# Patient Record
Sex: Female | Born: 1975 | Race: White | Hispanic: No | Marital: Married | Smoking: Never smoker
Health system: Southern US, Community
[De-identification: ages and names within clinical notes are randomized; demographics above are authoritative.]

## PROBLEM LIST (undated history)

## (undated) DIAGNOSIS — E669 Obesity, unspecified: Secondary | ICD-10-CM

## (undated) DIAGNOSIS — F419 Anxiety disorder, unspecified: Secondary | ICD-10-CM

## (undated) DIAGNOSIS — E282 Polycystic ovarian syndrome: Secondary | ICD-10-CM

## (undated) DIAGNOSIS — J45909 Unspecified asthma, uncomplicated: Secondary | ICD-10-CM

## (undated) DIAGNOSIS — I10 Essential (primary) hypertension: Secondary | ICD-10-CM

## (undated) DIAGNOSIS — E119 Type 2 diabetes mellitus without complications: Secondary | ICD-10-CM

## (undated) DIAGNOSIS — T7840XA Allergy, unspecified, initial encounter: Secondary | ICD-10-CM

## (undated) DIAGNOSIS — G4700 Insomnia, unspecified: Secondary | ICD-10-CM

## (undated) DIAGNOSIS — J329 Chronic sinusitis, unspecified: Secondary | ICD-10-CM

## (undated) HISTORY — DX: Morbid (severe) obesity due to excess calories: E66.01

## (undated) HISTORY — DX: Obesity, unspecified: E66.9

## (undated) HISTORY — DX: Polycystic ovarian syndrome: E28.2

## (undated) HISTORY — DX: Insomnia, unspecified: G47.00

## (undated) HISTORY — DX: Essential (primary) hypertension: I10

## (undated) HISTORY — DX: Chronic sinusitis, unspecified: J32.9

## (undated) HISTORY — PX: CHOLECYSTECTOMY: SHX55

## (undated) HISTORY — DX: Unspecified asthma, uncomplicated: J45.909

## (undated) HISTORY — DX: Type 2 diabetes mellitus without complications: E11.9

## (undated) HISTORY — DX: Anxiety disorder, unspecified: F41.9

## (undated) HISTORY — DX: Allergy, unspecified, initial encounter: T78.40XA

---

## 1997-10-25 ENCOUNTER — Other Ambulatory Visit: Admission: RE | Admit: 1997-10-25 | Discharge: 1997-10-25 | Payer: Self-pay | Admitting: Family Medicine

## 1997-10-29 ENCOUNTER — Other Ambulatory Visit: Admission: RE | Admit: 1997-10-29 | Discharge: 1997-10-29 | Payer: Self-pay | Admitting: Family Medicine

## 2000-11-14 ENCOUNTER — Encounter: Admission: RE | Admit: 2000-11-14 | Discharge: 2000-11-14 | Payer: Self-pay | Admitting: Family Medicine

## 2000-11-14 ENCOUNTER — Encounter: Payer: Self-pay | Admitting: Family Medicine

## 2001-10-09 ENCOUNTER — Other Ambulatory Visit: Admission: RE | Admit: 2001-10-09 | Discharge: 2001-10-09 | Payer: Self-pay | Admitting: Obstetrics and Gynecology

## 2002-11-03 ENCOUNTER — Other Ambulatory Visit: Admission: RE | Admit: 2002-11-03 | Discharge: 2002-11-03 | Payer: Self-pay | Admitting: Obstetrics and Gynecology

## 2005-10-31 ENCOUNTER — Other Ambulatory Visit: Admission: RE | Admit: 2005-10-31 | Discharge: 2005-10-31 | Payer: Self-pay | Admitting: Family Medicine

## 2007-12-25 ENCOUNTER — Other Ambulatory Visit: Admission: RE | Admit: 2007-12-25 | Discharge: 2007-12-25 | Payer: Self-pay | Admitting: Family Medicine

## 2011-03-06 ENCOUNTER — Other Ambulatory Visit: Payer: Self-pay | Admitting: Family Medicine

## 2011-03-06 DIAGNOSIS — Z1231 Encounter for screening mammogram for malignant neoplasm of breast: Secondary | ICD-10-CM

## 2011-04-02 ENCOUNTER — Ambulatory Visit: Payer: Self-pay

## 2013-02-18 ENCOUNTER — Other Ambulatory Visit: Payer: Self-pay | Admitting: Family Medicine

## 2013-02-18 ENCOUNTER — Other Ambulatory Visit (HOSPITAL_COMMUNITY)
Admission: RE | Admit: 2013-02-18 | Discharge: 2013-02-18 | Disposition: A | Discharge status: Home / Self Care | Payer: BC Managed Care – PPO | Source: Ambulatory Visit | Attending: Family Medicine | Admitting: Family Medicine

## 2013-02-18 DIAGNOSIS — Z0000 Encounter for general adult medical examination without abnormal findings: Secondary | ICD-10-CM | POA: Insufficient documentation

## 2014-06-08 ENCOUNTER — Ambulatory Visit
Admission: RE | Admit: 2014-06-08 | Discharge: 2014-06-08 | Disposition: A | Discharge status: Home / Self Care | Payer: BC Managed Care – PPO | Source: Ambulatory Visit | Attending: Family Medicine | Admitting: Family Medicine

## 2014-06-08 ENCOUNTER — Other Ambulatory Visit: Payer: Self-pay | Admitting: Family Medicine

## 2014-06-08 DIAGNOSIS — J189 Pneumonia, unspecified organism: Secondary | ICD-10-CM

## 2014-10-22 LAB — HM DIABETES EYE EXAM

## 2015-05-04 ENCOUNTER — Encounter: Payer: Self-pay | Admitting: Internal Medicine

## 2015-06-06 ENCOUNTER — Encounter: Payer: Self-pay | Admitting: Internal Medicine

## 2015-06-07 ENCOUNTER — Other Ambulatory Visit (INDEPENDENT_AMBULATORY_CARE_PROVIDER_SITE_OTHER): Payer: BC Managed Care – PPO | Admitting: *Deleted

## 2015-06-07 ENCOUNTER — Other Ambulatory Visit: Payer: Self-pay | Admitting: *Deleted

## 2015-06-07 ENCOUNTER — Ambulatory Visit (INDEPENDENT_AMBULATORY_CARE_PROVIDER_SITE_OTHER): Payer: BC Managed Care – PPO | Admitting: Internal Medicine

## 2015-06-07 ENCOUNTER — Encounter: Payer: Self-pay | Admitting: Internal Medicine

## 2015-06-07 VITALS — BP 122/88 | HR 99 | Temp 98.3°F | Resp 12 | Ht 65.5 in | Wt 343.0 lb

## 2015-06-07 DIAGNOSIS — E1165 Type 2 diabetes mellitus with hyperglycemia: Secondary | ICD-10-CM

## 2015-06-07 DIAGNOSIS — Z794 Long term (current) use of insulin: Secondary | ICD-10-CM | POA: Insufficient documentation

## 2015-06-07 LAB — POCT GLYCOSYLATED HEMOGLOBIN (HGB A1C): HEMOGLOBIN A1C: 9.3

## 2015-06-07 MED ORDER — GLUCOSE BLOOD VI STRP: ORAL_STRIP | Status: DC

## 2015-06-07 MED ORDER — BASAGLAR KWIKPEN 100 UNIT/ML ~~LOC~~ SOPN: 18.0000 [IU] | PEN_INJECTOR | Freq: Every day | SUBCUTANEOUS | Status: DC

## 2015-06-07 MED ORDER — BAYER MICROLET LANCETS MISC
Status: DC
Start: 2015-06-07 — End: 2015-06-07

## 2015-06-07 NOTE — Progress Notes (Signed)
Patient ID: Vicki Rivera, female   DOB: Aug 08, 1975, 40 y.o.   MRN: [id number]  HPI: Vicki Rivera is a 40 y.o.-year-old female, referred by her PCP, Dr.Smith, for management of DM2, dx in 2015, non-insulin-dependent, uncontrolled, without complications.  Last hemoglobin A1c was: 12/21/2014: 7.1%  Pt is on a regimen of: - Trulicity 0.75 mcg every other week  She tried Victoza >> N/V/AP and pancreatitis reportedly She tried Trulicity 0.75 mg weekly >> N/V She tried Invokana >> AP and severe nausea. She tried Metformin >> severe N/AP/D  Pt checks her sugars 1x a day and they are: - am: 180-220 - 2h after b'fast: n/c - before lunch: n/c - 2h after lunch: n/c - before dinner: n/c - 2h after dinner: n/c - bedtime: n/c - nighttime: n/c No lows. Lowest sugar was 130; she has hypoglycemia awareness at 130. Highest sugar was 230.  Glucometer: Freestyle  Pt's meals are - low dairy, no meat, low caffeine (green tea), no sodas - plant based: - Breakfast: whole oats + sugar + fruit - Lunch: rice + beans or potatoes + cheese, greens - Dinner: rice + beans or potatoes + cheese, greens or tofu + veggies - Snacks: apple sauce, fruit, potato chips in peanut oil  - no CKD, last BUN/creatinine:  No results found for: BUN, CREATININE  On Lisinopril. - last set of lipids: No results found for: CHOL, HDL, LDLCALC, LDLDIRECT, TRIG, CHOLHDL  On Crestor.  - last eye exam was in 10/22/2014. No DR.  - no numbness and tingling in her feet.  Pt has FH of DM in mother, sister, MGM.  She also has HTN, PCOS.  ROS: Constitutional: + weight gain, + fatigue, + subjective hyperthermia, + excessive urination Eyes: + blurry vision, no xerophthalmia ENT: no sore throat, no nodules palpated in throat, + occasional dysphagia/no odynophagia, no hoarseness Cardiovascular: no CP/+ SOB/no palpitations/+ leg swelling Respiratory: + all: cough/SOB/wheezing Gastrointestinal: + N/+ V/no D/C Musculoskeletal: + both:  muscle/joint aches Skin: + rash, + easy bruising, + itching, + excessive hair growth Neurological: no tremors/numbness/tingling/dizziness Psychiatric: + both: depression/anxiety + low libido  Past Medical History  Diagnosis Date  . Allergy   . Asthma   . PCOS (polycystic ovarian syndrome)   . Obesity   . Hypertension   . Anxiety   . Insomnia   . Diabetes mellitus without complication (HCC)     DM Type II   . Sinusitis    Past Surgical History  Procedure Laterality Date  . Cholecystectomy     Social History   Social History  . Marital Status: Single    Spouse Name: N/A  . Number of Children: 0   Social History Main Topics  . Smoking status: Never Smoker   . Smokeless tobacco: Not on file  . Alcohol Use: 0.0 oz/week    0 Standard drinks or equivalent per week  . Drug Use: Not on file   Social History Narrative   Single (female partner-8 yrs)   0 children   Exercise- 2 times weekly   Caffeine use - 3-4 servings daily   College Graduate - [address] [employer] ([employer])    Occup - [employer]   Current Outpatient Prescriptions on File Prior to Visit  Medication Sig Dispense Refill  . albuterol (VENTOLIN HFA) 108 (90 Base) MCG/ACT inhaler Inhale 2 puffs into the lungs every 6 (six) hours as needed.     . desvenlafaxine (PRISTIQ) 100 MG 24 hr tablet  Take 100 mg by mouth daily.     . Dulaglutide (TRULICITY) 0.75 MG/0.5ML SOPN Inject 0.5 mg into the skin once a week.     . fluticasone (FLONASE) 50 MCG/ACT nasal spray Place 2 sprays into both nostrils daily.     . Fluticasone-Salmeterol (ADVAIR DISKUS) 100-50 MCG/DOSE AEPB Inhale 1 puff into the lungs.     Marland Kitchen glucose blood (FREESTYLE INSULINX TEST) test strip 1 each by Other route 2 (two) times daily.     . Insulin Pen Needle (BD PEN NEEDLE NANO U/F) 32G X 4 MM MISC Use to inject insulin.    . Lancets (FREESTYLE) lancets 1 each by Other route 2 (two) times daily.     Marland Kitchen  levocetirizine (XYZAL) 5 MG tablet Take 5 mg by mouth every evening.     Marland Kitchen lisinopril-hydrochlorothiazide (PRINZIDE,ZESTORETIC) 10-12.5 MG tablet Take 1 tablet by mouth daily.     . montelukast (SINGULAIR) 5 MG chewable tablet Chew 10 mg by mouth at bedtime.     . rosuvastatin (CRESTOR) 20 MG tablet Take 20 mg by mouth daily.      No current facility-administered medications on file prior to visit.   Allergies  Allergen Reactions  . Amoxicillin-Pot Clavulanate Hives  . Benzonatate Other (See Comments)  . Canagliflozin Nausea Only  . Ciprofloxacin Hives  . Clarithromycin Other (See Comments)  . Ceftin  [Cefuroxime] Hives  . Emetrol Nausea Only  . Metformin Nausea Only  . Sulfa Antibiotics Hives   Family History  Problem Relation Age of Onset  . Hypertension Mother   . Diabetes Mother   . Hyperlipidemia Mother   . Parkinson's disease Mother   . Hyperlipidemia Father   . Diabetes Sister     TYPE I   . Diabetes Maternal Grandmother   . Dementia Maternal Grandfather   . Heart disease Paternal Grandfather     CAD; MI    PE: BP 122/88 mmHg  Pulse 99  Temp(Src) 98.3 F (36.8 C) (Oral)  Resp 12  Ht 5' 5.5" (1.664 m)  Wt 343 lb (155.584 kg)  BMI 56.19 kg/m2  SpO2 98% Wt Readings from Last 3 Encounters:  06/07/15 343 lb (155.584 kg)   Constitutional: Obesity class 3, in NAD Eyes: PERRLA, EOMI, no exophthalmos ENT: moist mucous membranes, no thyromegaly, no cervical lymphadenopathy Cardiovascular: RRR, No MRG Respiratory: CTA B Gastrointestinal: abdomen soft, NT, ND, BS+ Musculoskeletal: no deformities, strength intact in all 4 Skin: moist, warm, no rashes Neurological: no tremor with outstretched hands, DTR normal in all 4  ASSESSMENT: 1. DM2, insulin-dependent, uncontrolled, without complications  PLAN:  1. Patient with long-standing, uncontrolled diabetes, on a low dose GLP1 R agonist every 2 weeks. She has had a lot of intolerances to DM meds over 6 mo and her DM  control has deteriorated in thie period- we checked HbA1c >> 9.3% (higher). Her diet has improved in last year (antiinflammatory diet - LEAP), but sugars in am (she only checks then) are 180 (with Trulicity)-220 (without Trulicity). Due to her many medication intolerances and the fact that we need to avoid any further insult of her GI tract, I suggested to start basal insulin, without mealtime coverage for now, since I suspect that her sugars stay high throughout the rest of the day, but without a staircase effect. - I suggested to:  Patient Instructions  Please stop Trulicity.  Start Basaglar 18 units at bedtime.  If sugars in 5 days not lower than 150, you can increase the dose  to 22 units. If sugars in 5 days not lower than 150, you can increase the dose to 26 units. If sugars in 5 days not lower than 150, you can increase the dose to 30 units.  Check sugars 2x a day, rotating check times.  Please return in 1.5 months with your sugar log.   - Strongly advised her to start checking sugars at different times of the day - check 2 times a day, rotating checks - we gave her a Bayer Contour meter - given sugar log and advised how to fill it and to bring it at next appt  - given foot care handout and explained the principles  - given instructions for hypoglycemia management "15-15 rule"  - advised for yearly eye exams  - Return to clinic in 1.5 mo with sugar log

## 2015-06-07 NOTE — Patient Instructions (Signed)
Please stop Trulicity.  Start Basaglar 18 units at bedtime.  If sugars in 5 days not lower than 150, you can increase the dose to 22 units. If sugars in 5 days not lower than 150, you can increase the dose to 26 units. If sugars in 5 days not lower than 150, you can increase the dose to 30 units.  Check sugars 2x a day, rotating check times.  Please return in 1.5 months with your sugar log.   PATIENT INSTRUCTIONS FOR TYPE 2 DIABETES:  **Please join MyChart!** - see attached instructions about how to join if you have not done so already.  DIET AND EXERCISE Diet and exercise is an important part of diabetic treatment.  We recommended aerobic exercise in the form of brisk walking (working between 40-60% of maximal aerobic capacity, similar to brisk walking) for 150 minutes per week (such as 30 minutes five days per week) along with 3 times per week performing 'resistance' training (using various gauge rubber tubes with handles) 5-10 exercises involving the major muscle groups (upper body, lower body and core) performing 10-15 repetitions (or near fatigue) each exercise. Start at half the above goal but build slowly to reach the above goals. If limited by weight, joint pain, or disability, we recommend daily walking in a swimming pool with water up to waist to reduce pressure from joints while allow for adequate exercise.    BLOOD GLUCOSES Monitoring your blood glucoses is important for continued management of your diabetes. Please check your blood glucoses 2-4 times a day: fasting, before meals and at bedtime (you can rotate these measurements - e.g. one day check before the 3 meals, the next day check before 2 of the meals and before bedtime, etc.).   HYPOGLYCEMIA (low blood sugar) Hypoglycemia is usually a reaction to not eating, exercising, or taking too much insulin/ other diabetes drugs.  Symptoms include tremors, sweating, hunger, confusion, headache, etc. Treat IMMEDIATELY with 15 grams  of Carbs: . 4 glucose tablets .  cup regular juice/soda . 2 tablespoons raisins . 4 teaspoons sugar . 1 tablespoon honey Recheck blood glucose in 15 mins and repeat above if still symptomatic/blood glucose <100.  RECOMMENDATIONS TO REDUCE YOUR RISK OF DIABETIC COMPLICATIONS: * Take your prescribed MEDICATION(S) * Follow a DIABETIC diet: Complex carbs, fiber rich foods, (monounsaturated and polyunsaturated) fats * AVOID saturated/trans fats, high fat foods, >2,300 mg salt per day. * EXERCISE at least 5 times a week for 30 minutes or preferably daily.  * DO NOT SMOKE OR DRINK more than 1 drink a day. * Check your FEET every day. Do not wear tightfitting shoes. Contact us if you develop an ulcer * See your EYE doctor once a year or more if needed * Get a FLU shot once a year * Get a PNEUMONIA vaccine once before and once after age 32 years  GOALS:  * Your Hemoglobin A1c of <7%  * fasting sugars need to be <130 * after meals sugars need to be <180 (2h after you start eating) * Your Systolic BP should be 140 or lower  * Your Diastolic BP should be 80 or lower  * Your HDL (Good Cholesterol) should be 40 or higher  * Your LDL (Bad Cholesterol) should be 100 or lower. * Your Triglycerides should be 150 or lower  * Your Urine microalbumin (kidney function) should be <30 * Your Body Mass Index should be 25 or lower    Please consider the following ways to cut  down carbs and fat and increase fiber and micronutrients in your diet: - substitute whole grain for white bread or pasta - substitute brown rice for white rice - substitute 90-calorie flat bread pieces for slices of bread when possible - substitute sweet potatoes or yams for white potatoes - substitute humus for margarine - substitute tofu for cheese when possible - substitute almond or rice milk for regular milk (would not drink soy milk daily due to concern for soy estrogen influence on breast cancer risk) - substitute dark  chocolate for other sweets when possible - substitute water - can add lemon or orange slices for taste - for diet sodas (artificial sweeteners will trick your body that you can eat sweets without getting calories and will lead you to overeating and weight gain in the long run) - do not skip breakfast or other meals (this will slow down the metabolism and will result in more weight gain over time)  - can try smoothies made from fruit and almond/rice milk in am instead of regular breakfast - can also try old-fashioned (not instant) oatmeal made with almond/rice milk in am - order the dressing on the side when eating salad at a restaurant (pour less than half of the dressing on the salad) - eat as little meat as possible - can try juicing, but should not forget that juicing will get rid of the fiber, so would alternate with eating raw veg./fruits or drinking smoothies - use as little oil as possible, even when using olive oil - can dress a salad with a mix of balsamic vinegar and lemon juice, for e.g. - use agave nectar, stevia sugar, or regular sugar rather than artificial sweateners - steam or broil/roast veggies  - snack on veggies/fruit/nuts (unsalted, preferably) when possible, rather than processed foods - reduce or eliminate aspartame in diet (it is in diet sodas, chewing gum, etc) Read the labels!  Try to read Dr. Katherina RightNeal Barnard's book: "Program for Reversing Diabetes" for other ideas for healthy eating.

## 2015-06-21 ENCOUNTER — Other Ambulatory Visit: Payer: Self-pay | Admitting: *Deleted

## 2015-06-21 MED ORDER — ONETOUCH ULTRA SYSTEM W/DEVICE KIT: PACK | Status: DC

## 2015-06-21 MED ORDER — ONETOUCH DELICA LANCETS 33G MISC: Status: DC

## 2015-06-21 MED ORDER — GLUCOSE BLOOD VI STRP: ORAL_STRIP | Status: DC

## 2015-06-21 NOTE — Telephone Encounter (Signed)
Ins does not cover Bayer products any longer. They cover OneTouch Verio and Ultra products. Sent OneTouch Ultra device, strips and lancets to pt's pharmacy.

## 2015-07-06 ENCOUNTER — Telehealth: Payer: Self-pay | Admitting: Internal Medicine

## 2015-07-06 MED ORDER — INSULIN DEGLUDEC 100 UNIT/ML ~~LOC~~ SOPN: 18.0000 [IU] | PEN_INJECTOR | Freq: Every day | SUBCUTANEOUS | Status: DC

## 2015-07-06 NOTE — Telephone Encounter (Signed)
Called pt and lvm (at her home # - unable to reach her via work #). Advised her that we are sending Vicki Rivera, same dose to her pharmacy. Advised her that we are not sure about the heelo app. Advised if she had any further questions to please call our office.

## 2015-07-06 NOTE — Telephone Encounter (Signed)
We can try to send [place]iba same dose. I am not sure about the heelo app.Marland Kitchen..Marland Kitchen

## 2015-07-06 NOTE — Telephone Encounter (Signed)
Pt thinks she is having a reaction to the insulin she is using, she is having facial flushing and a possible rash forming. Psychologist, sport and exercise(Basaglar)  Pt also says she is trying to add you to her heelo app? She said they are requiring a log in and password from us [facility] # [phone]

## 2015-07-06 NOTE — Telephone Encounter (Signed)
Please read message below and advise.  

## 2015-07-22 ENCOUNTER — Ambulatory Visit (INDEPENDENT_AMBULATORY_CARE_PROVIDER_SITE_OTHER): Payer: BC Managed Care – PPO | Admitting: Internal Medicine

## 2015-07-22 ENCOUNTER — Encounter: Payer: Self-pay | Admitting: Internal Medicine

## 2015-07-22 VITALS — BP 114/70 | HR 98 | Temp 98.1°F | Resp 12 | Wt 340.0 lb

## 2015-07-22 DIAGNOSIS — Z794 Long term (current) use of insulin: Secondary | ICD-10-CM | POA: Diagnosis not present

## 2015-07-22 DIAGNOSIS — E1165 Type 2 diabetes mellitus with hyperglycemia: Secondary | ICD-10-CM

## 2015-07-22 MED ORDER — INSULIN DEGLUDEC 100 UNIT/ML ~~LOC~~ SOPN: 35.0000 [IU] | PEN_INJECTOR | Freq: Every day | SUBCUTANEOUS | Status: DC

## 2015-07-22 MED ORDER — GLIPIZIDE 5 MG PO TABS: 5.0000 mg | ORAL_TABLET | Freq: Two times a day (BID) | ORAL | Status: DC

## 2015-07-22 NOTE — Patient Instructions (Signed)
Please increase Tresiba to 35 units at bedtime. Start Glipizide 5 mg 2x a day, 15 min before b'fast and dinner. If sugars at bedtime are still high, please increase Glipizide with dinner to 10 mg.  Please return in 1.5 months with your sugar log.

## 2015-07-22 NOTE — Progress Notes (Signed)
Patient ID: Vicki Rivera, female   DOB: 03-31-1976, 40 y.o.   MRN: [id number]  HPI: Vicki Rivera is a 40 y.o.-year-old female, initially referred by her PCP, Dr.Smith, for management of DM2, dx in 2015, non-insulin-dependent, uncontrolled, without complications. Last appt 1.5 mo ago.  Last hemoglobin A1c was: Lab Results  Component Value Date   HGBA1C 9.3 06/07/2015   12/21/2014: 7.1%  Pt was on a regimen of: - Trulicity 0.10 mcg every other week  We switched to: - Tresiba 18 >> 30 units daily at night (developed a facial rash with Basaglar, now improving)  She tried Victoza >> N/V/AP and pancreatitis reportedly She tried Trulicity 9.32 mg weekly >> N/V She tried Invokana >> AP and severe nausea. She tried Metformin >> severe N/AP/D  Pt checks her sugars 1x a day and they are: - am: 180-220 >> 168-298, 338 - 2h after b'fast: n/c - before lunch: n/c >> 167, 198 - 2h after lunch: n/c >> 185, 291 - before dinner: n/c >> 176, 184-288 - 2h after dinner: n/c >> 309, 367 - bedtime: n/c >> 210, 338 - nighttime: n/c No lows. Lowest sugar was 130; she has hypoglycemia awareness at 130. Highest sugar was 230.  Glucometer: Freestyle >> [employer]  Pt's meals are - low dairy, no meat, low caffeine (green tea), no sodas - plant based - now only cheese is animal - Breakfast: whole oats + sugar + fruit - Lunch: rice + beans or potatoes + cheese, greens - Dinner: rice + beans or potatoes + cheese, greens or tofu + veggies - Snacks: apple sauce, fruit, potato chips in peanut oil Her diet has improved in last year (antiinflammatory diet - LEAP)  - no CKD, last BUN/creatinine:  01/18/2014: BUN/Cr 9/0.66, eGFR 99 No results found for: BUN, CREATININE  On Lisinopril. - last set of lipids: 128/181/36/56 No results found for: CHOL, HDL, LDLCALC, LDLDIRECT, TRIG, CHOLHDL  On Crestor.  - last eye exam was in 10/22/2014. No DR.  - no numbness and tingling in her feet.  She also has HTN,  PCOS.  ROS: Constitutional: no weight gain/loss, no fatigue, no subjective hyperthermia/hypothermia Eyes: no blurry vision, no xerophthalmia ENT: no sore throat, no nodules palpated in throat, no dysphagia/odynophagia, no hoarseness Cardiovascular: no CP/SOB/palpitations/leg swelling Respiratory: no cough/SOB Gastrointestinal: no N/V/D/C Musculoskeletal: no muscle/joint aches Skin: + rash on face Neurological: no tremors/numbness/tingling/dizziness  I reviewed pt's medications, allergies, PMH, social hx, family hx, and changes were documented in the history of present illness. Otherwise, unchanged from my initial visit note.  Past Medical History  Diagnosis Date  . Allergy   . Asthma   . PCOS (polycystic ovarian syndrome)   . Obesity   . Hypertension   . Anxiety   . Insomnia   . Diabetes mellitus without complication ([place])     DM Type II   . Sinusitis    Past Surgical History  Procedure Laterality Date  . Cholecystectomy     Social History   Social History  . Marital Status: Single    Spouse Name: N/A  . Number of Children: 0   Social History Main Topics  . Smoking status: Never Smoker   . Smokeless tobacco: Not on file  . Alcohol Use: 0.0 oz/week    0 Standard drinks or equivalent per week  . Drug Use: Not on file   Social History Narrative   Single (female partner-8 yrs)   0 children   Exercise- 2 times weekly  Caffeine use - 3-4 servings daily   [employer] ([employer])    [employer]   Current Outpatient Prescriptions on File Prior to Visit  Medication Sig Dispense Refill  . albuterol (VENTOLIN HFA) 108 (90 Base) MCG/ACT inhaler Inhale 2 puffs into the lungs every 6 (six) hours as needed.     . Blood Glucose Monitoring Suppl (ONE TOUCH ULTRA SYSTEM KIT) w/Device KIT Use to test blood sugar daily. 1 each 0  . desvenlafaxine (PRISTIQ) 100 MG 24 hr tablet Take 100 mg by  mouth daily.     . fluticasone (FLONASE) 50 MCG/ACT nasal spray Place 2 sprays into both nostrils daily.     . Fluticasone-Salmeterol (ADVAIR DISKUS) 100-50 MCG/DOSE AEPB Inhale 1 puff into the lungs.     Marland Kitchen glucose blood (ONE TOUCH ULTRA TEST) test strip Use to test 2 times daily as instructed. 100 each 5  . Insulin Degludec (TRESIBA FLEXTOUCH) 100 UNIT/ML SOPN Inject 18 Units into the skin at bedtime. 15 mL 1  . Insulin Pen Needle (BD PEN NEEDLE NANO U/F) 32G X 4 MM MISC Use to inject insulin.    . Lancets (FREESTYLE) lancets 1 each by Other route 2 (two) times daily.     Marland Kitchen levocetirizine (XYZAL) 5 MG tablet Take 5 mg by mouth every evening.     Marland Kitchen lisinopril-hydrochlorothiazide (PRINZIDE,ZESTORETIC) 10-12.5 MG tablet Take 1 tablet by mouth daily.     . montelukast (SINGULAIR) 5 MG chewable tablet Chew 10 mg by mouth at bedtime.     Glory Rosebush DELICA LANCETS 49S MISC Use to test 2 times daily as instructed. 100 each 5  . rosuvastatin (CRESTOR) 20 MG tablet Take 20 mg by mouth daily.      No current facility-administered medications on file prior to visit.   Allergies  Allergen Reactions  . Amoxicillin-Pot Clavulanate Hives  . Benzonatate Other (See Comments)  . Canagliflozin Nausea Only  . Ciprofloxacin Hives  . Clarithromycin Other (See Comments)  . Ceftin  [Cefuroxime] Hives  . Emetrol Nausea Only  . Metformin Nausea Only  . Sulfa Antibiotics Hives   Family History  Problem Relation Age of Onset  . Hypertension Mother   . Diabetes Mother   . Hyperlipidemia Mother   . Parkinson's disease Mother   . Hyperlipidemia Father   . Diabetes Sister     TYPE I   . Diabetes Maternal Grandmother   . Dementia Maternal Grandfather   . Heart disease Paternal Grandfather     CAD; MI    PE: BP 114/70 mmHg  Pulse 98  Temp(Src) 98.1 F (36.7 C) (Oral)  Resp 12  Wt 340 lb (154.223 kg)  SpO2 97% Wt Readings from Last 3 Encounters:  07/22/15 340 lb (154.223 kg)  06/07/15 343 lb  (155.584 kg)   Constitutional: Obesity class 3, in NAD, + facial rash Eyes: PERRLA, EOMI, no exophthalmos ENT: moist mucous membranes, no thyromegaly, no cervical lymphadenopathy Cardiovascular: RRR, No MRG Respiratory: CTA B Gastrointestinal: abdomen soft, NT, ND, BS+ Musculoskeletal: no deformities, strength intact in all 4 Skin: moist, warm, no rashes Neurological: no tremor with outstretched hands, DTR normal in all 4  ASSESSMENT: 1. DM2, insulin-dependent, uncontrolled, without complications  PLAN:  1. Patient with uncontrolled diabetes, with a lot of intolerances to DM meds - her DM control has deteriorated in thie period (last HbA1c >> 9.3%). We started long acting insulin at last visit, but we also  had to stop the GLP1 R agonist 2/2 N/V. Sugars are still high...  Will try Glipizide >> if not helping >> will need mealtime insulin - will also increase Tresiba  - I suggested to:  Patient Instructions  Please increase Tresiba to 35 units at bedtime. Start Glipizide 5 mg 2x a day, 15 min before b'fast and dinner. If sugars at bedtime are still high, please increase Glipizide with dinner to 10 mg.  Please return in 1.5 months with your sugar log.   - Strongly advised her to start checking sugars at different times of the day - check 2 times a day, rotating checks - we gave her a Bayer Contour meter at last visit - advised for yearly eye exams  - at next visit will check Lipids and CMP if not checked by PCP - pt plans to schedule f/u appt with Dr Tamala Julian - Return to clinic in 1.5 mo with sugar log

## 2015-07-29 ENCOUNTER — Telehealth: Payer: Self-pay | Admitting: Internal Medicine

## 2015-07-29 MED ORDER — INSULIN ASPART 100 UNIT/ML FLEXPEN: PEN_INJECTOR | SUBCUTANEOUS | Status: DC

## 2015-07-29 MED ORDER — INSULIN PEN NEEDLE 32G X 4 MM MISC: Status: DC

## 2015-07-29 NOTE — Telephone Encounter (Signed)
Called pt and advised her per Dr Charlean SanfilippoGherghe's note. Pt voiced understanding. Sent Novolog along with pen needles to pt's pharmacy.

## 2015-07-29 NOTE — Telephone Encounter (Signed)
Please read message below and advise.  

## 2015-07-29 NOTE — Addendum Note (Signed)
Addended by: Adline MangoALLICUTT, Reg Bircher B on: 07/29/2015 02:19 PM   Modules accepted: Orders, Medications

## 2015-07-29 NOTE — Telephone Encounter (Signed)
Unfortunately, she is intolerant to other oral medicines that may help with sugars after meals, so we will need to start mealtime insulin.  We can try to send NovoLog - take it 15 minutes before her 3 main meals: - 5 units before a smaller meal - 7 units before a larger meal We can send one box of pens with 2 refills. She may also need more pen needles. Please let me know about the sugars in few days.

## 2015-07-29 NOTE — Telephone Encounter (Signed)
Pt started the glipizide and it makes her very sick please advise on what to do

## 2015-08-30 ENCOUNTER — Telehealth: Payer: Self-pay | Admitting: Internal Medicine

## 2015-08-30 MED ORDER — INSULIN DEGLUDEC 100 UNIT/ML ~~LOC~~ SOPN: 35.0000 [IU] | PEN_INJECTOR | Freq: Every day | SUBCUTANEOUS | Status: DC

## 2015-08-30 NOTE — Telephone Encounter (Signed)
Pt calling to get rx for the tresiba with the increase dosing call into [facility] in [place] please

## 2015-08-30 NOTE — Telephone Encounter (Signed)
Refill sent to pt's pharmacy. 

## 2015-09-08 ENCOUNTER — Ambulatory Visit: Payer: BC Managed Care – PPO | Admitting: Internal Medicine

## 2015-09-21 ENCOUNTER — Encounter: Payer: Self-pay | Admitting: Internal Medicine

## 2015-09-21 NOTE — Progress Notes (Unsigned)
Received labs from 09/12/2015, from PCP: - CMP normal, except glucose 205. BUN/creatinine 6/0.64, EGFR 102. - HbA1c 10.6% - ACR less than 30

## 2015-10-28 LAB — HM DIABETES EYE EXAM

## 2015-11-01 ENCOUNTER — Ambulatory Visit (INDEPENDENT_AMBULATORY_CARE_PROVIDER_SITE_OTHER): Payer: BC Managed Care – PPO | Admitting: Internal Medicine

## 2015-11-01 ENCOUNTER — Encounter: Payer: Self-pay | Admitting: Internal Medicine

## 2015-11-01 VITALS — BP 132/88 | HR 95 | Ht 65.5 in | Wt 341.0 lb

## 2015-11-01 DIAGNOSIS — E1165 Type 2 diabetes mellitus with hyperglycemia: Secondary | ICD-10-CM

## 2015-11-01 DIAGNOSIS — Z794 Long term (current) use of insulin: Secondary | ICD-10-CM

## 2015-11-01 MED ORDER — INSULIN DEGLUDEC 100 UNIT/ML ~~LOC~~ SOPN: 38.0000 [IU] | PEN_INJECTOR | Freq: Every day | SUBCUTANEOUS | 2 refills | Status: DC

## 2015-11-01 MED ORDER — INSULIN ASPART 100 UNIT/ML FLEXPEN: PEN_INJECTOR | SUBCUTANEOUS | 2 refills | Status: DC

## 2015-11-01 NOTE — Progress Notes (Signed)
Patient ID: Clema Skousen, female   DOB: 1975-08-11, 40 y.o.   MRN: [id number]  HPI: Tanisa Lagace is a 40 y.o.-year-old female, initially referred by her PCP, Dr.Smith, for management of DM2, dx in 2015, insulin-dependent, uncontrolled, without complications. Last appt 3 mo ago.  She moved and changed job >> increased stress >> sugars higher lately, but slightly better after starting mealtime insulin.  Last hemoglobin A1c was: Received labs from 09/12/2015, from PCP: - HbA1c 10.6% Lab Results  Component Value Date   HGBA1C 9.3 06/07/2015  12/21/2014: 7.1%  Pt was on a regimen of: - Trulicity 4.09 mcg every other week  We switched to: - Tresiba 18 >> 30 >> 32 units daily at night - NovoLog 8 units with meals - started 07/29/2015  She tried Victoza >> N/V/AP and pancreatitis reportedly She tried Trulicity 8.11 mg weekly >> N/V She tried Invokana >> AP and severe nausea. 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 Lowest sugar was 130 >> 147; she has hypoglycemia awareness at 130. Highest sugar was 230 >> 270.  Glucometer: Freestyle >> [employer]  Pt's meals are - low dairy, no meat, low caffeine (green tea), no sodas - plant based - now only cheese is animal - Breakfast: whole oats + sugar + fruit - Lunch: rice + beans or potatoes + cheese, greens - Dinner: rice + beans or potatoes + cheese, greens or tofu + veggies - Snacks: apple sauce, fruit, potato chips in peanut oil Her diet has improved in last year (antiinflammatory  diet - LEAP)  Walking more lately.  - no CKD, last BUN/creatinine:  Received labs from 09/12/2015, from PCP: - CMP normal, except glucose 205. BUN/creatinine 6/0.64, EGFR 102. - ACR less than 30 01/18/2014: BUN/Cr 9/0.66, eGFR 99 No results found for: BUN, CREATININE  On Lisinopril. - last set of lipids: 09/12/2015: 129/160/43/54 128/181/36/56 No results found for: CHOL, HDL, LDLCALC, LDLDIRECT, TRIG, CHOLHDL  On Crestor.  - last eye exam was in 10/2015 (Dr. Valetta Close) No DR.  - no numbness and tingling in her feet.  She also has HTN, PCOS.  ROS: Constitutional: no weight gain/loss, no fatigue, no subjective hyperthermia/hypothermia Eyes: no blurry vision, no xerophthalmia ENT: no sore throat, no nodules palpated in throat, no dysphagia/odynophagia, no hoarseness Cardiovascular: no CP/SOB/palpitations/leg swelling Respiratory: no cough/SOB Gastrointestinal: no N/V/D/C Musculoskeletal: no muscle/joint aches Skin: no rash Neurological: no tremors/numbness/tingling/dizziness  I reviewed pt's medications, allergies, PMH, social hx, family hx, and changes were documented in the history of present illness. Otherwise, unchanged from my initial visit note.  Past Medical History:  Diagnosis Date  . Allergy   . Anxiety   . Asthma   . Diabetes mellitus without complication ([place])    DM Type II   . Hypertension   . Insomnia   . Obesity   . PCOS (polycystic ovarian syndrome)   . Sinusitis    Past Surgical History:  Procedure Laterality  Date  . CHOLECYSTECTOMY     Social History   Social History  . Marital Status: Single    Spouse Name: N/A  . Number of Children: 0   Social History Main Topics  . Smoking status: Never Smoker   . Smokeless tobacco: Not on file  . Alcohol Use: 0.0 oz/week    0 Standard drinks or equivalent per week  . Drug Use: Not on file   Social History Narrative   Single (female partner-8 yrs)   0 children   Exercise- 2 times weekly   Caffeine use  - 3-4 servings daily   [employer]    Occup - [employer], [employer]   Current Outpatient Prescriptions on File Prior to Visit  Medication Sig Dispense Refill  . albuterol (VENTOLIN HFA) 108 (90 Base) MCG/ACT inhaler Inhale 2 puffs into the lungs every 6 (six) hours as needed.     . Blood Glucose Monitoring Suppl (ONE TOUCH ULTRA SYSTEM KIT) w/Device KIT Use to test blood sugar daily. 1 each 0  . desvenlafaxine (PRISTIQ) 100 MG 24 hr tablet Take 100 mg by mouth daily.     . fluticasone (FLONASE) 50 MCG/ACT nasal spray Place 2 sprays into both nostrils daily.     . Fluticasone-Salmeterol (ADVAIR DISKUS) 100-50 MCG/DOSE AEPB Inhale 1 puff into the lungs.     Marland Kitchen glucose blood (ONE TOUCH ULTRA TEST) test strip Use to test 2 times daily as instructed. 100 each 5  . insulin aspart (NOVOLOG FLEXPEN) 100 UNIT/ML FlexPen Inject 5 units with a smaller meal and 7 units with a larger meal. 15 mL 2  . Insulin Degludec (TRESIBA FLEXTOUCH) 100 UNIT/ML SOPN Inject 35 Units into the skin at bedtime. 15 mL 1  . Insulin Pen Needle (BD PEN NEEDLE NANO U/F) 32G X 4 MM MISC Use to inject insulin 4 times daily. 130 each 5  . Lancets (FREESTYLE) lancets 1 each by Other route 2 (two) times daily.     Marland Kitchen levocetirizine (XYZAL) 5 MG tablet Take 5 mg by mouth every evening.     Marland Kitchen lisinopril-hydrochlorothiazide (PRINZIDE,ZESTORETIC) 10-12.5 MG tablet Take 1 tablet by mouth daily.     . montelukast (SINGULAIR) 5 MG chewable tablet Chew 10 mg by mouth at bedtime.     Glory Rosebush DELICA LANCETS 40X MISC Use to test 2 times daily as instructed. 100 each 5  . rosuvastatin (CRESTOR) 20 MG tablet Take 20 mg by mouth daily.      No current facility-administered medications on file prior to visit.    Allergies  Allergen Reactions  . Amoxicillin-Pot Clavulanate Hives  . Benzonatate Other (See Comments)  . Canagliflozin Nausea Only  . Ceftin   [Cefuroxime] Hives  . Ciprofloxacin Hives  . Clarithromycin Other (See Comments)  . Emetrol Nausea Only  . Metformin Nausea Only  . Sulfa Antibiotics Hives   Family History  Problem Relation Age of Onset  . Hypertension Mother   . Diabetes Mother   . Hyperlipidemia Mother   . Parkinson's disease Mother   . Hyperlipidemia Father   . Diabetes Sister     TYPE I   . Diabetes Maternal Grandmother   . Dementia Maternal Grandfather   . Heart disease Paternal Grandfather     CAD; MI    PE: BP 132/88 (BP Location: Left Arm, Patient Position: Sitting)   Pulse 95   Ht 5' 5.5" (1.664 m)   Wt (!) 341  lb (154.7 kg)   LMP 11/01/2015   SpO2 97%   BMI 55.88 kg/m  Wt Readings from Last 3 Encounters:  11/01/15 (!) 341 lb (154.7 kg)  07/22/15 (!) 340 lb (154.2 kg)  06/07/15 (!) 343 lb (155.6 kg)   Constitutional: Obesity class 3, in NAD, + facial rash Eyes: PERRLA, EOMI, no exophthalmos ENT: moist mucous membranes, no thyromegaly, no cervical lymphadenopathy Cardiovascular: RRR, No MRG Respiratory: CTA B Gastrointestinal: abdomen soft, NT, ND, BS+ Musculoskeletal: no deformities, strength intact in all 4 Skin: moist, warm, no rashes Neurological: no tremor with outstretched hands, DTR normal in all 4  ASSESSMENT: 1. DM2, insulin-dependent, uncontrolled, without long term complications, but with hyperglycemia  PLAN:  1. Patient with uncontrolled diabetes, with a lot of intolerances to DM meds - her DM control has deteriorated in thie period (last HbA1c >> 9.3%). We started long acting insulin before last visit, and added rapid-acting insulin after last visit (07/2015) due to uncontrolled sugars - last HbA1c was 10.6% 1.5 mo ago. Sugars are still high >> will increase [place] and Novolog and give her a sliding scale - I suggested to:  Patient Instructions  Please increase: - Tresiba to 38 units - NovoLog: - 8 units before a small meal - 12 units before a regular meal - 16 units  for a large meal - if you go out or have dessert - Add the following sliding scale: 150-175: + 2 units 176-200: + 4 units 201-225: + 5 units >225: + 6 units  Please read: "The Cheese Trap" by Dr. Alyssa Grove  Please return in 1.5 months with your sugar log.   - continue checking sugars at different times of the day - check 2 times a day, rotating checks - advised for yearly eye exams >> she is UTD - Return to clinic in 1.5 mo with sugar log   Philemon Kingdom, MD PhD [facility]

## 2015-11-01 NOTE — Patient Instructions (Addendum)
Please increase: - Tresiba to 38 units - NovoLog: - 8 units before a small meal - 12 units before a regular meal - 16 units for a large meal - if you go out or have dessert - Add the following sliding scale: 150-175: + 2 units 176-200: + 4 units 201-225: + 5 units >225: + 6 units  Please read: "The Cheese Trap" by Dr. Lequita Asal  Please return in 1.5 months with your sugar log.

## 2015-11-04 ENCOUNTER — Other Ambulatory Visit: Payer: Self-pay | Admitting: Internal Medicine

## 2015-11-05 ENCOUNTER — Other Ambulatory Visit: Payer: Self-pay | Admitting: Internal Medicine

## 2015-11-14 ENCOUNTER — Other Ambulatory Visit: Payer: Self-pay

## 2015-11-14 ENCOUNTER — Telehealth: Payer: Self-pay | Admitting: Internal Medicine

## 2015-11-14 NOTE — Telephone Encounter (Signed)
Called and spoke with patient about Evaristo Buryresiba, patient has the rx. I called pharmacy and asked what happened. It was denied the first time, and went through the second time. Patient was calling to make sure it was on her chart, which indeed it was. I advised patient to call us back if she had any other issues.

## 2015-11-14 NOTE — Telephone Encounter (Signed)
Pt calling about her Vicki Rivera, she stated that she went to refill and the pharmacy said it was denied, she wants to know what is going on.

## 2015-11-20 ENCOUNTER — Other Ambulatory Visit: Payer: Self-pay | Admitting: Internal Medicine

## 2016-02-01 ENCOUNTER — Other Ambulatory Visit: Payer: Self-pay | Admitting: Internal Medicine

## 2016-02-07 ENCOUNTER — Encounter: Payer: Self-pay | Admitting: Internal Medicine

## 2016-02-07 ENCOUNTER — Ambulatory Visit (INDEPENDENT_AMBULATORY_CARE_PROVIDER_SITE_OTHER): Payer: BC Managed Care – PPO | Admitting: Internal Medicine

## 2016-02-07 VITALS — BP 124/88 | HR 97 | Ht 65.0 in | Wt 342.0 lb

## 2016-02-07 DIAGNOSIS — Z794 Long term (current) use of insulin: Secondary | ICD-10-CM | POA: Diagnosis not present

## 2016-02-07 DIAGNOSIS — E1165 Type 2 diabetes mellitus with hyperglycemia: Secondary | ICD-10-CM

## 2016-02-07 DIAGNOSIS — Z23 Encounter for immunization: Secondary | ICD-10-CM | POA: Diagnosis not present

## 2016-02-07 LAB — POCT GLYCOSYLATED HEMOGLOBIN (HGB A1C): Hemoglobin A1C: 7.6

## 2016-02-07 MED ORDER — ONETOUCH DELICA LANCETS 33G MISC: 3 refills | Status: AC

## 2016-02-07 MED ORDER — GLUCOSE BLOOD VI STRP: ORAL_STRIP | 3 refills | Status: DC

## 2016-02-07 MED ORDER — INSULIN PEN NEEDLE 32G X 4 MM MISC: 3 refills | Status: DC

## 2016-02-07 MED ORDER — INSULIN DEGLUDEC 100 UNIT/ML ~~LOC~~ SOPN: 38.0000 [IU] | PEN_INJECTOR | Freq: Every day | SUBCUTANEOUS | 5 refills | Status: DC

## 2016-02-07 MED ORDER — INSULIN ASPART 100 UNIT/ML FLEXPEN: PEN_INJECTOR | SUBCUTANEOUS | 3 refills | Status: DC

## 2016-02-07 NOTE — Progress Notes (Signed)
Patient ID: Vicki Rivera, female   DOB: 09-04-1975, 40 y.o.   MRN: [id number]  HPI: Vicki Rivera is a 40 y.o.-year-old female, initially referred by her PCP, Dr.Smith, for management of DM2, dx in 2015, insulin-dependent, uncontrolled, without complications. Last appt 3 mo ago.  Last hemoglobin A1c was: Lab Results  Component Value Date   HGBA1C 7.6 02/07/2016   HGBA1C 9.3 06/07/2015  Received labs from 09/12/2015, from PCP: HbA1c 10.6% 12/21/2014: 7.1%  Pt was on a regimen of: - Trulicity 9.45 mcg every other week  We switched to: - Tresiba 38 units daily - NovoLog: - 8 units before a small meal - 12-14 units before a regular meal - 16 units for a large meal - if you go out or have dessert - Add the following sliding scale: 150-175: + 2 units 176-200: + 4 units 201-225: + 5 units >225: + 6 units  She tried Victoza >> N/V/AP and pancreatitis reportedly She tried Trulicity 0.38 mg weekly >> N/V She tried Invokana >> AP and severe nausea. 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 Lowest sugar was 130 >> 147; she has hypoglycemia awareness at 130. Highest sugar was 230 >> 270.  Glucometer: Freestyle >> [employer]  Pt's meals are - low dairy, no meat, low caffeine (green tea), no sodas - plant based - now only cheese is animal - Breakfast: whole oats + sugar + fruit - Lunch: rice + beans or potatoes + cheese, greens - Dinner: rice + beans or  potatoes + cheese, greens or tofu + veggies - Snacks: apple sauce, fruit, potato chips in peanut oil Her diet has improved in last year (antiinflammatory diet - LEAP)  - no CKD, last BUN/creatinine:  Received labs from 09/12/2015, from PCP: - CMP normal, except glucose 205. BUN/creatinine 6/0.64, EGFR 102. - ACR less than 30 01/18/2014: BUN/Cr 9/0.66, eGFR 99 No results found for: BUN, CREATININE  On Lisinopril. - last set of lipids: 09/12/2015: 129/160/43/54 128/181/36/56 No results found for: CHOL, HDL, LDLCALC, LDLDIRECT, TRIG, CHOLHDL  On Crestor.  - last eye exam was in 10/2015 (Dr. Valetta Close) No DR.  - no numbness and tingling in her feet.  She also has HTN, PCOS.  ROS: Constitutional: no weight gain/loss, + fatigue, no subjective hyperthermia/hypothermia Eyes: no blurry vision, no xerophthalmia ENT: no sore throat, no nodules palpated in throat, no dysphagia/odynophagia, no hoarseness Cardiovascular: no CP/SOB/palpitations/leg swelling Respiratory: no cough/SOB Gastrointestinal: no N/V/D/C Musculoskeletal: + muscle/no joint aches Skin: no rash Neurological: no tremors/numbness/tingling/+ dizziness, + headaches  I reviewed pt's medications, allergies, PMH, social hx, family hx, and changes were documented in the history of present illness. Otherwise, unchanged from my initial visit note.  Past Medical History:  Diagnosis Date  . Allergy   . Anxiety   . Asthma   . Diabetes mellitus without complication ([place])  DM Type II   . Hypertension   . Insomnia   . Obesity   . PCOS (polycystic ovarian syndrome)   . Sinusitis    Past Surgical History:  Procedure Laterality Date  . CHOLECYSTECTOMY     Social History   Social History  . Marital Status: Single    Spouse Name: N/A  . Number of Children: 0   Social History Main Topics  . Smoking status: Never Smoker   . Smokeless tobacco: Not on file  . Alcohol Use: 0.0 oz/week    0 Standard drinks or equivalent per  week  . Drug Use: Not on file   Social History Narrative   Single (female partner-8 yrs)   0 children   Exercise- 2 times weekly   Caffeine use - 3-4 servings daily   [employer]    Occup - [employer], [employer]   Current Outpatient Prescriptions on File Prior to Visit  Medication Sig Dispense Refill  . albuterol (VENTOLIN HFA) 108 (90 Base) MCG/ACT inhaler Inhale 2 puffs into the lungs every 6 (six) hours as needed.     . BD PEN NEEDLE NANO U/F 32G X 4 MM MISC USE TO INJECT INSULIN 4 TIMES DAILY. 100 each 2  . Blood Glucose Monitoring Suppl (ONE TOUCH ULTRA SYSTEM KIT) w/Device KIT Use to test blood sugar daily. 1 each 0  . desvenlafaxine (PRISTIQ) 100 MG 24 hr tablet Take 100 mg by mouth daily.     . fluticasone (FLONASE) 50 MCG/ACT nasal spray Place 2 sprays into both nostrils daily.     . Fluticasone-Salmeterol (ADVAIR DISKUS) 100-50 MCG/DOSE AEPB Inhale 1 puff into the lungs.     Marland Kitchen glucose blood (ONE TOUCH ULTRA TEST) test strip Use to test 2 times daily as instructed. 100 each 5  . Insulin Degludec (TRESIBA FLEXTOUCH) 100 UNIT/ML SOPN Inject 38 Units into the skin at bedtime. 15 mL 2  . Lancets (FREESTYLE) lancets 1 each by Other route 2 (two) times daily.     Marland Kitchen levocetirizine (XYZAL) 5 MG tablet Take 5 mg by mouth every evening.     Marland Kitchen lisinopril-hydrochlorothiazide (PRINZIDE,ZESTORETIC) 10-12.5 MG tablet Take 1 tablet by mouth daily.     . montelukast (SINGULAIR) 5 MG chewable tablet Chew 10 mg by mouth at bedtime.     Marland Kitchen NOVOLOG FLEXPEN 100 UNIT/ML FlexPen INJECT 5 UNITS WITH A SMALLER MEAL AND 7 UNITS WITH A LARGER MEAL. 15 mL 2  . ONETOUCH DELICA LANCETS 02R MISC Use to test 2 times daily as instructed. 100 each 5  . rosuvastatin (CRESTOR) 20 MG tablet Take 20 mg by mouth daily.     . TRESIBA FLEXTOUCH 100 UNIT/ML SOPN INJECT 35 UNITS INTO THE SKIN AT BEDTIME. 15 pen 1   No current  facility-administered medications on file prior to visit.    Allergies  Allergen Reactions  . Amoxicillin-Pot Clavulanate Hives  . Benzonatate Other (See Comments)  . Canagliflozin Nausea Only  . Ceftin  [Cefuroxime] Hives  . Ciprofloxacin Hives  . Clarithromycin Other (See Comments)  . Emetrol Nausea Only  . Metformin Nausea Only  . Sulfa Antibiotics Hives   Family History  Problem Relation Age of Onset  . Hypertension Mother   . Diabetes Mother   . Hyperlipidemia Mother   . Parkinson's disease Mother   . Hyperlipidemia Father   . Diabetes Sister     TYPE I   . Diabetes Maternal Grandmother   .  Dementia Maternal Grandfather   . Heart disease Paternal Grandfather     CAD; MI    PE: BP 124/88 (BP Location: Left Arm, Patient Position: Sitting)   Pulse 97   Ht _0  (1.651 m)   Wt (!) 342 lb (155.1 kg)   LMP 01/16/2016   SpO2 98%   BMI 56.91 kg/m  Wt Readings from Last 3 Encounters:  02/07/16 (!) 342 lb (155.1 kg)  11/01/15 (!) 341 lb (154.7 kg)  07/22/15 (!) 340 lb (154.2 kg)   Constitutional: Obesity class 3, in NAD, + facial rash Eyes: PERRLA, EOMI, no exophthalmos ENT: moist mucous membranes, no thyromegaly, no cervical lymphadenopathy Cardiovascular: RRR, No MRG Respiratory: CTA B Gastrointestinal: abdomen soft, NT, ND, BS+ Musculoskeletal: no deformities, strength intact in all 4 Skin: moist, warm, no rashes Neurological: no tremor with outstretched hands, DTR normal in all 4  ASSESSMENT: 1. DM2, insulin-dependent, uncontrolled, without long term complications, but with hyperglycemia  PLAN:  1. Patient with uncontrolled diabetes, with a lot of intolerances to DM meds - her DM control has deteriorated before last visit - last HbA1c was 10.6% in 09/2015, However, now much improved after increasing [place] back and NovoLog at last visit  And giving her a sliding scale. She still has high sugars in a.m., and is difficult to tell whether these are caused by not  insulin with dinner or not enough long-acting insulin. Therefore, I advised her to start checking her sugars at bedtime or 2 hours after dinner and, if high, increase the insulin with dinner, and is not high, increase her basal insulin. - I suggested to:  Patient Instructions  Please continue: - Tresiba 38 units daily - NovoLog: - 8 units before a small meal - 12 units before a regular meal - 16 units for a large meal - if you go out or have dessert - Add the following sliding scale: 150-175: + 2 units 176-200: + 4 units 201-225: + 5 units >225: + 6 units  Please check sugars after dinner and increase the insulin before dinner if they are consistently > ~170.  If the sugars after dinner are the same or lower than in am >> increase Tresiba by ~ 4 units.  Please return in 3 months with your sugar log.   - continue checking sugars at different times of the day - check 2-3 times a day, rotating checks - advised for yearly eye exams >> she is UTD - Refilled her prescriptions - HbA1c checked today is much improved: 7.6% - Return to clinic in 3 mo with sugar log   Philemon Kingdom, MD PhD [facility]

## 2016-02-07 NOTE — Patient Instructions (Addendum)
Please continue: - Tresiba 38 units daily - NovoLog: - 8 units before a small meal - 12 units before a regular meal - 16 units for a large meal - if you go out or have dessert - Add the following sliding scale: 150-175: + 2 units 176-200: + 4 units 201-225: + 5 units >225: + 6 units  Please check sugars after dinner and increase the insulin before dinner if they are consistently > ~170.  If the sugars after dinner are the same or lower than in am >> increase Tresiba by ~ 4 units.  Please return in 3 months with your sugar log.

## 2016-02-13 ENCOUNTER — Other Ambulatory Visit: Payer: Self-pay | Admitting: Internal Medicine

## 2016-03-05 ENCOUNTER — Telehealth: Payer: Self-pay | Admitting: Internal Medicine

## 2016-03-05 NOTE — Telephone Encounter (Signed)
Pt needs us to redo the rx for the pen needles for 4 needles daily [facility] [address] [facility] [address] [facility]

## 2016-03-06 ENCOUNTER — Other Ambulatory Visit: Payer: Self-pay

## 2016-03-06 MED ORDER — INSULIN PEN NEEDLE 32G X 4 MM MISC: 3 refills | Status: DC

## 2016-03-06 NOTE — Telephone Encounter (Signed)
Please address asap

## 2016-03-06 NOTE — Telephone Encounter (Signed)
Refill submitted. 

## 2016-03-06 NOTE — Telephone Encounter (Signed)
Refill submitted for pen needles for 4 times per day.

## 2016-03-28 ENCOUNTER — Other Ambulatory Visit: Payer: Self-pay | Admitting: Internal Medicine

## 2016-05-24 ENCOUNTER — Other Ambulatory Visit: Payer: Self-pay | Admitting: Internal Medicine

## 2016-06-06 ENCOUNTER — Other Ambulatory Visit: Payer: Self-pay | Admitting: Internal Medicine

## 2016-11-14 ENCOUNTER — Other Ambulatory Visit: Payer: Self-pay | Admitting: Internal Medicine

## 2016-11-23 ENCOUNTER — Other Ambulatory Visit: Payer: Self-pay | Admitting: Internal Medicine

## 2016-11-29 ENCOUNTER — Other Ambulatory Visit: Payer: Self-pay

## 2016-11-29 MED ORDER — BASAGLAR KWIKPEN 100 UNIT/ML ~~LOC~~ SOPN: PEN_INJECTOR | SUBCUTANEOUS | 2 refills | Status: DC

## 2016-11-29 NOTE — Telephone Encounter (Signed)
I contacted the patient and left a voicemail advising the Vicki Rivera is not covered by the insurance and Dr. Elvera LennoxGherghe has agreed to change to basaglar 35 units a bed time. [facility]lar [facility] [place], patient advised to call back if she has any further questions.

## 2016-12-04 ENCOUNTER — Other Ambulatory Visit (HOSPITAL_COMMUNITY)
Admission: RE | Admit: 2016-12-04 | Discharge: 2016-12-04 | Disposition: A | Discharge status: Home / Self Care | Payer: 59 | Source: Ambulatory Visit | Attending: Family Medicine | Admitting: Family Medicine

## 2016-12-04 ENCOUNTER — Other Ambulatory Visit: Payer: Self-pay | Admitting: Family Medicine

## 2016-12-04 DIAGNOSIS — Z01419 Encounter for gynecological examination (general) (routine) without abnormal findings: Secondary | ICD-10-CM | POA: Diagnosis not present

## 2016-12-05 ENCOUNTER — Other Ambulatory Visit: Payer: Self-pay

## 2016-12-05 MED ORDER — INSULIN LISPRO 100 UNIT/ML (KWIKPEN): PEN_INJECTOR | SUBCUTANEOUS | 4 refills | Status: DC

## 2016-12-06 ENCOUNTER — Telehealth: Payer: Self-pay | Admitting: Pediatrics

## 2016-12-06 LAB — CYTOLOGY - PAP: Diagnosis: NEGATIVE

## 2016-12-06 NOTE — Telephone Encounter (Signed)
[facility]'s Tresiba. I called [employer] to initiate PA for patient. I was informed patient's coverage ended 09/29/2016.  I called patient to see what type insurance she currently has and get the information. LMTCB on mobile, Home# is disconnected.

## 2016-12-06 NOTE — Telephone Encounter (Signed)
Returning missed call. In work meeting starting 1pm.  Ty,  -LL

## 2016-12-14 ENCOUNTER — Other Ambulatory Visit: Payer: Self-pay

## 2016-12-14 ENCOUNTER — Ambulatory Visit: Payer: BC Managed Care – PPO | Admitting: Internal Medicine

## 2016-12-20 ENCOUNTER — Other Ambulatory Visit: Payer: Self-pay

## 2016-12-20 MED ORDER — INSULIN GLARGINE 100 UNIT/ML SOLOSTAR PEN: PEN_INJECTOR | SUBCUTANEOUS | 1 refills | Status: DC

## 2017-01-11 ENCOUNTER — Encounter: Payer: Self-pay | Admitting: Internal Medicine

## 2017-01-11 ENCOUNTER — Ambulatory Visit (INDEPENDENT_AMBULATORY_CARE_PROVIDER_SITE_OTHER): Payer: 59 | Admitting: Internal Medicine

## 2017-01-11 VITALS — BP 108/70 | HR 89 | Temp 98.4°F | Wt 356.5 lb

## 2017-01-11 DIAGNOSIS — E1165 Type 2 diabetes mellitus with hyperglycemia: Secondary | ICD-10-CM | POA: Diagnosis not present

## 2017-01-11 DIAGNOSIS — Z794 Long term (current) use of insulin: Secondary | ICD-10-CM

## 2017-01-11 DIAGNOSIS — Z6841 Body Mass Index (BMI) 40.0 and over, adult: Secondary | ICD-10-CM | POA: Diagnosis not present

## 2017-01-11 MED ORDER — TOUJEO MAX SOLOSTAR 300 UNIT/ML ~~LOC~~ SOPN: 30.0000 [IU] | PEN_INJECTOR | Freq: Two times a day (BID) | SUBCUTANEOUS | 5 refills | Status: DC

## 2017-01-11 NOTE — Progress Notes (Signed)
Patient ID: Vicki Rivera, female   DOB: 1976/01/04, 41 y.o.   MRN: [id number]  HPI: Vicki Rivera is a 41 y.o.-year-old female, initially referred by her PCP, Dr.Smith, for management of DM2, dx in 2015, insulin-dependent, uncontrolled, without long term complications. Last appt 11 mo ago.   Since last visit, sugars are much worse. Also, she gained 14 lbs. She increased her insulins working with her sister who is a diabetic Marine scientist.  Last hemoglobin A1c was: 12/04/2016: HbA1c 10.1% Lab Results  Component Value Date   HGBA1C 7.6 02/07/2016   HGBA1C 9.3 06/07/2015  Received labs from 09/12/2015, from PCP: HbA1c 10.6% 12/21/2014: 7.1%  She is on: - Basaglar 38 >> 52 units daily - Humalog: 20-22 units before meals   She tried Victoza >> N/V/AP and pancreatitis reportedly She tried Trulicity 8.24 mg weekly >> N/V She tried Invokana >> AP and severe nausea. 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, agricultural >> developed a facial rash (but unclear if from the Bai drinks she was drinking then)  Pt checks her sugars 2x a day and they are: - am: 180-220 >> 168-298, 338 >> 169, 181-268 >> 134-185, 205 >> 196-250 - 2h after b'fast: n/c - before lunch: n/c >> 167, 198 >> 161, 181-262 >> 96-120, 174 >> 252 - 2h after lunch: n/c >> 185, 291 >> 190 >> n/c - before dinner: 147, 169, 188-270 >> 124-153, 252 (forgot Novolog) >> 147, 212-312 - 2h after dinner: n/c >> 309, 367 >> n/c >> 263 - bedtime: n/c >> 210, 338 >> 234 >> 278-327, 358 - nighttime: n/c No lows. Lowest sugar was 130 >> 147; she has hypoglycemia awareness at 130. Highest sugar was 230 >> 270 >> 358.  Glucometer: Freestyle >> [employer]  Pt's meals are mostly plant based + cheese. She was following an antiinflammatory diet - LEAP.  -  No CKD, last BUN/creatinine:  09/12/2015, from PCP: - CMP normal, except glucose 205. BUN/creatinine 6/0.64, EGFR 102. - ACR less than 30 01/18/2014: BUN/Cr 9/0.66, eGFR 99 No results found  for: BUN, CREATININE  On Lisinopril.  - last set of lipids: 09/12/2015: 129/160/43/54 128/181/36/56 No results found for: CHOL, HDL, LDLCALC, LDLDIRECT, TRIG, CHOLHDL  On Crestor.  - last eye exam was in 09/2016 >> No DR (Dr. Valetta Close)  - denies numbness and tingling in her feet.  She also has HTN, PCOS.  ROS: Constitutional: + weight gain, + fatigue, + hot flushes Eyes: + blurry vision, no xerophthalmia ENT: no sore throat, no nodules palpated in throat, no dysphagia, no odynophagia, no hoarseness Cardiovascular: no CP/no SOB/no palpitations/no leg swelling Respiratory: no cough/no SOB/+ wheezing (asthma) Gastrointestinal: no N/no V/no D/no C/no acid reflux Musculoskeletal: no muscle aches/no joint aches Skin: + rash, + hair loss Neurological: no tremors/no numbness/no tingling/no dizziness, + HA  I reviewed pt's medications, allergies, PMH, social hx, family hx, and changes were documented in the history of present illness. Otherwise, unchanged from my initial visit note.  Past Medical History:  Diagnosis Date  . Allergy   . Anxiety   . Asthma   . Diabetes mellitus without complication ([place])    DM Type II   . Hypertension   . Insomnia   . Obesity   . PCOS (polycystic ovarian syndrome)   . Sinusitis    Past Surgical History:  Procedure Laterality Date  . CHOLECYSTECTOMY     Social History   Social History  . Marital Status: Single    Spouse Name:  N/A  . Number of Children: 0   Social History Main Topics  . Smoking status: Never Smoker   . Smokeless tobacco: Not on file  . Alcohol Use: 0.0 oz/week    0 Standard drinks or equivalent per week  . Drug Use: Not on file   Social History Narrative   Single (female partner-8 yrs)   0 children   Exercise- 2 times weekly   Caffeine use - 3-4 servings daily   [place] [employer]    Occup - [employer], [employer]   Current Outpatient  Prescriptions on File Prior to Visit  Medication Sig Dispense Refill  . albuterol (VENTOLIN HFA) 108 (90 Base) MCG/ACT inhaler Inhale 2 puffs into the lungs every 6 (six) hours as needed.     . BD PEN NEEDLE NANO U/F 32G X 4 MM MISC USE TO INJECT INSULIN 4 TIMES DAILY. 300 each 3  . Blood Glucose Monitoring Suppl (ONE TOUCH ULTRA SYSTEM KIT) w/Device KIT Use to test blood sugar daily. 1 each 0  . desvenlafaxine (PRISTIQ) 100 MG 24 hr tablet Take 100 mg by mouth daily.     . fluticasone (FLONASE) 50 MCG/ACT nasal spray Place 2 sprays into both nostrils daily.     . Fluticasone-Salmeterol (ADVAIR DISKUS) 100-50 MCG/DOSE AEPB Inhale 1 puff into the lungs.     Marland Kitchen glucose blood (ONE TOUCH ULTRA TEST) test strip Use to test 3 times daily as instructed. 300 each 3  . insulin degludec (TRESIBA FLEXTOUCH) 100 UNIT/ML SOPN FlexTouch Pen Inject 35 units into the skin at bedtime. 15 pen 2  . Insulin Glargine (LANTUS SOLOSTAR) 100 UNIT/ML Solostar Pen Inject 35 units into the skin at bedtime. 15 pen 1  . insulin lispro (HUMALOG KWIKPEN) 100 UNIT/ML KiwkPen Inject 5 units with a small meal and 7 units with a larger meal 15 pen 4  . levocetirizine (XYZAL) 5 MG tablet Take 5 mg by mouth every evening.     Marland Kitchen lisinopril-hydrochlorothiazide (PRINZIDE,ZESTORETIC) 10-12.5 MG tablet Take 1 tablet by mouth daily.     . montelukast (SINGULAIR) 5 MG chewable tablet Chew 10 mg by mouth at bedtime.     Marland Kitchen NOVOLOG FLEXPEN 100 UNIT/ML FlexPen INJECT 5 UNITS WITH A SMALLER MEAL AND 7 UNITS WITH A LARGER MEAL. 45 pen 1  . ONETOUCH DELICA LANCETS 47W MISC Use to test 3 times daily as instructed. 300 each 3  . rosuvastatin (CRESTOR) 20 MG tablet Take 20 mg by mouth daily.      No current facility-administered medications on file prior to visit.    Allergies  Allergen Reactions  . Amoxicillin-Pot Clavulanate Hives  . Benzonatate Other (See Comments)  . Canagliflozin Nausea Only  . Ceftin  [Cefuroxime] Hives  . Ciprofloxacin  Hives  . Clarithromycin Other (See Comments)  . Emetrol Nausea Only  . Metformin Nausea Only  . Sulfa Antibiotics Hives   Family History  Problem Relation Age of Onset  . Hypertension Mother   . Diabetes Mother   . Hyperlipidemia Mother   . Parkinson's disease Mother   . Hyperlipidemia Father   . Diabetes Sister        TYPE I   . Diabetes Maternal Grandmother   . Dementia Maternal Grandfather   . Heart disease Paternal Grandfather        CAD; MI    PE: BP 108/70 (BP Location: Left Arm, Patient Position: Sitting, Cuff Size: Large)   Pulse 89  Temp 98.4 F (36.9 C) (Oral)   Wt (!) 356 lb 8 oz (161.7 kg)   SpO2 98%   BMI 59.32 kg/m  Wt Readings from Last 3 Encounters:  01/11/17 (!) 356 lb 8 oz (161.7 kg)  02/07/16 (!) 342 lb (155.1 kg)  11/01/15 (!) 341 lb (154.7 kg)   Constitutional: obese, in NAD Eyes: PERRLA, EOMI, no exophthalmos ENT: moist mucous membranes, no thyromegaly, no cervical lymphadenopathy Cardiovascular: RRR, No MRG Respiratory: CTA B Gastrointestinal: abdomen soft, NT, ND, BS+ Musculoskeletal: no deformities, strength intact in all 4 Skin: moist, warm, no rashes Neurological: no tremor with outstretched hands, DTR normal in all 4  ASSESSMENT: 1. DM2, insulin-dependent, uncontrolled, without long term complications, but with hyperglycemia  2. Obesity class 3  PLAN:  1. Patient with uncontrolled diabetes, With a lot of intolerances to medicines, now only on basal-bolus insulin regimen. She returns after a long absence, and at last visit she had a much improved HbA1c, at 7.6%, decreased from 10.6%. She is not sure why, but her sugars are a lot worse now despite not changing much in her diet. She has increased both long and rapid acting insulin since last visit with only minimum effect. - We reviewed her sugar log at this visit and indeed sugars are usually in the 200s to 300s  - we discussed about possibly retrying a GLP-1 receptor agonist, an SGLT2  inhibitor, or metformin, which she refuses due to previous intolerances  - I strongly advised her to start changing her diet, which is obviously confusing to increase insulin resistance. Please see pb #2, also - For now, we will increase her long-acting insulin and hopefully we can change to toujeo which is 3 times more concentrated than the U100 insulin and allows her to inject lower volumes of insulin. At next visit, we can also change Humalog U200.  - We also discussed about U500 insulin which we may need to use in the future  -  We also addressed the possibility of of an insulin pump, but only after we changed to U500 - I suggested to:  Patient Instructions  Please switch from [place] and increase the dose: - Toujeo 30 units in am and 40 units at night  Continue Humalog: - 20-26 units before each meal, 3x a day  Please look up "The Engine 2 diet" by [employer].  Please return in 3 months with your sugar log.   - continue checking sugars at different times of the day - check 3x a day, rotating checks - advised for yearly eye exams >> she is UTD - we will check her annual labs at next visit, after diabetes improves - she already had the flu shot this season - Return to clinic in 3 mo with sugar log   2. Obesity class 3 - We discussed at length about this problem today, and reviewed her previous weights. She gained 14 pounds since last visit and her sugars are now out of control. She does not admit to overeating and she feels that she is doing a fair job with not eating many processed foods and staying active. She feels that her weight may be genetics, but we discussed that epigenetic factors are usually more important and can be modified. - She would not like to contemplate bariatric surgery - She is also opposed to dieting or weight loss medications  - She is reticent to be referred to a weight loss program, but finally accepts to be referred to the  [facility] at  [facility]. -  I strongly advised her to try to change her diet since this is definitely continue seeing to weight gain and worsening of diabetes. I did suggest a full plan based diet (she is mostly plant-based, but also eats cheese) and given references   - time spent with the patient: 40 min, of which >50% was spent in reviewing her sugar log (she brought a good record of her sugars), reviewing  previous labs and insulin doses, discussing about options to improve her weight and developing a plan to avoid hypo- and hyper-glycemia. Please also see the discussed topics above.  Philemon Kingdom, MD PhD [facility] Endocrinology

## 2017-01-11 NOTE — Patient Instructions (Addendum)
Please switch from Basaglar and increase the dose: - Toujeo 30 units in am and 40 units at night  Continue Humalog: - 20-26 units before each meal, 3x a day  Please look up "The Engine 2 diet" by [employer].  Please return in 3 months with your sugar log.

## 2017-01-25 ENCOUNTER — Telehealth: Payer: Self-pay

## 2017-01-25 ENCOUNTER — Other Ambulatory Visit: Payer: Self-pay

## 2017-01-25 MED ORDER — TOUJEO MAX SOLOSTAR 300 UNIT/ML ~~LOC~~ SOPN: 30.0000 [IU] | PEN_INJECTOR | Freq: Two times a day (BID) | SUBCUTANEOUS | 5 refills | Status: DC

## 2017-01-25 NOTE — Telephone Encounter (Signed)
Patient called stating she was given a discount card for Toujeo and was supposed to be switched to it 30 units in the am and 40 units pm but she has not been able to pick this prescription up wants to know if it can be sent now-

## 2017-01-25 NOTE — Telephone Encounter (Signed)
Submitted for the new instructions.

## 2017-02-04 ENCOUNTER — Other Ambulatory Visit: Payer: Self-pay

## 2017-02-04 MED ORDER — INSULIN GLARGINE 100 UNIT/ML SOLOSTAR PEN: PEN_INJECTOR | SUBCUTANEOUS | 1 refills | Status: DC

## 2017-02-05 ENCOUNTER — Other Ambulatory Visit: Payer: Self-pay | Admitting: Internal Medicine

## 2017-02-05 ENCOUNTER — Telehealth: Payer: Self-pay

## 2017-02-05 ENCOUNTER — Telehealth: Payer: Self-pay | Admitting: Internal Medicine

## 2017-02-05 ENCOUNTER — Encounter: Payer: Self-pay | Admitting: Internal Medicine

## 2017-02-05 MED ORDER — BASAGLAR KWIKPEN 100 UNIT/ML ~~LOC~~ SOPN: PEN_INJECTOR | SUBCUTANEOUS | 11 refills | Status: DC

## 2017-02-05 NOTE — Telephone Encounter (Signed)
Pt is requesting a call back regarding the message for clarification  Pt was upset about the hold time she states she called 8 times in a row and held a total of 10 min. I apologized for her hold and that we were unable to send her to a nurse's voicemail.

## 2017-02-05 NOTE — Telephone Encounter (Signed)
Called patient and advised that we were unable to get Toujeo, also that they requested an alternative for the Lantus that was sent in, patient is calling pharmacy to ask what is covered. She will call us back to I can let MD know.

## 2017-02-06 NOTE — Telephone Encounter (Signed)
Sent patient message through [employer] message, MD submitted new medicine for [employer].

## 2017-04-16 ENCOUNTER — Encounter: Payer: Self-pay | Admitting: Internal Medicine

## 2017-04-16 ENCOUNTER — Ambulatory Visit (INDEPENDENT_AMBULATORY_CARE_PROVIDER_SITE_OTHER): Payer: Managed Care, Other (non HMO) | Admitting: Internal Medicine

## 2017-04-16 VITALS — BP 136/88 | HR 104 | Ht 65.25 in | Wt 363.6 lb

## 2017-04-16 DIAGNOSIS — Z6841 Body Mass Index (BMI) 40.0 and over, adult: Secondary | ICD-10-CM | POA: Diagnosis not present

## 2017-04-16 DIAGNOSIS — E1165 Type 2 diabetes mellitus with hyperglycemia: Secondary | ICD-10-CM | POA: Diagnosis not present

## 2017-04-16 DIAGNOSIS — Z794 Long term (current) use of insulin: Secondary | ICD-10-CM

## 2017-04-16 LAB — COMPLETE METABOLIC PANEL WITH GFR
AG Ratio: 1.6 (calc) (ref 1.0–2.5)
ALBUMIN MSPROF: 4.1 g/dL (ref 3.6–5.1)
ALT: 22 U/L (ref 6–29)
AST: 23 U/L (ref 10–30)
Alkaline phosphatase (APISO): 42 U/L (ref 33–115)
BUN: 12 mg/dL (ref 7–25)
CALCIUM: 9.6 mg/dL (ref 8.6–10.2)
CO2: 28 mmol/L (ref 20–32)
CREATININE: 0.62 mg/dL (ref 0.50–1.10)
Chloride: 100 mmol/L (ref 98–110)
GFR, EST AFRICAN AMERICAN: 128 mL/min/{1.73_m2} (ref >=60)
GFR, EST NON AFRICAN AMERICAN: 110 mL/min/{1.73_m2} (ref >=60)
GLUCOSE: 162 mg/dL — AB (ref 65–99)
Globulin: 2.5 g/dL (calc) (ref 1.9–3.7)
Potassium: 4.2 mmol/L (ref 3.5–5.3)
Sodium: 136 mmol/L (ref 135–146)
TOTAL PROTEIN: 6.6 g/dL (ref 6.1–8.1)
Total Bilirubin: 0.7 mg/dL (ref 0.2–1.2)

## 2017-04-16 LAB — LIPID PANEL
CHOLESTEROL: 110 mg/dL (ref 0–200)
HDL: 32.4 mg/dL — AB (ref >39.00)
NONHDL: 77.88
TRIGLYCERIDES: 232 mg/dL — AB (ref 0.0–149.0)
Total CHOL/HDL Ratio: 3
VLDL: 46.4 mg/dL — ABNORMAL HIGH (ref 0.0–40.0)

## 2017-04-16 LAB — MICROALBUMIN / CREATININE URINE RATIO
Creatinine,U: 289.7 mg/dL
Microalb Creat Ratio: 2.8 mg/g (ref 0.0–30.0)
Microalb, Ur: 8.2 mg/dL — ABNORMAL HIGH (ref 0.0–1.9)

## 2017-04-16 LAB — POCT GLYCOSYLATED HEMOGLOBIN (HGB A1C): Hemoglobin A1C: 9.3

## 2017-04-16 LAB — LDL CHOLESTEROL, DIRECT: Direct LDL: 58 mg/dL

## 2017-04-16 MED ORDER — INSULIN GLARGINE 300 UNIT/ML ~~LOC~~ SOPN: 30.0000 [IU] | PEN_INJECTOR | Freq: Two times a day (BID) | SUBCUTANEOUS | 3 refills | Status: DC

## 2017-04-16 MED ORDER — INSULIN LISPRO 100 UNIT/ML (KWIKPEN): PEN_INJECTOR | SUBCUTANEOUS | 3 refills | Status: DC

## 2017-04-16 NOTE — Progress Notes (Addendum)
Patient ID: Vicki Rivera, female   DOB: 10/01/1975, 42 y.o.   MRN: [id number]  HPI: Vicki Rivera is a 42 y.o.-year-old female, initially referred by her PCP, Dr.Smith, for management of DM2, dx in 2015, insulin-dependent, uncontrolled, without long term complications. Last appt 3 mo ago.   She has a sinus infection >> ABx >> also Prednisone - week after Christmas. Sugars 300s.  They are now slightly improved.  Last hemoglobin A1c was: 12/04/2016: HbA1c 10.1% Lab Results  Component Value Date   HGBA1C 7.6 02/07/2016   HGBA1C 9.3 06/07/2015  Received labs from 09/12/2015, from PCP: HbA1c 10.6% 12/21/2014: 7.1%  She is on: - Basaglar 30 units in am and 40 in at night - Humalog 20-26 units before each meal, 3x a day   She tried Victoza >> N/V/AP and pancreatitis reportedly She tried Trulicity 3.53 mg weekly >> N/V She tried Invokana >> AP and severe nausea. 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 Lowest sugar was 130 >> 147 >> 154; she has hypoglycemia awareness at 130. Highest sugar was 230 >> 270 >> 358 >> 332.  Glucometer: Freestyle >> [employer]  Pt's meals are mostly plant-based + cheese. She was following an antiinflammatory diet - LEAP.  -  No CKD, last BUN/creatinine:  09/12/2015, from PCP: - CMP normal, except glucose 205. BUN/creatinine 6/0.64, EGFR 102. - ACR less than 30 01/18/2014:  BUN/Cr 9/0.66, eGFR 99 No results found for: BUN, CREATININE  On Lisinopril.  - + HL; last set of lipids: 09/12/2015: 129/160/43/54 128/181/36/56 No results found for: CHOL, HDL, LDLCALC, LDLDIRECT, TRIG, CHOLHDL  On Crestor.  - last eye exam was in 09/2016 >> No DR (Dr. Valetta Close)  - no numbness and tingling in her feet.  She also has HTN, PCOS.  ROS: Constitutional: no weight gain/no weight loss, no fatigue, no subjective hyperthermia, no subjective hypothermia Eyes: no blurry vision, no xerophthalmia ENT: no sore throat, no nodules palpated in throat, no dysphagia, no odynophagia, no hoarseness Cardiovascular: no CP/no palpitations/no leg swelling Respiratory: + Cough, + shortness of breath, + wheezing (sinus infection Gastrointestinal: no N/no V/no D/no C/no acid reflux Musculoskeletal: no muscle aches/no joint aches Skin: no rashes, no hair loss Neurological: no tremors/no numbness/no tingling/no dizziness  I reviewed pt's medications, allergies, PMH, social hx, family hx, and changes were documented in the history of present illness. Otherwise, unchanged from my initial visit note.  Past Medical History:  Diagnosis Date  . Allergy   . Anxiety   . Asthma   . Diabetes mellitus without complication ([place])    DM Type II   . Hypertension   . Insomnia   . Obesity   . PCOS (polycystic ovarian syndrome)   . Sinusitis    Past Surgical History:  Procedure Laterality Date  .  CHOLECYSTECTOMY     Social History   Social History  . Marital Status: Single    Spouse Name: N/A  . Number of Children: 0   Social History Main Topics  . Smoking status: Never Smoker   . Smokeless tobacco: Not on file  . Alcohol Use: 0.0 oz/week    0 Standard drinks or equivalent per week  . Drug Use: Not on file   Social History Narrative   Single (female partner-8 yrs)   0 children   Exercise- 2 times weekly   Caffeine use - 3-4 servings daily   [place] [employer]    Occup - [employer], [employer]   Current Outpatient Medications on File Prior to Visit  Medication Sig Dispense Refill  . albuterol (VENTOLIN HFA) 108 (90 Base) MCG/ACT inhaler Inhale 2 puffs into the lungs every 6 (six) hours as needed.     . BD PEN NEEDLE NANO U/F 32G X 4 MM MISC USE TO INJECT INSULIN 4 TIMES DAILY. 300 each 3  . Blood Glucose Monitoring Suppl (ONE TOUCH ULTRA SYSTEM KIT) w/Device KIT Use to test blood sugar daily. 1 each 0  . budesonide-formoterol (SYMBICORT) 160-4.5 MCG/ACT inhaler Inhale 2 puffs into the lungs 2 (two) times daily.    Marland Kitchen desvenlafaxine (PRISTIQ) 100 MG 24 hr tablet Take 100 mg by mouth daily.     . fluticasone (FLONASE) 50 MCG/ACT nasal spray Place 2 sprays into both nostrils daily.     Marland Kitchen glucose blood (ONE TOUCH ULTRA TEST) test strip Use to test 3 times daily as instructed. 300 each 3  . Insulin Glargine (BASAGLAR KWIKPEN) 100 UNIT/ML SOPN Inject under skin 30 units in am and 40 units at night 5 pen 11  . insulin lispro (HUMALOG KWIKPEN) 100 UNIT/ML KiwkPen Inject 5 units with a small meal and 7 units with a larger meal 15 pen 4  . levocetirizine (XYZAL) 5 MG tablet Take 5 mg by mouth every evening.     Marland Kitchen lisinopril-hydrochlorothiazide (PRINZIDE,ZESTORETIC) 10-12.5 MG tablet Take 1 tablet by mouth daily.     . montelukast (SINGULAIR) 5 MG chewable tablet Chew 10 mg by mouth at bedtime.     Marland Kitchen NOVOLOG FLEXPEN 100 UNIT/ML FlexPen INJECT 5 UNITS WITH A SMALLER MEAL AND 7 UNITS WITH A LARGER MEAL. 45 pen 1  . ONETOUCH DELICA LANCETS 85Y MISC Use to test 3 times daily as instructed. 300 each 3  . rosuvastatin (CRESTOR) 20 MG tablet Take 20 mg by mouth daily.      No current facility-administered medications on file prior to visit.    Allergies  Allergen Reactions  . Amoxicillin-Pot Clavulanate Hives  . Benzonatate Other (See Comments)  . Canagliflozin Nausea Only  . Ceftin  [Cefuroxime] Hives  .  Ciprofloxacin Hives  . Clarithromycin Other (See Comments)  . Emetrol Nausea Only  . Metformin Nausea Only  . Sulfa Antibiotics Hives   Family History  Problem Relation Age of Onset  . Hypertension Mother   . Diabetes Mother   . Hyperlipidemia Mother   . Parkinson's disease Mother   . Hyperlipidemia Father   . Diabetes Sister        TYPE I   . Diabetes Maternal Grandmother   . Dementia Maternal Grandfather   . Heart disease Paternal Grandfather        CAD; MI    PE: BP 136/88   Pulse (!) 104   Ht 5' 5.25" (  1.657 m)   Wt (!) 363 lb 9.6 oz (164.9 kg)   LMP 03/05/2017   SpO2 96%   BMI 60.04 kg/m  Wt Readings from Last 3 Encounters:  04/16/17 (!) 363 lb 9.6 oz (164.9 kg)  01/11/17 (!) 356 lb 8 oz (161.7 kg)  02/07/16 (!) 342 lb (155.1 kg)   Constitutional: obese in NAD Eyes: PERRLA, EOMI, no exophthalmos ENT: moist mucous membranes, no thyromegaly, no cervical lymphadenopathy Cardiovascular: Tachycardia, RR, No MRG Respiratory: CTA B Gastrointestinal: abdomen soft, NT, ND, BS+ Musculoskeletal: no deformities, strength intact in all 4 Skin: moist, warm, no rashes Neurological: no tremor with outstretched hands, DTR normal in all 4  ASSESSMENT: 1. DM2, insulin-dependent, uncontrolled, without long term complications, but with hyperglycemia  2. Obesity class 3  PLAN:  1. Patient with uncontrolled diabetes, with a lot of intolerances to medicines, now only on basal-bolus insulin regimen.  At last visit, we increased her basal insulin and try to switch to [facility], however, this was not covered.  She changed her insurance since last visit so we will send a prescription for Toujeo again, hopefully this would be covered in the new year.   - She refuses to retry a GLP-1 receptor agonist, an SGL T-2 inhibitor, or metformin, due to previous intolerances - Since her sugars are still quite high, mostly in the 200s, we will need to increase her insulin.  We will try to increase  mealtime insulin first as I suspect that her sugars are increasing throughout the day (she is not checking sugars later in the day anymore) at next visit, we probably will also need to increase her basal insulin dose.  We will probably soon the need to change to U500 insulin.  We discussed about this at last visit. - At last visit, we discussed at length about changing her diet.  However, with the holidays, she gained 7 pounds. - I suggested to:  Patient Instructions  Please increase: - Basaglar/Toujeo 30 units in am and 40 in at night  Please increase: - Humalog 26-34 units before each meal, 3x a day  Please return in 3 months with your sugar log.   - today, HbA1c is 9.3 % (better, but still high) - continue checking sugars at different times of the day - check 3x a day, rotating checks - advised for yearly eye exams >> she is UTD  - we will need to check her annual labs at next visit (did not have a lab tech today) - Return to clinic in 3 mo with sugar log   2. Obesity class 3 - gained 7 pounds since last visit over the holidays - At last visit, we discussed at length about this problem.   - She does not usually admit to overeating and she feels that she is doing a fair job with not eating many processed foods and staying active.  She refuses bariatric surgery.  She is also usually opposed to dieting or weight loss medications.  - Overall, this is a difficult/sensitive topic for her - Unfortunately, increasing her insulin dose will not help her weight but we absolutely have to go this route due to her persistently high blood sugars  Component     Latest Ref Rng & Units 04/16/2017  Glucose     65 - 99 mg/dL 162 (H)  BUN     7 - 25 mg/dL 12  Creatinine     0.50 - 1.10 mg/dL 0.62  GFR, Est Non African American     >  OR = 60 mL/min/1.24m 110  GFR, Est African American     > OR = 60 mL/min/1.742m128  BUN/Creatinine Ratio     6 - 22 (calc) NOT APPLICABLE  Sodium     13932 146  mmol/L 136  Potassium     3.5 - 5.3 mmol/L 4.2  Chloride     98 - 110 mmol/L 100  CO2     20 - 32 mmol/L 28  Calcium     8.6 - 10.2 mg/dL 9.6  Total Protein     6.1 - 8.1 g/dL 6.6  Albumin MSPROF     3.6 - 5.1 g/dL 4.1  Globulin     1.9 - 3.7 g/dL (calc) 2.5  AG Ratio     1.0 - 2.5 (calc) 1.6  Total Bilirubin     0.2 - 1.2 mg/dL 0.7  Alkaline phosphatase (APISO)     33 - 115 U/L 42  AST     10 - 30 U/L 23  ALT     6 - 29 U/L 22  Cholesterol     0 - 200 mg/dL 110  Triglycerides     0.0 - 149.0 mg/dL 232.0 (H)  HDL Cholesterol     >39.00 mg/dL 32.40 (L)  VLDL     0.0 - 40.0 mg/dL 46.4 (H)  Total CHOL/HDL Ratio      3  NonHDL      77.88  Microalb, Ur     0.0 - 1.9 mg/dL 8.2 (H)  Creatinine,U     mg/dL 289.7  MICROALB/CREAT RATIO     0.0 - 30.0 mg/g 2.8  Hemoglobin A1C      9.3  Direct LDL     mg/dL 58.0  Glucose high, the rest of CMP being normal. Triglycerides high and HDL low.  LDL at goal. ACR normal.   CrPhilemon KingdomMD PhD [facility]

## 2017-04-16 NOTE — Patient Instructions (Addendum)
Please increase: - Basaglar/Toujeo 30 units in am and 40 in at night  Please increase: - Humalog 26-34 units before each meal, 3x a day  Please return in 3 months with your sugar log.

## 2017-04-17 ENCOUNTER — Telehealth: Payer: Self-pay

## 2017-04-17 NOTE — Telephone Encounter (Signed)
OK, let's try a PA. 

## 2017-04-17 NOTE — Telephone Encounter (Signed)
Received PA request for Toujeo Max Solostar 300unit/ml. Please advise if needed.   WUJ:[id number]

## 2017-04-17 NOTE — Telephone Encounter (Signed)
PA started

## 2017-04-18 MED ORDER — INSULIN DEGLUDEC 200 UNIT/ML ~~LOC~~ SOPN: 30.0000 [IU] | PEN_INJECTOR | Freq: Two times a day (BID) | SUBCUTANEOUS | 0 refills | Status: DC

## 2017-04-18 NOTE — Telephone Encounter (Signed)
LMTCB and medication sent

## 2017-04-18 NOTE — Telephone Encounter (Signed)
Her PA will not go through it states that [employer], Levemir, and [employer] is covered for 90 day supplies.

## 2017-04-18 NOTE — Telephone Encounter (Signed)
Evaristo Buryresiba U200.

## 2017-04-20 ENCOUNTER — Other Ambulatory Visit: Payer: Self-pay | Admitting: Internal Medicine

## 2017-04-26 NOTE — Telephone Encounter (Signed)
Left message on voicemail to call office.  

## 2017-07-15 ENCOUNTER — Encounter: Payer: Self-pay | Admitting: Internal Medicine

## 2017-07-15 ENCOUNTER — Ambulatory Visit (INDEPENDENT_AMBULATORY_CARE_PROVIDER_SITE_OTHER): Payer: Managed Care, Other (non HMO) | Admitting: Internal Medicine

## 2017-07-15 VITALS — BP 122/84 | HR 106 | Ht 65.25 in | Wt 368.6 lb

## 2017-07-15 DIAGNOSIS — Z6841 Body Mass Index (BMI) 40.0 and over, adult: Secondary | ICD-10-CM

## 2017-07-15 DIAGNOSIS — Z794 Long term (current) use of insulin: Secondary | ICD-10-CM | POA: Diagnosis not present

## 2017-07-15 DIAGNOSIS — E1165 Type 2 diabetes mellitus with hyperglycemia: Secondary | ICD-10-CM

## 2017-07-15 DIAGNOSIS — E785 Hyperlipidemia, unspecified: Secondary | ICD-10-CM | POA: Diagnosis not present

## 2017-07-15 LAB — POCT GLYCOSYLATED HEMOGLOBIN (HGB A1C): HEMOGLOBIN A1C: 9

## 2017-07-15 MED ORDER — HUMULIN R U-500 KWIKPEN 500 UNIT/ML ~~LOC~~ SOPN: PEN_INJECTOR | SUBCUTANEOUS | 3 refills | Status: DC

## 2017-07-15 NOTE — Progress Notes (Signed)
Patient ID: Vicki Rivera, female   DOB: 1975-11-12, 42 y.o.   MRN: [id number]  HPI: Vicki Rivera is a 42 y.o.-year-old female, initially referred by her PCP, Dr.Smith, for management of DM2, dx in 2015, insulin-dependent, uncontrolled, without long term complications. Last appt 3 months ago.  She had URI and was on Prednisone in 04/2017. Sugars higher.   Last hemoglobin A1c was: Lab Results  Component Value Date   HGBA1C 9.3 04/16/2017   HGBA1C 7.6 02/07/2016   HGBA1C 9.3 06/07/2015  12/04/2016: HbA1c 10.1% 09/12/2015: HbA1c 10.6% 12/21/2014: 7.1%  She is on: - Basaglar >> U200 Tresiba 30-34 units in a.m. and 40 units at night - Humalog 26-34 units before each meal, 3x a day    She tried Victoza >> N/V/AP and pancreatitis reportedly She tried Trulicity 4.03 mg weekly >> N/V She tried Invokana >> AP and severe nausea. 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 Highest sugar was 332 >> 265.  Glucometer: Freestyle >> [employer]  Pt's meals are mostly plant-based + cheese. She was following an antiinflammatory diet - LEAP.  - No CKD, last BUN/creatinine:  Lab Results  Component Value Date   BUN 12 04/16/2017   CREATININE 0.62 04/16/2017   09/12/2015, from PCP: - CMP normal, except glucose 205. BUN/creatinine 6/0.64, EGFR 102. - ACR less than 30 01/18/2014: BUN/Cr 9/0.66, eGFR 99 On lisinopril.  - + HL; last set of lipids: Lab Results  Component Value Date   CHOL 110 04/16/2017    HDL 32.40 (L) 04/16/2017   LDLDIRECT 58.0 04/16/2017   TRIG 232.0 (H) 04/16/2017   CHOLHDL 3 04/16/2017  09/12/2015: 129/160/43/54 On Crestor.  - last eye exam was in 09/2016: No DR no DR (Dr. Valetta Close)  -No numbness and tingling in her feet.  She also has HTN, PCOS.  ROS: Constitutional: no weight gain/no weight loss, no fatigue, no subjective hyperthermia, no subjective hypothermia Eyes: no blurry vision, no xerophthalmia ENT: no sore throat, no nodules palpated in throat, no dysphagia, no odynophagia, no hoarseness Cardiovascular: no CP/no SOB/no palpitations/no leg swelling Respiratory: no cough/no SOB/no wheezing Gastrointestinal: no N/no V/no D/no C/no acid reflux Musculoskeletal: no muscle aches/no joint aches Skin: no rashes, no hair loss Neurological: no tremors/no numbness/no tingling/no dizziness  I reviewed pt's medications, allergies, PMH, social hx, family hx, and changes were documented in the history of present illness. Otherwise, unchanged from my initial visit note.  Past Medical History:  Diagnosis Date  . Allergy   . Anxiety   . Asthma   . Diabetes mellitus without complication ([place])    DM Type II   . Hypertension   . Insomnia   . Obesity   . PCOS (polycystic ovarian syndrome)   . Sinusitis    Past Surgical History:  Procedure Laterality Date  . CHOLECYSTECTOMY     Social History   Social History  . Marital Status: Single  Spouse Name: N/A  . Number of Children: 0   Social History Main Topics  . Smoking status: Never Smoker   . Smokeless tobacco: Not on file  . Alcohol Use: 0.0 oz/week    0 Standard drinks or equivalent per week  . Drug Use: Not on file   Social History Narrative   Single (female partner-8 yrs)   0 children   Exercise- 2 times weekly   Caffeine use - 3-4 servings daily   [employer]    Occup - [employer], [employer]   Current Outpatient  Medications on File Prior to Visit  Medication Sig Dispense Refill  . albuterol (VENTOLIN HFA) 108 (90 Base) MCG/ACT inhaler Inhale 2 puffs into the lungs every 6 (six) hours as needed.     . BD PEN NEEDLE NANO U/F 32G X 4 MM MISC USE TO INJECT INSULIN 4 TIMES DAILY. 300 each 3  . Blood Glucose Monitoring Suppl (ONE TOUCH ULTRA SYSTEM KIT) w/Device KIT Use to test blood sugar daily. 1 each 0  . budesonide-formoterol (SYMBICORT) 160-4.5 MCG/ACT inhaler Inhale 2 puffs into the lungs 2 (two) times daily.    Marland Kitchen desvenlafaxine (PRISTIQ) 100 MG 24 hr tablet Take 100 mg by mouth daily.     . fluticasone (FLONASE) 50 MCG/ACT nasal spray Place 2 sprays into both nostrils daily.     Marland Kitchen glucose blood (ONE TOUCH ULTRA TEST) test strip Use to test 3 times daily as instructed. 300 each 3  . Insulin Glargine (TOUJEO MAX SOLOSTAR) 300 UNIT/ML SOPN Inject 30-40 Units into the skin 2 (two) times daily. 15 pen 3  . insulin lispro (HUMALOG KWIKPEN) 100 UNIT/ML KiwkPen Inject 26-34 units 3x a day under skin 45 pen 3  . levocetirizine (XYZAL) 5 MG tablet Take 5 mg by mouth every evening.     Marland Kitchen lisinopril-hydrochlorothiazide (PRINZIDE,ZESTORETIC) 10-12.5 MG tablet Take 1 tablet by mouth daily.     . montelukast (SINGULAIR) 5 MG chewable tablet Chew 10 mg by mouth at bedtime.     Glory Rosebush DELICA LANCETS 07P MISC Use to test 3 times daily as instructed. 300 each 3  . rosuvastatin (CRESTOR) 20 MG tablet Take 20 mg by mouth daily.     . TRESIBA FLEXTOUCH 200 UNIT/ML SOPN INJECT 30 UNITS INTO THE SKIN 2 TIMES A DAU 27 pen 0   No current facility-administered medications on file prior to visit.    Allergies  Allergen Reactions  . Amoxicillin-Pot Clavulanate Hives  . Benzonatate Other (See Comments)  . Canagliflozin Nausea Only  . Ceftin  [Cefuroxime] Hives  . Ciprofloxacin Hives  . Clarithromycin Other (See Comments)  . Emetrol Nausea Only  . Metformin Nausea Only  . Sulfa Antibiotics Hives   Family History   Problem Relation Age of Onset  . Hypertension Mother   . Diabetes Mother   . Hyperlipidemia Mother   . Parkinson's disease Mother   . Hyperlipidemia Father   . Diabetes Sister        TYPE I   . Diabetes Maternal Grandmother   . Dementia Maternal Grandfather   . Heart disease Paternal Grandfather        CAD; MI    PE: BP 122/84   Pulse (!) 106   Ht 5' 5.25" (1.657 m)   Wt (!) 368 lb 9.6 oz (167.2 kg)   SpO2 98%   BMI 60.87 kg/m  Wt Readings from Last 3 Encounters:  07/15/17 Marland Kitchen)  368 lb 9.6 oz (167.2 kg)  04/16/17 (!) 363 lb 9.6 oz (164.9 kg)  01/11/17 (!) 356 lb 8 oz (161.7 kg)   Constitutional: overweight, in NAD Eyes: PERRLA, EOMI, no exophthalmos ENT: moist mucous membranes, no thyromegaly, no cervical lymphadenopathy Cardiovascular: tachycardia, RR, No MRG Respiratory: CTA B Gastrointestinal: abdomen soft, NT, ND, BS+ Musculoskeletal: no deformities, strength intact in all 4 Skin: moist, warm, no rashes Neurological: no tremor with outstretched hands, DTR normal in all 4  ASSESSMENT: 1. DM2, insulin-dependent, uncontrolled, without long term complications, but with hyperglycemia  2. Obesity class 3  PLAN:  1. Patient with uncontrolled diabetes, insulin-dependent, with a lot of intolerances to medicines, now only on the basal-bolus insulin regimen.  At last visit, I sent a prescription for Toujeo, since she is on high doses of basal insulin and would benefit from a 3 times more concentrated insulin.  She refused to retry a GLP-1 receptor agonist, and SGLT2 inhibitor, or metformin, due to previous intolerances.  At last visit, since her sugars were mostly in the 200s we increased her mealtime insulin and I advised her to also check sugars later in the day.  - sat this visit, sugars are still high, mostly in the 200s - she only checks in am >> advised her to check before meals and occasionally at bedtime - we need to increase her insulin doses at this visit >> will try  to change to U500 insulin  - given coupon card - if she cannot obtain it >> will increase [place] and Humalog dose and switch to U200 Humalog - I suggested to:  Patient Instructions  Please try to switch to U500 insulin: - 40-50 units before B - 40-50 units before L - 60-70 units before D Inject the insulin 30 min before a meal.  If you cannot get the U500 insulin, change: - Tresiba U200 40 units in a.m. and 40 units at night - Humalog (U200) 30-36 units before each meal, 3x a day  Please return in 3 months with your sugar log.   - today, HbA1c is 9.0% (slightly better, but still very high) - continue checking sugars at different times of the day - check 1x a day, rotating checks - advised for yearly eye exams >> she is UTD - needs labs - Return to clinic in 3 mo with sugar log   2. Obesity class 3 - This is a difficult/sensitive topic for her - We did address this more in detail at last visits.  - Refused bariatric surgery   3. HL - Reviewed latest lipid panel from 04/2017: LDL at goal, but high triglycerides and low HDL - Continues on Crestor without side effects.   Philemon Kingdom, MD PhD [facility]

## 2017-07-15 NOTE — Patient Instructions (Addendum)
Please try to switch to U500 insulin: - 40-50 units before B - 40-50 units before L - 60-70 units before D Inject the insulin 30 min before a meal.  If you cannot get the U500 insulin, change: - Tresiba U200 40 units in a.m. and 40 units at night - Humalog (U200) 30-36 units before each meal, 3x a day  Please return in 3 months with your sugar log.

## 2017-08-05 ENCOUNTER — Other Ambulatory Visit: Payer: Self-pay | Admitting: Internal Medicine

## 2017-08-05 ENCOUNTER — Other Ambulatory Visit: Payer: Self-pay

## 2017-08-05 MED ORDER — GLUCOSE BLOOD VI STRP: ORAL_STRIP | 12 refills | Status: AC

## 2017-10-15 ENCOUNTER — Encounter: Payer: Self-pay | Admitting: Internal Medicine

## 2017-10-15 ENCOUNTER — Ambulatory Visit (INDEPENDENT_AMBULATORY_CARE_PROVIDER_SITE_OTHER): Payer: Managed Care, Other (non HMO) | Admitting: Internal Medicine

## 2017-10-15 VITALS — BP 122/80 | HR 98 | Ht 65.25 in | Wt 377.4 lb

## 2017-10-15 DIAGNOSIS — E785 Hyperlipidemia, unspecified: Secondary | ICD-10-CM | POA: Diagnosis not present

## 2017-10-15 DIAGNOSIS — Z794 Long term (current) use of insulin: Secondary | ICD-10-CM

## 2017-10-15 DIAGNOSIS — E1165 Type 2 diabetes mellitus with hyperglycemia: Secondary | ICD-10-CM

## 2017-10-15 LAB — POCT GLYCOSYLATED HEMOGLOBIN (HGB A1C): Hemoglobin A1C: 9 % — AB (ref 4.0–5.6)

## 2017-10-15 NOTE — Progress Notes (Signed)
Patient ID: Vicki Rivera, female   DOB: [birth year], 42 y.o.   MRN: [id number]  HPI: Vicki Rivera is a 42 y.o.-year-old female, initially referred by her PCP, Dr.Smith, returning for follow-up for  DM2, dx in 2015, insulin-dependent, uncontrolled, without long term complications. Last appt 3 months ago.  We started U500 insulin at last visit and her sugar started to improve.  Every week, her average blood sugars are lower than the week before.  She brings a downloaded glucometer report.  She checks sugars mostly in the morning but occasionally later in the day, also.  Last hemoglobin A1c was: Lab Results  Component Value Date   HGBA1C 9.0 07/15/2017   HGBA1C 9.3 04/16/2017   HGBA1C 7.6 02/07/2016  12/04/2016: HbA1c 10.1% 09/12/2015: HbA1c 10.6% 12/21/2014: 7.1%  She was on: - Basaglar >> U200 Tresiba 30-34 units in a.m. and 40 units at night - Humalog 26-34 units before each meal, 3x a day   She tried Victoza >> N/V/AP and pancreatitis reportedly She tried Trulicity 2.86 mg weekly >> N/V She tried Invokana >> AP and severe nausea. 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, agricultural >> developed a facial rash (but unclear if from the Bai drinks she was drinking then)  At last visit, I suggested U500 insulin: -40-50 units before B -40-50 units before L  -60-70 units before D  Pt checks her sugars twice a day: - am: 154-254 >> 176-227, 245, 265 >> 133-215, but improved in last 2 weeks - 2h after b'fast: n/c - before lunch: 96-120, 174 >> 252 >> n/c >>163-213, 265 - 2h after lunch:185, 291 >> 190 >> n/c - before dinner:  147, 212-312 >> n/c >> 146-161, 224 - 2h after dinner: 309, 367 >> n/c >> 263 >> n/c - bedtime: 278-327, 358 >> n/c >> 196 - nighttime: n/c Lowest sugar was 154 >> 176 >> 133; she has hypoglycemia awareness in the 130s. Highest sugar was 332 >> 265 >> 265.  Glucometer: Freestyle >> [employer] >> AccuChek guide  Pt's meals are mostly plant-based  +  cheese. She was following an antiinflammatory diet - LEAP.  -  noCKD, last BUN/creatinine:  Lab Results  Component Value Date   BUN 12 04/16/2017   CREATININE 0.62 04/16/2017   09/12/2015, from PCP: - CMP normal, except glucose 205. BUN/creatinine 6/0.64, EGFR 102. - ACR less than 30 01/18/2014: BUN/Cr 9/0.66, eGFR 99 On  lisinopril.  - + HL; last set of lipids: Lab Results  Component Value Date   CHOL 110 04/16/2017   HDL 32.40 (L) 04/16/2017   LDLDIRECT 58.0 04/16/2017   TRIG 232.0 (H) 04/16/2017   CHOLHDL 3 04/16/2017  09/12/2015: 129/160/43/54 On Crestor.  - last eye exam was in 09/2016: No DR  (Dr. Valetta Close)   - No numbness and tingling in her feet.  She also has HTN, PCOS, anemia - started iron.  ROS: Constitutional: no weight gain/no weight loss,  + fatigue, no subjective hyperthermia, no subjective hypothermia Eyes: no blurry vision, no xerophthalmia ENT: no sore throat, no nodules palpated in throat, no dysphagia, no odynophagia, no hoarseness Cardiovascular: no CP/no SOB/no palpitations/+ leg swelling Respiratory: no cough/no SOB/no wheezing Gastrointestinal: no N/no V/no D/no C/no acid reflux Musculoskeletal: + Muscle aches/+ joint aches Skin: no rashes, no hair loss, + excessive hair loss Neurological: no tremors/no numbness/no tingling/no dizziness  I reviewed pt's medications, allergies, PMH, social hx, family hx, and changes were documented in the history of present illness. Otherwise, unchanged from  my initial visit note.    Past Medical History:  Diagnosis Date  . Allergy   . Anxiety   . Asthma   . Diabetes mellitus without complication ([place])    DM Type II   . Hypertension   . Insomnia   . Obesity   . PCOS (polycystic ovarian syndrome)   . Sinusitis    Past Surgical History:  Procedure Laterality Date  . CHOLECYSTECTOMY     Social History   Social History  . Marital Status: Single    Spouse Name: N/A  . Number of Children: 0   Social  History Main Topics  . Smoking status: Never Smoker   . Smokeless tobacco: Not on file  . Alcohol Use: 0.0 oz/week    0 Standard drinks or equivalent per week  . Drug Use: Not on file   Social History Narrative   Single (female partner-8 yrs)   0 children   Exercise- 2 times weekly   Caffeine use - 3-4 servings daily   [employer]    Occup - [employer], [employer]   Current Outpatient Medications on File Prior to Visit  Medication Sig Dispense Refill  . albuterol (VENTOLIN HFA) 108 (90 Base) MCG/ACT inhaler Inhale 2 puffs into the lungs every 6 (six) hours as needed.     . BD PEN NEEDLE NANO U/F 32G X 4 MM MISC USE TO INJECT INSULIN 4 TIMES DAILY. (Patient taking differently: USE TO INJECT INSULIN 5 TIMES DAILY.) 300 each 3  . Blood Glucose Monitoring Suppl (ONE TOUCH ULTRA SYSTEM KIT) w/Device KIT Use to test blood sugar daily. 1 each 0  . budesonide-formoterol (SYMBICORT) 160-4.5 MCG/ACT inhaler Inhale 2 puffs into the lungs 2 (two) times daily.    Marland Kitchen desvenlafaxine (PRISTIQ) 100 MG 24 hr tablet Take 100 mg by mouth daily.     . fluticasone (FLONASE) 50 MCG/ACT nasal spray Place 2 sprays into both nostrils daily.     Marland Kitchen glucose blood (ACCU-CHEK GUIDE) test strip Use to check blood sugars 3 times daily 100 each 12  . HUMULIN R U-500 KWIKPEN 500 UNIT/ML kwikpen Inject 40-50 units before B and L and 60-70 units before D 12 pen 3  . insulin lispro (HUMALOG KWIKPEN) 100 UNIT/ML KiwkPen Inject 26-34 units 3x a day under skin 45 pen 3  . levocetirizine (XYZAL) 5 MG tablet Take 5 mg by mouth every evening.     Marland Kitchen lisinopril-hydrochlorothiazide (PRINZIDE,ZESTORETIC) 10-12.5 MG tablet Take 1 tablet by mouth daily.     . montelukast (SINGULAIR) 5 MG chewable tablet Chew 10 mg by mouth at bedtime.     Glory Rosebush DELICA LANCETS 86P MISC Use to test 3 times daily as instructed. 300 each 3  . rosuvastatin (CRESTOR) 20 MG  tablet Take 20 mg by mouth daily.     . TRESIBA FLEXTOUCH 200 UNIT/ML SOPN INJECT 30 UNITS INTO THE SKIN 2 TIMES A DAU 27 pen 0   No current facility-administered medications on file prior to visit.    Allergies  Allergen Reactions  . Amoxicillin-Pot Clavulanate Hives  . Benzonatate Other (See Comments)  . Canagliflozin Nausea Only  . Ceftin  [Cefuroxime] Hives  . Ciprofloxacin Hives  . Clarithromycin Other (See Comments)  . Emetrol Nausea Only  . Metformin Nausea Only  . Sulfa Antibiotics Hives   Family History  Problem Relation Age of Onset  . Hypertension Mother   . Diabetes Mother   .  Hyperlipidemia Mother   . Parkinson's disease Mother   . Hyperlipidemia Father   . Diabetes Sister        TYPE I   . Diabetes Maternal Grandmother   . Dementia Maternal Grandfather   . Heart disease Paternal Grandfather        CAD; MI    PE: There were no vitals taken for this visit. Wt Readings from Last 3 Encounters:  07/15/17 (!) 368 lb 9.6 oz (167.2 kg)  04/16/17 (!) 363 lb 9.6 oz (164.9 kg)  01/11/17 (!) 356 lb 8 oz (161.7 kg)   Constitutional: overweight, in NAD Eyes: PERRLA, EOMI, no exophthalmos ENT: moist mucous membranes, no thyromegaly, no cervical lymphadenopathy Cardiovascular: RRR, No MRG Respiratory: CTA B Gastrointestinal: abdomen soft, NT, ND, BS+ Musculoskeletal: no deformities, strength intact in all 4 Skin: moist, warm, no rashes Neurological: no tremor with outstretched hands, DTR normal in all 4  ASSESSMENT: 1. DM2, insulin-dependent, uncontrolled, without long term complications, but with hyperglycemia  2. HL  PLAN:  1. Patient with uncontrolled diabetes, very insulin resistant, now on U500 insulin. She refused to try a GLP-1 receptor agonist, an SGLT2 inhibitor, or metformin, due to previous intolerances.  At last visit, sugars were still high, mostly in the 200s.  However, she was only checking in the morning and I advised her to try to check before  meals and occasionally at bedtime.  I also advised her to switch to U500 insulin and given coupon card.  I advised her that if she could not obtain this to increase [place] and Humalog and switch to U200 Humalog  for better site absorption.  She was able to obtain U500 insulin pens and started this. - At this visit, sugars are better, and continuing to improve.  Sugars are still above target, but in the last 2 weeks, she did not have sugars above 200s.  We discussed about using the U500 insulin doses in the upper and of the intervals that I gave her and may need to increase this by 5 to 10 units if needed, but I do not feel any other changes are necessary for now. - I suggested to:  Patient Instructions  Please continue: -40-50 units before B -40-50 units before L -60-70 units before D Use predominantly the higher doses in the above intervals.  - today, HbA1c is 9% (still high) - the next HbA1c should be better.  - continue checking sugars at different times of the day - check 3x a day, rotating checks - advised for yearly eye exams >> she is UTD - Return to clinic in 3-4 mo with sugar log   2. HL - Reviewed latest lipid panel from 04/2017, LDL at goal, triglycerides high, HDL low Lab Results  Component Value Date   CHOL 110 04/16/2017   HDL 32.40 (L) 04/16/2017   LDLDIRECT 58.0 04/16/2017   TRIG 232.0 (H) 04/16/2017   CHOLHDL 3 04/16/2017  - Continues Crestor without side effects.   Philemon Kingdom, MD PhD [facility]

## 2017-10-15 NOTE — Patient Instructions (Addendum)
Please continue: -40-50 units before B -40-50 units before L -60-70 units before D Use predominantly the higher doses in the above intervals.

## 2017-10-15 NOTE — Addendum Note (Signed)
Addended by: Yolande JollyLAWSON, Allona Gondek on: 10/15/2017 04:01 PM   Modules accepted: Orders

## 2017-12-27 ENCOUNTER — Other Ambulatory Visit: Payer: Self-pay | Admitting: Internal Medicine

## 2018-02-25 ENCOUNTER — Ambulatory Visit: Payer: Managed Care, Other (non HMO) | Admitting: Internal Medicine

## 2018-06-10 ENCOUNTER — Ambulatory Visit (INDEPENDENT_AMBULATORY_CARE_PROVIDER_SITE_OTHER): Payer: Managed Care, Other (non HMO) | Admitting: Internal Medicine

## 2018-06-10 ENCOUNTER — Encounter: Payer: Self-pay | Admitting: Internal Medicine

## 2018-06-10 ENCOUNTER — Other Ambulatory Visit: Payer: Self-pay

## 2018-06-10 VITALS — BP 150/80 | HR 111 | Ht 65.25 in | Wt 385.0 lb

## 2018-06-10 DIAGNOSIS — E1165 Type 2 diabetes mellitus with hyperglycemia: Secondary | ICD-10-CM | POA: Diagnosis not present

## 2018-06-10 DIAGNOSIS — E785 Hyperlipidemia, unspecified: Secondary | ICD-10-CM

## 2018-06-10 DIAGNOSIS — Z794 Long term (current) use of insulin: Secondary | ICD-10-CM

## 2018-06-10 DIAGNOSIS — Z6841 Body Mass Index (BMI) 40.0 and over, adult: Secondary | ICD-10-CM

## 2018-06-10 LAB — POCT GLYCOSYLATED HEMOGLOBIN (HGB A1C): HEMOGLOBIN A1C: 9.8 % — AB (ref 4.0–5.6)

## 2018-06-10 MED ORDER — HUMULIN R U-500 KWIKPEN 500 UNIT/ML ~~LOC~~ SOPN: PEN_INJECTOR | SUBCUTANEOUS | 3 refills | Status: DC

## 2018-06-10 MED ORDER — FREESTYLE LIBRE 14 DAY READER DEVI: 1.0000 | Freq: Once | 1 refills | Status: AC

## 2018-06-10 MED ORDER — FREESTYLE LIBRE 14 DAY SENSOR MISC: 1.0000 | 11 refills | Status: DC

## 2018-06-10 NOTE — Patient Instructions (Addendum)
Please continue U500 insulin: - 50-60 units before B - 40-50 units before L - 80-85 units before D    Please return for follow-up in 3 to 4 months.

## 2018-06-10 NOTE — Addendum Note (Signed)
Addended by: Darliss Ridgel I on: 06/10/2018 03:45 PM   Modules accepted: Orders

## 2018-06-10 NOTE — Progress Notes (Signed)
Patient ID: Vicki Rivera, female   DOB: 11/07/1975, 43 y.o.   MRN: [id number]  HPI: Vicki Rivera is a 43 y.o.-year-old female, initially referred by her PCP, Dr.Smith, returning for follow-up for  DM2, dx in 2015, insulin-dependent, uncontrolled, without long term complications. Last appt 8 months ago.  Last hemoglobin A1c was: Lab Results  Component Value Date   HGBA1C 9.0 (A) 10/15/2017   HGBA1C 9.0 07/15/2017   HGBA1C 9.3 04/16/2017  12/04/2016: HbA1c 10.1% 09/12/2015: HbA1c 10.6% 12/21/2014: 7.1%  She was on: - Basaglar >> U200 Tresiba 30-34 units in a.m. and 40 units at night - Humalog 26-34 units before each meal, 3x a day   She tried Victoza >> N/V/AP and pancreatitis reportedly She tried Trulicity 6.38 mg weekly >> N/V She tried Invokana >> AP and severe nausea. 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  Pt checks her sugars twice a day: - am: 176-227, 245, 265 >> 133-215 >> 166, 182, 194-231 - 2h after b'fast: n/c >> 257 - before lunch: 252 >> n/c >>163-213, 265 >> n/c - 2h after lunch:185, 291 >> 190 >> n/c >> 183 - before dinner:  147, 212-312 >> n/c >> 146-161, 224 >> 148, 233 - 2h after dinner: 309, 367 >> n/c >> 263 >> n/c - bedtime: 278-327, 358 >> n/c >> 196 >> n/c - nighttime: n/c Lowest sugar was 133 >> 148; she has hypoglycemia awareness in the 130s. Highest sugar was 265 >> 257.  Glucometer: Freestyle >> [employer] >> AccuChek guide  Patient is Mostly plant-based + cheese. She is following an anti-inflammatory diet-LEAP.  -No CKD, last BUN/creatinine: 04/24/2018: Glu 181, BUN/Cr 14/0.69, GFR 92  Lab Results  Component Value Date   BUN 12 04/16/2017   CREATININE 0.62 04/16/2017   09/12/2015,  from PCP: - CMP normal, except glucose 205. BUN/creatinine 6/0.64, EGFR 102. - ACR less than 30 01/18/2014: BUN/Cr 9/0.66, eGFR 99 On lisinopril.  -+ HL; last set of lipids: 04/24/2018: 132/211/35/55 Lab Results  Component Value Date   CHOL 110 04/16/2017   HDL 32.40 (L) 04/16/2017   LDLDIRECT 58.0 04/16/2017   TRIG 232.0 (H) 04/16/2017   CHOLHDL 3 04/16/2017  09/12/2015: 129/160/43/54 On Crestor.  - last eye exam was in 09/2017: No DR (Dr. Valetta Close)   - no numbness and tingling in her feet.  She also has HTN, PCOS, anemia - started iron.  ROS: Constitutional: + weight gain/no weight loss, + fatigue, no subjective hyperthermia, no subjective hypothermia Eyes: no blurry vision, no xerophthalmia ENT: no sore throat, no nodules palpated in neck, no dysphagia, no odynophagia, no hoarseness Cardiovascular: no CP/no SOB/no palpitations/+ leg swelling Respiratory: + cough/no SOB/no wheezing Gastrointestinal: no N/no V/no D/no C/no acid reflux Musculoskeletal: no muscle aches/no joint aches Skin: + rash - upper arms - folliculitis (?) - resolved, no hair loss Neurological: no tremors/no numbness/no tingling/no dizziness  I reviewed pt's medications, allergies, PMH, social hx, family hx, and changes were documented in the history of present illness. Otherwise, unchanged from my initial visit note.   Past Medical History:  Diagnosis Date  . Allergy   . Anxiety   . Asthma   . Diabetes mellitus without complication ([place])  DM Type II   . Hypertension   . Insomnia   . Obesity   . PCOS (polycystic ovarian syndrome)   . Sinusitis    Past Surgical History:  Procedure Laterality Date  . CHOLECYSTECTOMY     Social History   Social History  . Marital Status: Single    Spouse Name: N/A  . Number of Children: 0   Social History Main Topics  . Smoking status: Never Smoker   . Smokeless tobacco: Not on file  . Alcohol Use: 0.0 oz/week    0 Standard drinks or equivalent per  week  . Drug Use: Not on file   Social History Narrative   Single (female partner-8 yrs)   0 children   Exercise- 2 times weekly   Caffeine use - 3-4 servings daily   [place] [employer]    Occup - [employer], [employer]   Current Outpatient Medications on File Prior to Visit  Medication Sig Dispense Refill  . albuterol (VENTOLIN HFA) 108 (90 Base) MCG/ACT inhaler Inhale 2 puffs into the lungs every 6 (six) hours as needed.     . Blood Glucose Monitoring Suppl (ONE TOUCH ULTRA SYSTEM KIT) w/Device KIT Use to test blood sugar daily. 1 each 0  . budesonide-formoterol (SYMBICORT) 160-4.5 MCG/ACT inhaler Inhale 2 puffs into the lungs 2 (two) times daily.    Marland Kitchen desvenlafaxine (PRISTIQ) 100 MG 24 hr tablet Take 100 mg by mouth daily.     . fluticasone (FLONASE) 50 MCG/ACT nasal spray Place 2 sprays into both nostrils daily.     Marland Kitchen glucose blood (ACCU-CHEK GUIDE) test strip Use to check blood sugars 3 times daily 100 each 12  . HUMULIN R U-500 KWIKPEN 500 UNIT/ML kwikpen Inject 40-50 units before B and L and 60-70 units before D 12 pen 3  . Insulin Pen Needle (BD PEN NEEDLE NANO U/F) 32G X 4 MM MISC USE TO INJECT INSULIN 5 TIMES DAILY. 1000 each prn  . levocetirizine (XYZAL) 5 MG tablet Take 5 mg by mouth every evening.     Marland Kitchen lisinopril-hydrochlorothiazide (PRINZIDE,ZESTORETIC) 10-12.5 MG tablet Take 1 tablet by mouth daily.     . montelukast (SINGULAIR) 5 MG chewable tablet Chew 10 mg by mouth at bedtime.     Glory Rosebush DELICA LANCETS 57Q MISC Use to test 3 times daily as instructed. 300 each 3  . rosuvastatin (CRESTOR) 20 MG tablet Take 20 mg by mouth daily.      No current facility-administered medications on file prior to visit.    Allergies  Allergen Reactions  . Amoxicillin-Pot Clavulanate Hives  . Benzonatate Other (See Comments)  . Canagliflozin Nausea Only  . Ceftin  [Cefuroxime] Hives  . Ciprofloxacin Hives   . Clarithromycin Other (See Comments)  . Emetrol Nausea Only  . Metformin Nausea Only  . Sulfa Antibiotics Hives   Family History  Problem Relation Age of Onset  . Hypertension Mother   . Diabetes Mother   . Hyperlipidemia Mother   . Parkinson's disease Mother   . Hyperlipidemia Father   . Diabetes Sister        TYPE I   . Diabetes Maternal Grandmother   . Dementia Maternal Grandfather   . Heart disease Paternal Grandfather        CAD; MI    PE: BP (!) 150/80   Pulse (!) 111   Ht 5' 5.25" (1.657 m)   Wt (!) 385 lb (174.6 kg)  SpO2 98%   BMI 63.58 kg/m  Wt Readings from Last 3 Encounters:  06/10/18 (!) 385 lb (174.6 kg)  10/15/17 (!) 377 lb 6.4 oz (171.2 kg)  07/15/17 (!) 368 lb 9.6 oz (167.2 kg)   Constitutional: overweight, in NAD Eyes: PERRLA, EOMI, no exophthalmos ENT: moist mucous membranes, no thyromegaly, no cervical lymphadenopathy Cardiovascular: + tachycardia, RR, No MRG Respiratory: CTA B Gastrointestinal: abdomen soft, NT, ND, BS+ Musculoskeletal: no deformities, strength intact in all 4 Skin: moist, warm, no rashes Neurological: no tremor with outstretched hands, DTR normal in all 4  ASSESSMENT: 1. DM2, insulin-dependent, uncontrolled, without long term complications, but with hyperglycemia  2. HL  3. Obesity  PLAN:  1. Patient with uncontrolled type 2 diabetes, very insulin resistant, on U500 insulin.  In the past, she refused to try a GLP-1 receptor agonist, SGLT2 inhibitor, or metformin, due to previous intolerances.  Sugar started to improve after we switched to the concentrated insulin.  At last visit, sugars are better and continuing to improve.  They were still above target, though, so we discussed about using the doses in the upper range of the intervals that I gave her -At this visit, sugars are higher, most of them in the 200s.  She is not sure why the sugars increase, however, she did have a rash on her upper arms for which she had to be  on antibiotics and she noticed that sugars are slightly better after she finished antibiotics.  She also had an URI last week. -We discussed about the need to lose weight to improve her insulin resistance, but for now, will need to increase her insulin doses.  Since sugars are high in the morning we will increase the insulin with dinner.  We will also increase the insulin with breakfast, but I would not increase it before lunch since she sometimes feels that she is dropping after lunch. -She is interested in the freestyle libre CGM and I sent a prescription to her pharmacy.  I hope this will help [place] with a more efficient pattern detection, since now, she is not checking sugars midday or at night so it is difficult to adjust her insulin regimen. - I suggested to:  Patient Instructions  Please continue U500 insulin: - 50-60 units before B - 40-50 units before L - 80-85 units before D    Please return for follow-up in 3 to 4 months.  - today, HbA1c is 9.8% (higher) - continue checking sugars at different times of the day - check 3x a day, rotating checks - advised for yearly eye exams >> she is UTD - Return to clinic in 3-4 mo with sugar log   2. HL - Reviewed latest lipid panel from 04/2018: LDL at goal, triglycerides high but improved, HDL normal - Continues Crestor without side effects.  3.Obesity - + 8 lbs since last visit, 17 lbs in last year - Unfortunately, we need to use U500 insulin for her due to her high insulin resistance.  This is conducive to weight gain. - not open to suggestions about changes in diet - She plans to stay more active now that the weather is improving  Philemon Kingdom, MD PhD [facility]

## 2018-06-18 ENCOUNTER — Other Ambulatory Visit: Payer: Self-pay | Admitting: Internal Medicine

## 2018-07-01 ENCOUNTER — Other Ambulatory Visit: Payer: Self-pay | Admitting: Internal Medicine

## 2018-10-10 ENCOUNTER — Encounter: Payer: Self-pay | Admitting: Internal Medicine

## 2018-10-10 ENCOUNTER — Ambulatory Visit (INDEPENDENT_AMBULATORY_CARE_PROVIDER_SITE_OTHER): Payer: Managed Care, Other (non HMO) | Admitting: Internal Medicine

## 2018-10-10 ENCOUNTER — Other Ambulatory Visit: Payer: Self-pay

## 2018-10-10 VITALS — BP 130/82 | HR 112 | Ht 64.5 in | Wt 396.0 lb

## 2018-10-10 DIAGNOSIS — E1165 Type 2 diabetes mellitus with hyperglycemia: Secondary | ICD-10-CM

## 2018-10-10 DIAGNOSIS — Z794 Long term (current) use of insulin: Secondary | ICD-10-CM | POA: Diagnosis not present

## 2018-10-10 DIAGNOSIS — E66813 Obesity, class 3: Secondary | ICD-10-CM

## 2018-10-10 DIAGNOSIS — E785 Hyperlipidemia, unspecified: Secondary | ICD-10-CM | POA: Diagnosis not present

## 2018-10-10 DIAGNOSIS — Z6841 Body Mass Index (BMI) 40.0 and over, adult: Secondary | ICD-10-CM

## 2018-10-10 LAB — POCT GLYCOSYLATED HEMOGLOBIN (HGB A1C): Hemoglobin A1C: 10.3 % — AB (ref 4.0–5.6)

## 2018-10-10 MED ORDER — HUMULIN R U-500 KWIKPEN 500 UNIT/ML ~~LOC~~ SOPN: PEN_INJECTOR | SUBCUTANEOUS | 3 refills | Status: DC

## 2018-10-10 NOTE — Patient Instructions (Addendum)
Please change U500 insulin: - 50-60 >> 70-80 units before B - 40-50 units before L - 70 >> 60 units before D    Please return for follow-up in 3 to 4 months.

## 2018-10-10 NOTE — Progress Notes (Signed)
Patient ID: Vicki Rivera, female   DOB: 10/05/1975, 43 y.o.   MRN: [id number]  HPI: Vicki Rivera is a 43 y.o.-year-old female-year-old female, initially referred by her PCP, Dr.Smith, returning for follow-up for  DM2, dx in 2015, insulin-dependent, uncontrolled, without long term complications. Last appt 4 months ago.  Last hemoglobin A1c was: Lab Results  Component Value Date   HGBA1C 9.8 (A) 06/10/2018   HGBA1C 9.0 (A) 10/15/2017   HGBA1C 9.0 07/15/2017  12/04/2016: HbA1c 10.1% 09/12/2015: HbA1c 10.6% 12/21/2014: 7.1%  She was on: - Basaglar >> U200 Tresiba 30-34 units in a.m. and 40 units at night - Humalog 26-34 units before each meal, 3x a day   She tried Victoza >> N/V/AP and pancreatitis reportedly She tried Trulicity 3.55 mg weekly >> N/V She tried Invokana >> AP and severe nausea. 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  Pt checks her sugars >4x a day with her freestyle libre CGM obtained after last visit:  Pt.checks her sugars more than 4 times a day with his CGM.  Freestyle libre CGM parameters: - Average: 227 - % active CGM time: 67% of the time - Glucose variability 25.2 (target < or = to 36%) - time in range:  - very low (<54): 0% - low (54-69): 0% - normal range (70-180): 23% - high sugars (181-250): 42% - very high sugars (>250): 35%   Lowest sugar was 133 >> 148 >> 100 at 3 am; she has hypoglycemia awareness in the 130s. Highest sugar was 265 >> 257 >> 370.  Glucometer: Freestyle >> [employer] >> AccuChek guide  Patient is Mostly plant-based + cheese. She is following an anti-inflammatory diet-LEAP.  -No CKD, last BUN/creatinine: 04/24/2018: Glu 181, BUN/Cr 14/0.69, GFR 92  Lab Results  Component  Value Date   BUN 12 04/16/2017   CREATININE 0.62 04/16/2017   09/12/2015, from PCP: - CMP normal, except glucose 205. BUN/creatinine 6/0.64, EGFR 102. - ACR less than 30 01/18/2014: BUN/Cr 9/0.66, eGFR 99 On lisinopril.  -+ HL; last set of lipids: 04/24/2018: 132/211/35/55 Lab Results  Component Value Date   CHOL 110 04/16/2017   HDL 32.40 (L) 04/16/2017   LDLDIRECT 58.0 04/16/2017   TRIG 232.0 (H) 04/16/2017   CHOLHDL 3 04/16/2017  09/12/2015: 129/160/43/54 On Crestor.  - last eye exam was in 09/2017: No DR (Dr. Valetta Close)   - no numbness and tingling in her feet.  She also has HTN, PCOS, anemia - started iron.  ROS: Constitutional: + weight gain/no weight loss, no fatigue, no subjective hyperthermia, no subjective hypothermia Eyes: no blurry vision, no xerophthalmia ENT: no sore throat, no nodules palpated in neck, no dysphagia, no odynophagia, no hoarseness Cardiovascular: no CP/no SOB/no palpitations/no leg swelling Respiratory: no cough/no SOB/no wheezing Gastrointestinal: no N/no V/no D/no C/no acid reflux Musculoskeletal: no muscle aches/no joint aches Skin: no rashes, no hair loss Neurological: no tremors/no numbness/no tingling/no dizziness, + nerve pain - neck  I reviewed pt's medications, allergies, PMH, social hx, family hx, and changes were documented in the history of present illness. Otherwise, unchanged from my initial visit note.   Past Medical History:  Diagnosis Date  . Allergy   . Anxiety   . Asthma   .  Diabetes mellitus without complication ([place])    DM Type II   . Hypertension   . Insomnia   . Obesity   . PCOS (polycystic ovarian syndrome)   . Sinusitis    Past Surgical History:  Procedure Laterality Date  . CHOLECYSTECTOMY     Social History   Social History  . Marital Status: Single    Spouse Name: N/A  . Number of Children: 0   Social History Main Topics  . Smoking status: Never Smoker   . Smokeless tobacco: Not on file  . Alcohol  Use: 0.0 oz/week    0 Standard drinks or equivalent per week  . Drug Use: Not on file   Social History Narrative   Single (female partner-8 yrs)   0 children   Exercise- 2 times weekly   Caffeine use - 3-4 servings daily   [place] [employer]    Occup - [employer], [employer]   Current Outpatient Medications on File Prior to Visit  Medication Sig Dispense Refill  . albuterol (VENTOLIN HFA) 108 (90 Base) MCG/ACT inhaler Inhale 2 puffs into the lungs every 6 (six) hours as needed.     . Blood Glucose Monitoring Suppl (ONE TOUCH ULTRA SYSTEM KIT) w/Device KIT Use to test blood sugar daily. 1 each 0  . budesonide-formoterol (SYMBICORT) 160-4.5 MCG/ACT inhaler Inhale 2 puffs into the lungs 2 (two) times daily.    . Continuous Blood Gluc Sensor (FREESTYLE LIBRE 14 DAY SENSOR) MISC 1 each by Does not apply route every 14 (fourteen) days. Change every 2 weeks 2 each 11  . desvenlafaxine (PRISTIQ) 100 MG 24 hr tablet Take 100 mg by mouth daily.     . fluticasone (FLONASE) 50 MCG/ACT nasal spray Place 2 sprays into both nostrils daily.     Marland Kitchen glucose blood (ACCU-CHEK GUIDE) test strip Use to check blood sugars 3 times daily 100 each 12  . HUMULIN R U-500 KWIKPEN 500 UNIT/ML kwikpen INJECT 40-50 UNITS BEFORE BREAKFAST AND LUNCH AND 60-70 UNITS BEFORE DINNER 36 pen 3  . Insulin Pen Needle (BD PEN NEEDLE NANO U/F) 32G X 4 MM MISC USE TO INJECT INSULIN 5 TIMES DAILY 500 each 1  . levocetirizine (XYZAL) 5 MG tablet Take 5 mg by mouth every evening.     Marland Kitchen lisinopril-hydrochlorothiazide (PRINZIDE,ZESTORETIC) 10-12.5 MG tablet Take 1 tablet by mouth daily.     . montelukast (SINGULAIR) 5 MG chewable tablet Chew 10 mg by mouth at bedtime.     Glory Rosebush DELICA LANCETS 20N MISC Use to test 3 times daily as instructed. 300 each 3  . rosuvastatin (CRESTOR) 20 MG tablet Take 20 mg by mouth daily.      No current facility-administered  medications on file prior to visit.    Allergies  Allergen Reactions  . Amoxicillin-Pot Clavulanate Hives  . Benzonatate Other (See Comments)  . Canagliflozin Nausea Only  . Ceftin  [Cefuroxime] Hives  . Ciprofloxacin Hives  . Clarithromycin Other (See Comments)  . Emetrol Nausea Only  . Metformin Nausea Only  . Sulfa Antibiotics Hives   Family History  Problem Relation Age of Onset  . Hypertension Mother   . Diabetes Mother   . Hyperlipidemia Mother   . Parkinson's disease Mother   . Hyperlipidemia Father   . Diabetes Sister        TYPE I   . Diabetes Maternal Grandmother   . Dementia Maternal Grandfather   . Heart disease  Paternal Grandfather        CAD; MI    PE: BP 130/82   Pulse (!) 112   Ht 5' 4.5" (1.638 m) Comment: measured without shoes  Wt (!) 396 lb (179.6 kg)   BMI 66.92 kg/m  Wt Readings from Last 3 Encounters:  10/10/18 (!) 396 lb (179.6 kg)  06/10/18 (!) 385 lb (174.6 kg)  10/15/17 (!) 377 lb 6.4 oz (171.2 kg)   Constitutional: Obese, in NAD Eyes: PERRLA, EOMI, no exophthalmos ENT: moist mucous membranes, no thyromegaly, no cervical lymphadenopathy Cardiovascular: + Tachycardia RR, No MRG Respiratory: CTA B Gastrointestinal: abdomen soft, NT, ND, BS+ Musculoskeletal: no deformities, strength intact in all 4 Skin: moist, warm, no rashes Neurological: no tremor with outstretched hands, DTR normal in all 4  ASSESSMENT: 1. DM2, insulin-dependent, uncontrolled, without long term complications, but with hyperglycemia  2. HL  3. Obesity  PLAN:  1. Patient with uncontrolled type 2 diabetes, very insulin resistant, on U500 insulin regimen, with still poor control.  In the past, she tried SGLT 2 inhibitors, GLP-1 receptor agonist, and metformin, and had intolerance.  She would not want to retry these.  Initially sugars improved after switching to U500 insulin but afterwards her HbA1c started to worsen and at this visit, especially in the last week,  sugars are higher than before.  She is now using a freestyle libre CGM and we reviewed together the downloads.  She has a pattern of lower blood sugars in the morning which increase in the first half of the day and remain elevated until after dinner, when they start to drop.  For now, we discussed about decreasing the U500 insulin dose in the morning and decreasing the dose at night to avoid precipitous drops while she sleeps. - I suggested to:  Patient Instructions  Please change U500 insulin: - 50-60 >> 70-80 units before B - 40-50 units before L - 70 >> 60 units before D    Please return for follow-up in 3 to 4 months.  - we checked her HbA1c: 10.3% (higher) - advised to check sugars at different times of the day - 3-4x a day, rotating check times - advised for yearly eye exams >> she is UTD - return to clinic in 3-4 months   2. HL - Reviewed latest lipid panel from 04/2018: LDL at goal, triglycerides high, but improved, HDL normal - Continues Crestor without side effects.  3.Obesity -Unfortunately her weight increased by 10 pounds in the last 4 months -We need to use U500 insulin for her due to high insulin resistance and this is conducive to weight gain -She is not open to suggestions about changes in her diet  Philemon Kingdom, MD PhD [facility]

## 2018-10-10 NOTE — Addendum Note (Signed)
Addended by: Cardell Peach I on: 10/10/2018 10:16 AM   Modules accepted: Orders

## 2018-12-15 ENCOUNTER — Other Ambulatory Visit: Payer: Self-pay | Admitting: Family Medicine

## 2018-12-15 DIAGNOSIS — Z1231 Encounter for screening mammogram for malignant neoplasm of breast: Secondary | ICD-10-CM

## 2019-01-29 ENCOUNTER — Other Ambulatory Visit: Payer: Self-pay

## 2019-01-29 ENCOUNTER — Ambulatory Visit
Admission: RE | Admit: 2019-01-29 | Discharge: 2019-01-29 | Disposition: A | Discharge status: Home / Self Care | Payer: Managed Care, Other (non HMO) | Source: Ambulatory Visit | Attending: Family Medicine | Admitting: Family Medicine

## 2019-01-29 DIAGNOSIS — Z1231 Encounter for screening mammogram for malignant neoplasm of breast: Secondary | ICD-10-CM

## 2019-02-10 ENCOUNTER — Ambulatory Visit (INDEPENDENT_AMBULATORY_CARE_PROVIDER_SITE_OTHER): Payer: Managed Care, Other (non HMO) | Admitting: Internal Medicine

## 2019-02-10 ENCOUNTER — Other Ambulatory Visit: Payer: Self-pay

## 2019-02-10 ENCOUNTER — Encounter: Payer: Self-pay | Admitting: Internal Medicine

## 2019-02-10 VITALS — BP 130/80 | HR 102 | Ht 64.5 in | Wt 394.0 lb

## 2019-02-10 DIAGNOSIS — E785 Hyperlipidemia, unspecified: Secondary | ICD-10-CM | POA: Diagnosis not present

## 2019-02-10 DIAGNOSIS — Z23 Encounter for immunization: Secondary | ICD-10-CM | POA: Diagnosis not present

## 2019-02-10 DIAGNOSIS — E1165 Type 2 diabetes mellitus with hyperglycemia: Secondary | ICD-10-CM | POA: Diagnosis not present

## 2019-02-10 DIAGNOSIS — Z794 Long term (current) use of insulin: Secondary | ICD-10-CM

## 2019-02-10 DIAGNOSIS — Z6841 Body Mass Index (BMI) 40.0 and over, adult: Secondary | ICD-10-CM

## 2019-02-10 LAB — POCT GLYCOSYLATED HEMOGLOBIN (HGB A1C): Hemoglobin A1C: 10.7 % — AB (ref 4.0–5.6)

## 2019-02-10 MED ORDER — ACCU-CHEK GUIDE W/DEVICE KIT: 1.0000 | PACK | Freq: Every day | 0 refills | Status: AC

## 2019-02-10 NOTE — Patient Instructions (Addendum)
Please increase U500 insulin: - 60 >> 70-80 units before B - 40-50 units before L - 60 units before D  If sugars remain higher than target, you may adjust the insulin doses up by 5-10 units every 5 days.    Please return for follow-up in 3 to 4 months.

## 2019-02-10 NOTE — Addendum Note (Signed)
Addended by: Cardell Peach I on: 02/10/2019 10:55 AM   Modules accepted: Orders

## 2019-02-10 NOTE — Progress Notes (Signed)
Patient ID: Vicki Rivera, female   DOB: 21-Mar-1976, 43 y.o.   MRN: [id number]  HPI: Vicki Rivera is a 43 y.o.-year-old female, initially referred by her PCP, Dr.Smith, returning for follow-up for  DM2, dx in 2015, insulin-dependent, uncontrolled, without long term complications. Last appt 4 months ago.  Reviewed HbA1c levels: Lab Results  Component Value Date   HGBA1C 10.3 (A) 10/10/2018   HGBA1C 9.8 (A) 06/10/2018   HGBA1C 9.0 (A) 10/15/2017  12/04/2016: HbA1c 10.1% 09/12/2015: HbA1c 10.6% 12/21/2014: 7.1%  She was on: - Basaglar >> U200 Tresiba 30-34 units in a.m. and 40 units at night - Humalog 26-34 units before each meal, 3x a day   She tried Victoza >> N/V/AP and pancreatitis reportedly She tried Trulicity 9.21 mg weekly >> N/V She tried Invokana >> AP and severe nausea. 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  At this visit,  She just attached a new sensor >> we did not have enough data for analysis.  Sugars are high throughout the day: 200-300s, without clear trends.  Freestyle libre CGM parameters: - Average: 227 >> 245 - % active CGM time: 67% >> 18% of the time  Previously:  Lowest sugar was 133 >> 148 >> 100 at 3 am >> 130; she has hypoglycemia awareness in the 130s. Highest sugar was 265 >> 257 >> 370 >> 306.  Glucometer: Freestyle >> [employer] >> AccuChek guide  Patient is Mostly plant-based + cheese. She is following an anti-inflammatory diet-LEAP.  -No CKD, last BUN/creatinine: 04/24/2018: Glu 181, BUN/Cr 14/0.69, GFR 92  Lab Results  Component Value Date   BUN 12 04/16/2017   CREATININE 0.62 04/16/2017   09/12/2015, from PCP: - CMP normal,  except glucose 205. BUN/creatinine 6/0.64, EGFR 102. - ACR less than 30 01/18/2014: BUN/Cr 9/0.66, eGFR 99 On lisinopril.  -+ HL; last set of lipids: 04/24/2018: 132/211/35/55 Lab Results  Component Value Date   CHOL 110 04/16/2017   HDL 32.40 (L) 04/16/2017   LDLDIRECT 58.0 04/16/2017   TRIG 232.0 (H) 04/16/2017   CHOLHDL 3 04/16/2017  09/12/2015: 129/160/43/54 On Crestor.  - last eye exam was in 09/2017: No DR (Dr. Valetta Close)   - no numbness and tingling in her feet.  She also has HTN, PCOS, anemia -on iron.  ROS: Constitutional: no weight gain/no weight loss, no fatigue, no subjective hyperthermia, no subjective hypothermia Eyes: no blurry vision, no xerophthalmia ENT: no sore throat, no nodules palpated in neck, no dysphagia, no odynophagia, no hoarseness Cardiovascular: no CP/no SOB/no palpitations/no leg swelling Respiratory: no cough/no SOB/no wheezing Gastrointestinal: no N/no V/no D/no C/no acid reflux Musculoskeletal: no muscle aches/no joint aches Skin: no rashes, no hair loss Neurological: no tremors/no numbness/no tingling/no dizziness  I reviewed pt's medications, allergies, PMH, social hx, family hx, and changes were documented in the history of present illness. Otherwise, unchanged from my initial visit note.  Past Medical History:  Diagnosis Date  . Allergy   . Anxiety   . Asthma   . Diabetes mellitus without complication ([place])    DM Type II   . Hypertension   . Insomnia   .  Obesity   . PCOS (polycystic ovarian syndrome)   . Sinusitis    Past Surgical History:  Procedure Laterality Date  . CHOLECYSTECTOMY     Social History   Social History  . Marital Status: Single    Spouse Name: N/A  . Number of Children: 0   Social History Main Topics  . Smoking status: Never Smoker   . Smokeless tobacco: Not on file  . Alcohol Use: 0.0 oz/week    0 Standard drinks or equivalent per week  . Drug Use: Not on file   Social History Narrative   Single  (female partner-8 yrs)   0 children   Exercise- 2 times weekly   Caffeine use - 3-4 servings daily   [place] [employer]    Occup - [employer], [employer]   Current Outpatient Medications on File Prior to Visit  Medication Sig Dispense Refill  . albuterol (VENTOLIN HFA) 108 (90 Base) MCG/ACT inhaler Inhale 2 puffs into the lungs every 6 (six) hours as needed.     . Blood Glucose Monitoring Suppl (ONE TOUCH ULTRA SYSTEM KIT) w/Device KIT Use to test blood sugar daily. 1 each 0  . budesonide-formoterol (SYMBICORT) 160-4.5 MCG/ACT inhaler Inhale 2 puffs into the lungs 2 (two) times daily.    . Continuous Blood Gluc Sensor (FREESTYLE LIBRE 14 DAY SENSOR) MISC 1 each by Does not apply route every 14 (fourteen) days. Change every 2 weeks 2 each 11  . desvenlafaxine (PRISTIQ) 100 MG 24 hr tablet Take 100 mg by mouth daily.     . fluticasone (FLONASE) 50 MCG/ACT nasal spray Place 2 sprays into both nostrils daily.     Marland Kitchen glucose blood (ACCU-CHEK GUIDE) test strip Use to check blood sugars 3 times daily 100 each 12  . HUMULIN R U-500 KWIKPEN 500 UNIT/ML kwikpen INJECT UP TO 200 units daily under skin as advised 36 pen 3  . Insulin Pen Needle (BD PEN NEEDLE NANO U/F) 32G X 4 MM MISC USE TO INJECT INSULIN 5 TIMES DAILY 500 each 1  . levocetirizine (XYZAL) 5 MG tablet Take 5 mg by mouth every evening.     Marland Kitchen lisinopril-hydrochlorothiazide (PRINZIDE,ZESTORETIC) 10-12.5 MG tablet Take 1 tablet by mouth daily.     . montelukast (SINGULAIR) 5 MG chewable tablet Chew 10 mg by mouth at bedtime.     Glory Rosebush DELICA LANCETS 26E MISC Use to test 3 times daily as instructed. 300 each 3  . rosuvastatin (CRESTOR) 20 MG tablet Take 20 mg by mouth daily.      No current facility-administered medications on file prior to visit.    Allergies  Allergen Reactions  . Amoxicillin-Pot Clavulanate Hives  . Benzonatate Other (See Comments)  .  Canagliflozin Nausea Only  . Ceftin  [Cefuroxime] Hives  . Ciprofloxacin Hives  . Clarithromycin Other (See Comments)  . Emetrol Nausea Only  . Metformin Nausea Only  . Sulfa Antibiotics Hives   Family History  Problem Relation Age of Onset  . Hypertension Mother   . Diabetes Mother   . Hyperlipidemia Mother   . Parkinson's disease Mother   . Hyperlipidemia Father   . Diabetes Sister        TYPE I   . Diabetes Maternal Grandmother   . Dementia Maternal Grandfather   . Heart disease Paternal Grandfather        CAD; MI    PE: BP 130/80   Pulse (!) 102  Ht 5' 4.5" (1.638 m)   Wt (!) 394 lb (178.7 kg)   SpO2 98%   BMI 66.59 kg/m  Wt Readings from Last 3 Encounters:  02/10/19 (!) 394 lb (178.7 kg)  10/10/18 (!) 396 lb (179.6 kg)  06/10/18 (!) 385 lb (174.6 kg)   Constitutional: overweight, in NAD Eyes: PERRLA, EOMI, no exophthalmos ENT: moist mucous membranes, no thyromegaly, no cervical lymphadenopathy Cardiovascular: Tachycardia,RR, No MRG Respiratory: CTA B Gastrointestinal: abdomen soft, NT, ND, BS+ Musculoskeletal: no deformities, strength intact in all 4 Skin: moist, warm, no rashes Neurological: no tremor with outstretched hands, DTR normal in all 4  ASSESSMENT: 1. DM2, insulin-dependent, uncontrolled, without long term complications, but with hyperglycemia  2. HL  3. Obesity  PLAN:  1. Patient with uncontrolled type 2 diabetes, very insulin resistant, on U500 insulin regimen, with still poor control.  In the past, we tried GLP-1 receptor agonist, Metformin, SGLT2 inhibitors but she was not able to tolerate them.  She would not want to retry these.  Initially sugars improved after switching to the more concentrated insulin, but afterwards.  HbA1c started to worsen. -At last visit, we increased her insulin in the morning but since she was dropping her sugars after dinner, I advised her to decrease the dose at night -At this visit, her sugars are high  throughout the day, without large fluctuations.  She did not increase the dose of insulin in the morning as suggested at last visit.  States sugars remain high throughout the day, strongly advised her to do so now and I encouraged her to continue to increase the doses as needed if the sugars do remain high.  We will keep the rest of the doses the same, since her sugars may improve after she increases her morning doses of insulin. -She also absolutely needs to work on her diet and lose weight to improve her insulin resistance. - I suggested to:  Patient Instructions  Please increase U500 insulin: - 60 >> 70-80 units before B - 40-50 units before L - 60 units before D  If sugars remain higher than target, you may adjust the insulin doses up by 5-10 units every 5 days.    Please return for follow-up in 3 to 4 months.  - we checked her HbA1c: 10.7% (increased) - advised to check sugars at different times of the day - 4x a day, rotating check times - advised for yearly eye exams >> she is not UTD - return to clinic in 3-4 months   2. HL - Reviewed latest lipid panel from 04/2018: LDL at goal, triglycerides high, but improved, HDL normal - Continues  Crestor without side effects.  3.Obesity -Before last visit, she had a weight gain of 10 pounds -We need to use U500 insulin for her due to high insulin resistance and this is conducive to weight gain -She is usually not open to suggestions about changes in her diet, but she agrees that she is eating too many carbs.  Philemon Kingdom, MD PhD [facility] Endocrinology

## 2019-05-17 ENCOUNTER — Other Ambulatory Visit: Payer: Self-pay | Admitting: Internal Medicine

## 2019-06-06 ENCOUNTER — Other Ambulatory Visit: Payer: Self-pay | Admitting: Internal Medicine

## 2019-06-10 ENCOUNTER — Other Ambulatory Visit: Payer: Self-pay

## 2019-06-10 ENCOUNTER — Encounter: Payer: Self-pay | Admitting: Internal Medicine

## 2019-06-10 ENCOUNTER — Ambulatory Visit (INDEPENDENT_AMBULATORY_CARE_PROVIDER_SITE_OTHER): Payer: Managed Care, Other (non HMO) | Admitting: Internal Medicine

## 2019-06-10 VITALS — BP 128/80 | HR 102 | Ht 64.5 in | Wt >= 6400 oz

## 2019-06-10 DIAGNOSIS — E1165 Type 2 diabetes mellitus with hyperglycemia: Secondary | ICD-10-CM | POA: Diagnosis not present

## 2019-06-10 DIAGNOSIS — Z794 Long term (current) use of insulin: Secondary | ICD-10-CM | POA: Diagnosis not present

## 2019-06-10 DIAGNOSIS — Z6841 Body Mass Index (BMI) 40.0 and over, adult: Secondary | ICD-10-CM

## 2019-06-10 DIAGNOSIS — E785 Hyperlipidemia, unspecified: Secondary | ICD-10-CM | POA: Diagnosis not present

## 2019-06-10 LAB — POCT GLYCOSYLATED HEMOGLOBIN (HGB A1C): Hemoglobin A1C: 10 % — AB (ref 4.0–5.6)

## 2019-06-10 NOTE — Progress Notes (Signed)
Patient ID: Vicki Rivera, female   DOB: 04-08-1975, 44 y.o.   MRN: [id number]  This visit occurred during the SARS-CoV-2 public health emergency.  Safety protocols were in place, including screening questions prior to the visit, additional usage of staff PPE, and extensive cleaning of exam room while observing appropriate contact time as indicated for disinfecting solutions.   HPI: Vicki Rivera is a 44 y.o.-year-old female, initially referred by her PCP, Dr.Smith, returning for follow-up for  DM2, dx in 2015, insulin-dependent, uncontrolled, without long term complications. Last appt 4 months ago.  Reviewed HbA1c levels: Lab Results  Component Value Date   HGBA1C 10.7 (A) 02/10/2019   HGBA1C 10.3 (A) 10/10/2018   HGBA1C 9.8 (A) 06/10/2018  12/04/2016: HbA1c 10.1% 09/12/2015: HbA1c 10.6% 12/21/2014: 7.1%  She was on: - Basaglar >> U200 Tresiba 30-34 units in a.m. and 40 units at night - Humalog 26-34 units before each meal, 3x a day   She tried Victoza >> N/V/AP and pancreatitis reportedly She tried Trulicity 1.79 mg weekly >> N/V She tried Invokana >> AP and severe nausea. 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  CGM parameters: - Average: 227 >> 245 >> 220 - % active CGM time: 67% >> 18% >> 66% of the time - Glucose management indicator: 8.6% - time in range:  - very low (<54): 0% - low (54-69): 0% - normal range (70-180): 23% - high sugars (181-250): 45% - very high sugars (>250): 32%     Previously:  Lowest sugar was 100 >> 130 >> 86; she has hypoglycemia awareness in the 150. Highest sugar was 370 >> 306 >>  347.  Glucometer: Freestyle >> [employer] >> AccuChek guide  Patient is Mostly plant-based + cheese. She is following an anti-inflammatory diet-LEAP.  -No CKD, last BUN/creatinine: 04/30/2019: Glu 226, BUN/Cr 10/0.69, GFR 92 04/24/2018: Glu 181, BUN/Cr 14/0.69, GFR 92  Lab Results  Component Value Date   BUN 12 04/16/2017   CREATININE 0.62 04/16/2017   09/12/2015, from PCP: - CMP normal, except glucose 205. BUN/creatinine 6/0.64, EGFR 102. - ACR less than 30 01/18/2014: BUN/Cr 9/0.66, eGFR 99 On lisinopril.  -+ HL; last set of lipids: 04/30/2019: 142/379/34/51 04/24/2018: 132/211/35/55 Lab Results  Component Value Date   CHOL 110 04/16/2017   HDL 32.40 (L) 04/16/2017   LDLDIRECT 58.0 04/16/2017   TRIG 232.0 (H) 04/16/2017   CHOLHDL 3 04/16/2017  09/12/2015: 129/160/43/54 On Crestor.  - last eye exam was in 04/2019: No DR (Dr. Valetta Close)   - no numbness and tingling in her feet.  She also has HTN, PCOS, anemia -on iron.  ROS: Constitutional: no weight gain/no weight loss, no fatigue, no subjective hyperthermia, no subjective hypothermia Eyes: no blurry vision, no xerophthalmia ENT: no sore throat, no nodules palpated in neck, no dysphagia, no odynophagia, no hoarseness Cardiovascular: no CP/no SOB/no palpitations/no leg swelling Respiratory: no cough/no SOB/no wheezing Gastrointestinal: no N/no V/no D/no C/no acid reflux Musculoskeletal: no muscle aches/no joint aches Skin: no rashes, no hair loss Neurological: no tremors/no numbness/no tingling/no dizziness  I reviewed pt's medications,  allergies, PMH, social hx, family hx, and changes were documented in the history of present illness. Otherwise, unchanged from my initial visit note.  Past Medical History:  Diagnosis Date  . Allergy   . Anxiety   . Asthma   . Diabetes mellitus without complication ([place])    DM Type II   . Hypertension   . Insomnia   . Obesity   . PCOS (polycystic ovarian syndrome)   .  Sinusitis    Past Surgical History:  Procedure Laterality Date  . CHOLECYSTECTOMY     Social History   Social History  . Marital Status: Single    Spouse Name: N/A  . Number of Children: 0   Social History Main Topics  . Smoking status: Never Smoker   . Smokeless tobacco: Not on file  . Alcohol Use: 0.0 oz/week    0 Standard drinks or equivalent per week  . Drug Use: Not on file   Social History Narrative   Single (female partner-8 yrs)   0 children   Exercise- 2 times weekly   Caffeine use - 3-4 servings daily   [employer] [employer]    Occup - [employer], [employer]   Current Outpatient Medications on File Prior to Visit  Medication Sig Dispense Refill  . albuterol (VENTOLIN HFA) 108 (90 Base) MCG/ACT inhaler Inhale 2 puffs into the lungs every 6 (six) hours as needed.     . Blood Glucose Monitoring Suppl (ACCU-CHEK GUIDE) w/Device KIT 1 each by Does not apply route daily. 1 kit 0  . budesonide-formoterol (SYMBICORT) 160-4.5 MCG/ACT inhaler Inhale 2 puffs into the lungs 2 (two) times daily.    . Continuous Blood Gluc Sensor (FREESTYLE LIBRE 14 DAY SENSOR) MISC APPLY AS DIRECTED AND CHANGE EVERY 14 DAYS 2 each 11  . desvenlafaxine (PRISTIQ) 100 MG 24 hr tablet Take 100 mg by mouth daily.     . fluticasone (FLONASE) 50 MCG/ACT nasal spray Place 2 sprays into both nostrils daily.     Marland Kitchen glucose blood (ACCU-CHEK GUIDE) test strip Use to check blood sugars 3 times daily 100 each 12  . HUMULIN R U-500 KWIKPEN 500 UNIT/ML kwikpen INJECT 40-85 UNITS BEFORE THE 3 MEALS AS ADVISED 18 pen 7  . Insulin Pen Needle (BD PEN NEEDLE NANO U/F) 32G X 4 MM MISC USE TO INJECT INSULIN 5 TIMES DAILY 500 each 1  . levocetirizine (XYZAL) 5 MG tablet Take 5 mg by mouth every evening.     Marland Kitchen lisinopril-hydrochlorothiazide (PRINZIDE,ZESTORETIC) 10-12.5 MG tablet Take 1 tablet by mouth daily.     . montelukast (SINGULAIR) 5 MG  chewable tablet Chew 10 mg by mouth at bedtime.     Glory Rosebush DELICA LANCETS 09W MISC Use to test 3 times daily as instructed. 300 each 3  . rosuvastatin (CRESTOR) 20 MG tablet Take 20 mg by mouth daily.      No current facility-administered medications on file prior to visit.   Allergies  Allergen Reactions  . Amoxicillin-Pot Clavulanate Hives  . Benzonatate Other (See Comments)  . Canagliflozin Nausea Only  . Ceftin  [Cefuroxime] Hives  . Ciprofloxacin Hives  . Clarithromycin Other (See Comments)  . Emetrol Nausea Only  . Metformin Nausea Only  . Sulfa Antibiotics Hives   Family History  Problem Relation Age of Onset  . Hypertension Mother   . Diabetes Mother   . Hyperlipidemia Mother   . Parkinson's disease Mother   . Hyperlipidemia  Father   . Diabetes Sister        TYPE I   . Diabetes Maternal Grandmother   . Dementia Maternal Grandfather   . Heart disease Paternal Grandfather        CAD; MI    PE: BP 128/80   Pulse (!) 102   Ht 5' 4.5" (1.638 m)   Wt (!) 401 lb (181.9 kg)   SpO2 97%   BMI 67.77 kg/m  Wt Readings from Last 3 Encounters:  06/10/19 (!) 401 lb (181.9 kg)  02/10/19 (!) 394 lb (178.7 kg)  10/10/18 (!) 396 lb (179.6 kg)   Constitutional: Obese, in NAD Eyes: PERRLA, EOMI, no exophthalmos ENT: moist mucous membranes, no thyromegaly, no cervical lymphadenopathy Cardiovascular: Tachycardia, RR, No MRG Respiratory: CTA B Gastrointestinal: abdomen soft, NT, ND, BS+ Musculoskeletal: no deformities, strength intact in all 4 Skin: moist, warm, no rashes Neurological: no tremor with outstretched hands, DTR normal in all 4  ASSESSMENT: 1. DM2, insulin-dependent, uncontrolled, without long term complications, but with hyperglycemia  2. HL  3. Obesity class III  PLAN:  1. Patient with uncontrolled type 2 diabetes, very insulin resistant, on U500 insulin regimen, with still poor control.  In the past, we tried GLP-1 receptor agonist, Metformin, SGLT2  inhibitors, but she was not able to tolerate them (see HPI).  She would not want to retry these.  Initially, her sugars improved after switching to a more concentrated insulin, but afterwards, they started to worsen again. -At last visit I advised her to increase her insulin dose in the morning as her sugars after breakfast were increasing significantly and they were still high throughout the day.  She did so, but sugars still increase after breakfast.  She did not push the dose up further in a.m., but she did it later in the day. -At this visit, sugars still increase in the morning and she feels that these are not insulin related to breakfast, which is later.  Upon questioning, she is drinking coffee with sugar in the morning I advised her that she most likely will need to bolus for coffee and will start with 20 units.  After she starts this, she can bolus only 60 units for breakfast but I advised her that if the sugars remain high, she needs to push the dose up back to 70-80 units -Later in the day, we can stay with the same doses but may be using the lower intervals especially if we are able to improve her sugars in the morning -She absolutely needs to work on her diet and lose weight to improve her insulin regimen.  Now that the weather is nicer, she is planning to go outside more. - I suggested to:  Patient Instructions  Please change U500 insulin: - 20 units for coffee - 60-80 units before B - 50-70 units before L - 60-70 units before D    Please return for follow-up in 3 to 4 months.  - we checked her HbA1c: 10% (slightly lower) however, per review of her CGM, her GMI is 8.6% (projected HbA1c if her diabetes control for the last 2 weeks is maintained) - advised to check sugars at different times of the day - 4x a day, rotating check times - advised for yearly eye exams >> she is UTD - return to clinic in 3-4 months   2. HL -Reviewed latest lipid panel from 04/2019: LDL at goal,  triglycerides higher than before, HDL low -Continues Crestor without side effects  3.Obesity class III -Unfortunately, we need to continue to use the concentrated insulin due to high insulin resistance and this is conducive to weight gain -She is usually not open to suggestions about changing her diet, but at last visit she was admitting to eating too many carbs. -At this visit, she gained 7 pounds from last visit  Philemon Kingdom, MD PhD [facility] Endocrinology

## 2019-06-10 NOTE — Patient Instructions (Addendum)
Please change U500 insulin: - 20 units for coffee - 60-80 units before B - 50-70 units before L - 60-70 units before D    Please return for follow-up in 3 to 4 months.

## 2019-06-10 NOTE — Addendum Note (Signed)
Addended by: Darliss Ridgel I on: 06/10/2019 09:21 AM   Modules accepted: Orders

## 2019-06-12 ENCOUNTER — Encounter: Payer: Self-pay | Admitting: Internal Medicine

## 2019-06-15 MED ORDER — FREESTYLE LIBRE 2 READER DEVI: 1.0000 | 1 refills | Status: DC

## 2019-06-30 ENCOUNTER — Other Ambulatory Visit: Payer: Self-pay | Admitting: Internal Medicine

## 2019-10-14 ENCOUNTER — Ambulatory Visit: Payer: Managed Care, Other (non HMO) | Admitting: Internal Medicine

## 2020-01-19 ENCOUNTER — Other Ambulatory Visit: Payer: Self-pay

## 2020-01-19 ENCOUNTER — Ambulatory Visit (INDEPENDENT_AMBULATORY_CARE_PROVIDER_SITE_OTHER): Payer: Managed Care, Other (non HMO) | Admitting: Internal Medicine

## 2020-01-19 ENCOUNTER — Encounter: Payer: Self-pay | Admitting: Internal Medicine

## 2020-01-19 VITALS — BP 124/84 | HR 104 | Ht 64.5 in | Wt >= 6400 oz

## 2020-01-19 DIAGNOSIS — Z794 Long term (current) use of insulin: Secondary | ICD-10-CM | POA: Diagnosis not present

## 2020-01-19 DIAGNOSIS — E785 Hyperlipidemia, unspecified: Secondary | ICD-10-CM | POA: Diagnosis not present

## 2020-01-19 DIAGNOSIS — E1165 Type 2 diabetes mellitus with hyperglycemia: Secondary | ICD-10-CM | POA: Diagnosis not present

## 2020-01-19 DIAGNOSIS — Z6841 Body Mass Index (BMI) 40.0 and over, adult: Secondary | ICD-10-CM

## 2020-01-19 DIAGNOSIS — Z23 Encounter for immunization: Secondary | ICD-10-CM

## 2020-01-19 LAB — POCT GLYCOSYLATED HEMOGLOBIN (HGB A1C): Hemoglobin A1C: 9.6 % — AB (ref 4.0–5.6)

## 2020-01-19 NOTE — Patient Instructions (Addendum)
Please continue: - 40 units for coffee - 40-60 units before B - 100 units before L - 100 units before D  Please return in 3-4 months.

## 2020-01-19 NOTE — Addendum Note (Signed)
Addended by: Darene Lamer T on: 01/19/2020 01:47 PM   Modules accepted: Orders

## 2020-01-19 NOTE — Progress Notes (Signed)
Patient ID: Vicki Rivera, female   DOB: 10/07/1975, 44 y.o.   MRN: [id number]  This visit occurred during the SARS-CoV-2 public health emergency.  Safety protocols were in place, including screening questions prior to the visit, additional usage of staff PPE, and extensive cleaning of exam room while observing appropriate contact time as indicated for disinfecting solutions.   HPI: Vicki Rivera is a 44 y.o.-year-old female, initially referred by her PCP, Dr.Smith, returning for follow-up for  DM2, dx in 2015, insulin-dependent, uncontrolled, without long term complications. Last appt 7 months ago.  She is on Prednisone taper x 2 weeks >> sugars higher - finishes today.  Reviewed HbA1c levels: Lab Results  Component Value Date   HGBA1C 10.0 (A) 06/10/2019   HGBA1C 10.7 (A) 02/10/2019   HGBA1C 10.3 (A) 10/10/2018  12/04/2016: HbA1c 10.1% 09/12/2015: HbA1c 10.6% 12/21/2014: 7.1%  Previously on: - Basaglar >> U200 Tresiba 30-34 units in a.m. and 40 units at night - Humalog 26-34 units before each meal, 3x a day   She tried Victoza >> N/V/AP and pancreatitis reportedly She tried Trulicity 8.33 mg weekly >> N/V She tried Invokana >> AP and severe nausea. 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  CGM parameters: - Average: 227 >> 245 >> 220 >> 252 - % active CGM time: 67% >> 18% >> 66% >> 70% of the time - Glucose management indicator: 8.6% >> 9.3% - time in range:  - very low (<54): 0% >> 0% - low (54-69): 0% >> 0% - normal range (70-180): 23% >> 19% - high sugars (181-250): 45% >> 31% - very high sugars (>250): 32% >>  50%   Previously:   Previously:  Lowest sugar was 100 >> 130 >> 86 >> 96; she has hypoglycemia awareness in the 150s. Highest sugar was 370 >> 306 >> 347 >> 400.  Glucometer: Freestyle >> [employer] >> AccuChek guide  Patient is Mostly plant-based + cheese. She is following an anti-inflammatory diet-LEAP.  -No CKD, last BUN/creatinine: 04/30/2019: Glu 226, BUN/Cr 10/0.69, GFR 92 04/24/2018: Glu 181, BUN/Cr 14/0.69, GFR 92  Lab Results  Component Value Date   BUN 12 04/16/2017   CREATININE 0.62 04/16/2017   09/12/2015, from PCP: - CMP normal, except glucose 205. BUN/creatinine 6/0.64, EGFR 102. - ACR less than 30 01/18/2014: BUN/Cr 9/0.66, eGFR 99 On lisinopril.  -+ HL; last set of lipids: 04/30/2019: 142/379/34/51 04/24/2018: 132/211/35/55 Lab Results  Component Value Date   CHOL 110 04/16/2017   HDL 32.40 (L) 04/16/2017   LDLDIRECT 58.0 04/16/2017   TRIG 232.0 (H) 04/16/2017   CHOLHDL 3 04/16/2017  09/12/2015: 129/160/43/54 On Crestor.  - last eye exam was in 04/2019: No DR (Dr. Valetta Close)   - no numbness and tingling in her feet.  She also has HTN, PCOS, anemia -on iron.  ROS: Constitutional: no weight gain/no weight loss, no fatigue, no subjective hyperthermia, no subjective hypothermia Eyes: no blurry vision, no xerophthalmia ENT: no sore throat, no nodules palpated in neck, no dysphagia, no odynophagia, no hoarseness, + congestion-sinus infection Cardiovascular: no CP/no SOB/no palpitations/no leg swelling Respiratory: no  cough/no SOB/no wheezing Gastrointestinal: no N/no V/no D/no C/no acid reflux Musculoskeletal: no muscle aches/no joint aches Skin: no rashes, no hair loss Neurological: no tremors/no numbness/no tingling/no dizziness  I reviewed pt's medications, allergies, PMH, social hx, family hx, and changes were documented in the history of present illness. Otherwise, unchanged from my initial visit note.  Past Medical History:  Diagnosis Date   . Allergy   . Anxiety   . Asthma   . Diabetes mellitus without complication ([place])    DM Type II   . Hypertension   . Insomnia   . Obesity   . PCOS (polycystic ovarian syndrome)   . Sinusitis    Past Surgical History:  Procedure Laterality Date  . CHOLECYSTECTOMY     Social History   Social History  . Marital Status: Single    Spouse Name: N/A  . Number of Children: 0   Social History Main Topics  . Smoking status: Never Smoker   . Smokeless tobacco: Not on file  . Alcohol Use: 0.0 oz/week    0 Standard drinks or equivalent per week  . Drug Use: Not on file   Social History Narrative   Single (female partner-8 yrs)   0 children   Exercise- 2 times weekly   Caffeine use - 3-4 servings daily   [employer]    Occup - [employer], [employer]   Current Outpatient Medications on File Prior to Visit  Medication Sig Dispense Refill  . albuterol (VENTOLIN HFA) 108 (90 Base) MCG/ACT inhaler Inhale 2 puffs into the lungs every 6 (six) hours as needed.     . BD PEN NEEDLE NANO 2ND GEN 32G X 4 MM MISC USE TO INJECT INSULIN 5 TIMES DAILY 500 each 1  . Blood Glucose Monitoring Suppl (ACCU-CHEK GUIDE) w/Device KIT 1 each by Does not apply route daily. 1 kit 0  . budesonide-formoterol (SYMBICORT) 160-4.5 MCG/ACT inhaler Inhale 2 puffs into the lungs 2 (two) times daily.    . Continuous Blood Gluc Receiver (FREESTYLE LIBRE 2 READER) DEVI 1 Device by Does not apply route See admin instructions. For continuous glucose monitoring 1 each 1  . Continuous Blood Gluc Sensor (FREESTYLE LIBRE 14 DAY SENSOR) MISC APPLY AS DIRECTED AND CHANGE EVERY 14 DAYS 2 each 11  . desvenlafaxine (PRISTIQ) 100 MG 24 hr tablet Take 100 mg by mouth daily.     . fluticasone (FLONASE) 50 MCG/ACT nasal spray Place 2 sprays into both nostrils daily.     Marland Kitchen glucose blood (ACCU-CHEK GUIDE) test strip Use to check blood sugars 3 times daily  100 each 12  . HUMULIN R U-500 KWIKPEN 500 UNIT/ML kwikpen INJECT 40-85 UNITS BEFORE THE 3 MEALS AS ADVISED 18 pen 7  . levocetirizine (XYZAL) 5 MG tablet Take 5 mg by mouth every evening.     Marland Kitchen lisinopril-hydrochlorothiazide (PRINZIDE,ZESTORETIC) 10-12.5 MG tablet Take 1 tablet by mouth daily.     . montelukast (SINGULAIR) 5 MG chewable tablet Chew 10 mg by mouth at bedtime.     Glory Rosebush DELICA LANCETS 22Q MISC Use to test 3 times daily as instructed. 300 each 3  . rosuvastatin (CRESTOR) 20 MG tablet Take 20 mg by mouth daily.      No current facility-administered medications on file prior to visit.   Allergies  Allergen Reactions  . Amoxicillin-Pot Clavulanate Hives  . Benzonatate Other (See Comments)  . Canagliflozin Nausea Only  . Ceftin  [Cefuroxime]  Hives  . Ciprofloxacin Hives  . Clarithromycin Other (See Comments)  . Emetrol Nausea Only  . Metformin Nausea Only  . Sulfa Antibiotics Hives   Family History  Problem Relation Age of Onset  . Hypertension Mother   . Diabetes Mother   . Hyperlipidemia Mother   . Parkinson's disease Mother   . Hyperlipidemia Father   . Diabetes Sister        TYPE I   . Diabetes Maternal Grandmother   . Dementia Maternal Grandfather   . Heart disease Paternal Grandfather        CAD; MI    PE: BP 124/84   Pulse (!) 104   Ht 5' 4.5" (1.638 m)   Wt (!) 401 lb (181.9 kg)   BMI 67.77 kg/m  Wt Readings from Last 3 Encounters:  01/19/20 (!) 401 lb (181.9 kg)  06/10/19 (!) 401 lb (181.9 kg)  02/10/19 (!) 394 lb (178.7 kg)   Constitutional: overweight, in NAD Eyes: PERRLA, EOMI, no exophthalmos ENT: moist mucous membranes, no thyromegaly, no cervical lymphadenopathy Cardiovascular: Tachycardia, RR, No MRG Respiratory: CTA B Gastrointestinal: abdomen soft, NT, ND, BS+ Musculoskeletal: no deformities, strength intact in all 4 Skin: moist, warm, no rashes Neurological: no tremor with outstretched hands, DTR normal in all  4  ASSESSMENT: 1. DM2, insulin-dependent, uncontrolled, without long term complications, but with hyperglycemia  2. HL  3. Obesity class III  PLAN:  1. Patient with uncontrolled type 2 diabetes,, on U500 insulin regimen, with still poor control.  In the past, we tried a GLP-1 receptor agonist, Metformin, SGLT2 inhibitors, but she was not able to tolerate them (please see HPI).  She would not want to retry these.  Initially, her sugars improved after switching to a more concentrated insulin, but afterwards, they started to worsen again.  At last visit, we increased her U500 insulin doses and discussed about improving her diet and losing weight. -For the last 2 weeks, she was on a steroid taper and sugars were higher. CGM interpretation: - at today's visit, we reviewed together his CGM tracings and, indeed, her sugars are quite high, higher than before.  For the last 2 weeks, only 19% of her blood sugars are in target range and 81% or higher than this target.  Her average blood sugar is 252 and projected HbA1c is 9.3%.  She did increase the doses of her insulin when starting steroids and when she saw that her sugars increased significantly.  She is actually using almost double doses compared to before.  Despite this, sugars are still elevated.  However, tomorrow she will come off the steroids.  I advised her that, since sugars are so high and they increase after every meal, to continue with the current doses of U500 insulin, although they are much higher than before.  I also encouraged her that if the sugars remain elevated after meals, to continue to increase the U500 insulin doses.  We have to be careful with her predinner dose as she is dropping her sugars overnight, but not to hypoglycemic levels. -In the future, we may even think about an insulin pump with off label U500 insulin if sugars do not improve - I suggested to:  Patient Instructions  Please continue: - 40 units for coffee - 40-60  units before B - 100 units before L - 100 units before D  Please return in 3-4 months.  - we checked her HbA1c: 9.6% (slightly better) - advised to check sugars at different times  of the day - 4x a day, rotating check times - advised for yearly eye exams >> she is UTD - return to clinic in 3-4 months   2. HL -Reviewed latest lipid panel from 04/2019: LDL at goal, triglycerides higher than before, HDL low -Continue Crestor 20 without side effects  3.Obesity class III -Unfortunately, we need to continue to use the concentrated insulin for her due to the high insulin resistance -this is conducive to weight gain -At last visit, she admitted eating too many carbs -At last visit, she gained 7 pounds from the previous visit.  Weight stable since then.  + Flu shot today.  Philemon Kingdom, MD PhD [facility] Endocrinology

## 2020-02-23 ENCOUNTER — Other Ambulatory Visit: Payer: Self-pay | Admitting: Internal Medicine

## 2020-03-08 ENCOUNTER — Telehealth: Payer: Self-pay | Admitting: *Deleted

## 2020-03-08 NOTE — Telephone Encounter (Signed)
Keshawn called from [facility] to check on a piror authorization request that was faxed over  Please advise [phone]

## 2020-03-09 NOTE — Telephone Encounter (Signed)
Completed form was faxed 03/09/20. Confirmation fax received.

## 2020-03-10 ENCOUNTER — Telehealth: Payer: Self-pay | Admitting: *Deleted

## 2020-03-10 NOTE — Telephone Encounter (Signed)
Thurston Hole called [employer] regarding a prior auth on newpod dash. Please advise [phone]

## 2020-03-11 NOTE — Telephone Encounter (Signed)
[facility] states that they received that  [place] forms for the Omn[employer] dash but they were the wrong forms. They will send over correct forms to be filled out.

## 2020-04-12 ENCOUNTER — Other Ambulatory Visit: Payer: Self-pay | Admitting: Internal Medicine

## 2020-04-28 ENCOUNTER — Other Ambulatory Visit: Payer: Self-pay

## 2020-04-28 ENCOUNTER — Ambulatory Visit: Payer: Managed Care, Other (non HMO) | Admitting: Internal Medicine

## 2020-04-28 ENCOUNTER — Encounter: Payer: Self-pay | Admitting: Internal Medicine

## 2020-04-28 VITALS — BP 140/90 | HR 113 | Ht 64.0 in | Wt >= 6400 oz

## 2020-04-28 DIAGNOSIS — E1165 Type 2 diabetes mellitus with hyperglycemia: Secondary | ICD-10-CM

## 2020-04-28 DIAGNOSIS — E785 Hyperlipidemia, unspecified: Secondary | ICD-10-CM

## 2020-04-28 DIAGNOSIS — Z6841 Body Mass Index (BMI) 40.0 and over, adult: Secondary | ICD-10-CM

## 2020-04-28 DIAGNOSIS — Z794 Long term (current) use of insulin: Secondary | ICD-10-CM

## 2020-04-28 LAB — COMPREHENSIVE METABOLIC PANEL
ALT: 19 U/L (ref 0–35)
AST: 20 U/L (ref 0–37)
Albumin: 4.1 g/dL (ref 3.5–5.2)
Alkaline Phosphatase: 50 U/L (ref 39–117)
BUN: 8 mg/dL (ref 6–23)
CO2: 29 mEq/L (ref 19–32)
Calcium: 9.3 mg/dL (ref 8.4–10.5)
Chloride: 96 mEq/L (ref 96–112)
Creatinine, Ser: 0.8 mg/dL (ref 0.40–1.20)
GFR: 88.07 mL/min (ref >60.00)
Glucose, Bld: 352 mg/dL — ABNORMAL HIGH (ref 70–99)
Potassium: 4 mEq/L (ref 3.5–5.1)
Sodium: 132 mEq/L — ABNORMAL LOW (ref 135–145)
Total Bilirubin: 0.7 mg/dL (ref 0.2–1.2)
Total Protein: 7.2 g/dL (ref 6.0–8.3)

## 2020-04-28 LAB — POCT GLYCOSYLATED HEMOGLOBIN (HGB A1C): Hemoglobin A1C: 10.7 % — AB (ref 4.0–5.6)

## 2020-04-28 LAB — LIPID PANEL
Cholesterol: 140 mg/dL (ref 0–200)
HDL: 38.1 mg/dL — ABNORMAL LOW (ref >39.00)
NonHDL: 102.26
Total CHOL/HDL Ratio: 4
Triglycerides: 265 mg/dL — ABNORMAL HIGH (ref 0.0–149.0)
VLDL: 53 mg/dL — ABNORMAL HIGH (ref 0.0–40.0)

## 2020-04-28 LAB — MICROALBUMIN / CREATININE URINE RATIO
Creatinine,U: 65 mg/dL
Microalb Creat Ratio: 1.4 mg/g (ref 0.0–30.0)
Microalb, Ur: 0.9 mg/dL (ref 0.0–1.9)

## 2020-04-28 LAB — LDL CHOLESTEROL, DIRECT: Direct LDL: 83 mg/dL

## 2020-04-28 NOTE — Patient Instructions (Addendum)
Please change: - 60 units at waking up/40-60 units before B - 100-110 units before L - 60-70 units before D  Stop at the lab.  Please return in 3-4 months.

## 2020-04-28 NOTE — Progress Notes (Signed)
Patient ID: Vicki Rivera, female   DOB: April 02, 1976, 45 y.o.   MRN: [id number]  This visit occurred during the SARS-CoV-2 public health emergency.  Safety protocols were in place, including screening questions prior to the visit, additional usage of staff PPE, and extensive cleaning of exam room while observing appropriate contact time as indicated for disinfecting solutions.   HPI: Vicki Rivera is a 45 y.o.-year-old female, initially referred by her PCP, Dr.Smith, returning for follow-up for  DM2, dx in 2015, insulin-dependent, uncontrolled, without long term complications. Last appt 3 months ago.  Reviewed HbA1c levels: Lab Results  Component Value Date   HGBA1C 9.6 (A) 01/19/2020   HGBA1C 10.0 (A) 06/10/2019   HGBA1C 10.7 (A) 02/10/2019  12/04/2016: HbA1c 10.1% 09/12/2015: HbA1c 10.6% 12/21/2014: 7.1%  Previously on: - Basaglar >> U200 Tresiba 30-34 units in a.m. and 40 units at night - Humalog 26-34 units before each meal, 3x a day   She tried Victoza >> N/V/AP and pancreatitis reportedly She tried Trulicity 7.12 mg weekly >> N/V She tried Invokana >> AP and severe nausea. 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  CGM parameters: - Average: 227 >> 245 >> 220 >> 252 >> 286 - % active CGM time: 67% >> 18% >> 66% >> 70% >> 76% of the time - Glucose management indicator: 8.6% >> 9.3% >> 10.2% - time in range:  - very low (<54): 0% >> 0% >> 0% - low (54-69): 0% >> 0% >> 0% - normal range (70-180): 23% >> 19% >> 6% - high sugars (181-250): 45% >> 31% >> 26% - very high sugars (>250): 32% >> 50% >>  68%    Previously:   Previously:   Previously:  Lowest sugar was 86 >> 96 >>  110 she has hypoglycemia awareness in the 150s Highest sugar was 347 >> 400 >> 400.  Glucometer: Freestyle >> [employer] >> AccuChek guide  Patient is Mostly plant-based + cheese. She is following an anti-inflammatory diet-LEAP.  -No CKD, last BUN/creatinine: 04/30/2019: Glu 226, BUN/Cr 10/0.69, GFR 92 04/24/2018: Glu 181, BUN/Cr 14/0.69, GFR 92  Lab Results  Component Value Date   BUN 12 04/16/2017   CREATININE 0.62 04/16/2017   09/12/2015, from PCP: - CMP normal, except glucose 205. BUN/creatinine 6/0.64, EGFR 102. - ACR less than 30 01/18/2014: BUN/Cr 9/0.66, eGFR 99 On lisinopril.  -+ HL; last set of lipids: 04/30/2019: 142/379/34/51 04/24/2018: 132/211/35/55 Lab Results  Component Value Date   CHOL 110 04/16/2017   HDL 32.40 (L) 04/16/2017   LDLDIRECT 58.0 04/16/2017   TRIG 232.0 (H) 04/16/2017   CHOLHDL 3 04/16/2017  09/12/2015: 129/160/43/54 On Crestor 20.  - last eye exam was in 04/2019: No DR (Dr. Valetta Close)   -No numbness and tingling in her feet.  She also has HTN, PCOS, anemia -on iron.  ROS: Constitutional: no weight gain/no weight loss, no fatigue, no subjective hyperthermia, no subjective hypothermia Eyes: no blurry vision, no xerophthalmia ENT: no sore throat, no nodules palpated in neck, no dysphagia, no odynophagia, no hoarseness Cardiovascular: no CP/no SOB/no palpitations/no leg swelling Respiratory: no cough/no  SOB/no wheezing Gastrointestinal: no N/no V/no D/no C/no acid reflux Musculoskeletal: no muscle aches/no joint aches Skin: no rashes, no hair loss Neurological: no tremors/no numbness/no tingling/no dizziness  I reviewed pt's medications, allergies, PMH, social hx, family hx, and changes were documented in the history of present illness. Otherwise, unchanged from my initial visit note.  Past Medical History:  Diagnosis Date   Allergy     Anxiety    Asthma    Diabetes mellitus without complication ([place])    DM Type II    Hypertension    Insomnia    Obesity    PCOS (polycystic ovarian syndrome)    Sinusitis    Past Surgical History:  Procedure Laterality Date   CHOLECYSTECTOMY     Social History   Social History   Marital Status: Single    Spouse Name: N/A   Number of Children: 0   Social History Main Topics   Smoking status: Never Smoker    Smokeless tobacco: Not on file   Alcohol Use: 0.0 oz/week    0 Standard drinks or equivalent per week   Drug Use: Not on file   Social History Narrative   Single (female partner-8 yrs)   0 children   Exercise- 2 times weekly   Caffeine use - 3-4 servings daily   [employer]    Occup - [employer], [employer]   Current Outpatient Medications on File Prior to Visit  Medication Sig Dispense Refill   albuterol (VENTOLIN HFA) 108 (90 Base) MCG/ACT inhaler Inhale 2 puffs into the lungs every 6 (six) hours as needed.      BD PEN NEEDLE NANO 2ND GEN 32G X 4 MM MISC USE TO INJECT INSULIN 5 TIMES DAILY 500 each 1   Blood Glucose Monitoring Suppl (ACCU-CHEK GUIDE) w/Device KIT 1 each by Does not apply route daily. 1 kit 0   budesonide-formoterol (SYMBICORT) 160-4.5 MCG/ACT inhaler Inhale 2 puffs into the lungs 2 (two) times daily.     Continuous Blood Gluc Receiver (FREESTYLE LIBRE 2 READER) DEVI 1 Device by Does not apply route See admin instructions. For continuous glucose monitoring 1 each 1   Continuous Blood Gluc Sensor (FREESTYLE LIBRE 14 DAY SENSOR) MISC APPLY AS DIRECTED AND CHANGE EVERY 14 DAYS 2 each 11   desvenlafaxine (PRISTIQ) 100 MG 24 hr tablet Take 100 mg by mouth daily.      fluticasone (FLONASE) 50 MCG/ACT nasal spray Place 2 sprays into both nostrils daily.      glucose blood (ACCU-CHEK GUIDE) test strip Use to check blood sugars 3 times daily 100 each 12    HUMULIN R U-500 KWIKPEN 500 UNIT/ML kwikpen INJECT 40-85 UNITS BEFORE THE 3 MEALS AS ADVISED 18 mL 7   levocetirizine (XYZAL) 5 MG tablet Take 5 mg by mouth every evening.      lisinopril-hydrochlorothiazide (PRINZIDE,ZESTORETIC) 10-12.5 MG tablet Take 1 tablet by mouth daily.      montelukast (SINGULAIR) 5 MG chewable tablet Chew 10 mg by mouth at bedtime.      ONETOUCH DELICA LANCETS 93Z MISC Use to test 3 times daily as instructed. 300 each 3   rosuvastatin (CRESTOR) 20 MG tablet Take 20 mg by mouth daily.      No current facility-administered medications on file prior to visit.   Allergies  Allergen Reactions   Amoxicillin-Pot Clavulanate Hives   Benzonatate Other (See Comments)   Canagliflozin Nausea Only   Ceftin  [Cefuroxime] Hives  Ciprofloxacin Hives   Clarithromycin Other (See Comments)   Emetrol Nausea Only   Metformin Nausea Only   Sulfa Antibiotics Hives   Family History  Problem Relation Age of Onset   Hypertension Mother    Diabetes Mother    Hyperlipidemia Mother    Parkinson's disease Mother    Hyperlipidemia Father    Diabetes Sister        TYPE I    Diabetes Maternal Grandmother    Dementia Maternal Grandfather    Heart disease Paternal Grandfather        CAD; MI    PE: BP 140/90    Pulse (!) 113    Ht 5' 4"  (1.626 m)    Wt (!) 416 lb (188.7 kg)    SpO2 98%    BMI 71.41 kg/m  Wt Readings from Last 3 Encounters:  04/28/20 (!) 416 lb (188.7 kg)  01/19/20 (!) 401 lb (181.9 kg)  06/10/19 (!) 401 lb (181.9 kg)   Constitutional: overweight, in NAD Eyes: PERRLA, EOMI, no exophthalmos ENT: moist mucous membranes, no thyromegaly, no cervical lymphadenopathy Cardiovascular: tachycardia, RR, No MRG Respiratory: CTA B Gastrointestinal: abdomen soft, NT, ND, BS+ Musculoskeletal: no deformities, strength intact in all 4 Skin: moist, warm, no rashes Neurological: no tremor with outstretched hands, DTR normal in all 4  ASSESSMENT: 1.  DM2, insulin-dependent, uncontrolled, without long term complications, but with hyperglycemia  2. HL  3. Obesity class III  PLAN:  1. Patient with uncontrolled type 2 diabetes, on U500 insulin regimen, with still poor control.  HbA1c at last visit was slightly better, at 9.6%.  At that time, sugars were higher for 2 weeks as she has been on a steroid taper, despite increasing the insulin doses.  However, she was preparing to come off steroid at that time so we did not change the regimen.  Of note, she was not able to tolerate Metformin, GLP-1 receptor agonist, and SGLT2 inhibitors. CGM interpretation: -At today's visit, we reviewed her CGM downloads: It appears that only 6% of values are in target range (goal >70%), while 94% are higher than 180 (goal <25%), and 0% are lower than 70 (goal <4%).  The calculated average blood sugar is 286.  The projected HbA1c for the next 3 months ([facility]MI) is 10.2%. -Reviewing the CGM trends, almost all her blood sugars are above target, lowering the morning and then increasing around 9 AM even in the absence of drinking coffee or eating breakfast.  Sugars remain elevated throughout the day and they are even higher after dinner, while decreasing precipitously after 12 AM.  Upon questioning, she is not bolusing for correction in the middle of the night.  Therefore, I suspect that the decrease in blood sugars overnight is due to excess insulin before dinner.  I advised her to reduce the dinnertime dose. -She tells me that she does not eat breakfast most days but she usually does drink coffee and boluses 40 units before this.  If she does not drink coffee, she skips to 40 units.  Since her sugars increase significantly in the morning with lower without coffee, I advised her to go to 60 units when she wakes up.  I strongly encouraged her to have breakfast and to bolus for a 40 to 60 units. I also suggested to increase her insulin with lunch to hopefully get her evening sugars  under better control -We also discussed about an insulin pump.  She discussed with her sister, who is a diabetes educator  about this and she recommended an Omni pod pump.  She will look into this and let me know if she did not want to start it. - I suggested to:  Patient Instructions  Please change: - 60 units at waking up/40-60 units before B - 100-110 units before L - 60-70 units before D  Stop at the lab.  Please return in 3-4 months.  - we checked her HbA1c: 10.7% (higher) - advised to check sugars at different times of the day - 4x a day, rotating check times - advised for yearly eye exams >> she is UTD - will check annual labs today - return to clinic in 3-4 months   2. HL -Reviewed latest lipid panel from 04/2019: LDL at goal, triglycerides higher than before, HDL low -Continues Crestor 20 without side effects -We will check a lipid panel today  3.Obesity class III -Unfortunately, we need to continue to use concentrated insulin for her due to the high insulin resistance, and this is conducive to weight gain -Weight was stable at last visit, previously gained 7 pounds - she gained 15 lbs since last OV, however, she mentioned that she did not change her diet  Component     Latest Ref Rng & Units 04/28/2020  Sodium     135 - 145 mEq/L 132 (L)  Potassium     3.5 - 5.1 mEq/L 4.0  Chloride     96 - 112 mEq/L 96  CO2     19 - 32 mEq/L 29  Glucose     70 - 99 mg/dL 352 (H)  BUN     6 - 23 mg/dL 8  Creatinine     0.40 - 1.20 mg/dL 0.80  Total Bilirubin     0.2 - 1.2 mg/dL 0.7  Alkaline Phosphatase     39 - 117 U/L 50  AST     0 - 37 U/L 20  ALT     0 - 35 U/L 19  Total Protein     6.0 - 8.3 g/dL 7.2  Albumin     3.5 - 5.2 g/dL 4.1  GFR     >60.00 mL/min 88.07  Calcium     8.4 - 10.5 mg/dL 9.3  Cholesterol     0 - 200 mg/dL 140  Triglycerides     0.0 - 149.0 mg/dL 265.0 (H)  HDL Cholesterol     >39.00 mg/dL 38.10 (L)  VLDL     0.0 - 40.0 mg/dL 53.0 (H)   Total CHOL/HDL Ratio      4  NonHDL      102.26  Microalb, Ur     0.0 - 1.9 mg/dL 0.9  Creatinine,U     mg/dL 65.0  MICROALB/CREAT RATIO     0.0 - 30.0 mg/g 1.4  Hemoglobin A1C     4.0 - 5.6 % 10.7 (A)  Direct LDL     mg/dL 83.0   Blood sugar very high.  LDL cholesterol is at goal, while triglycerides are also high and HDL is low.  Philemon Kingdom, MD PhD [facility] Endocrinology

## 2020-05-27 IMAGING — MG DIGITAL SCREENING BILAT W/ TOMO
8 of 15 series · 8 of 39 positions shown · non-contrast
Comparison: None.

CLINICAL DATA: Screening.

EXAM:
DIGITAL SCREENING BILATERAL MAMMOGRAM WITH TOMO AND CAD

[R CC synth-2D (1 of 2)]
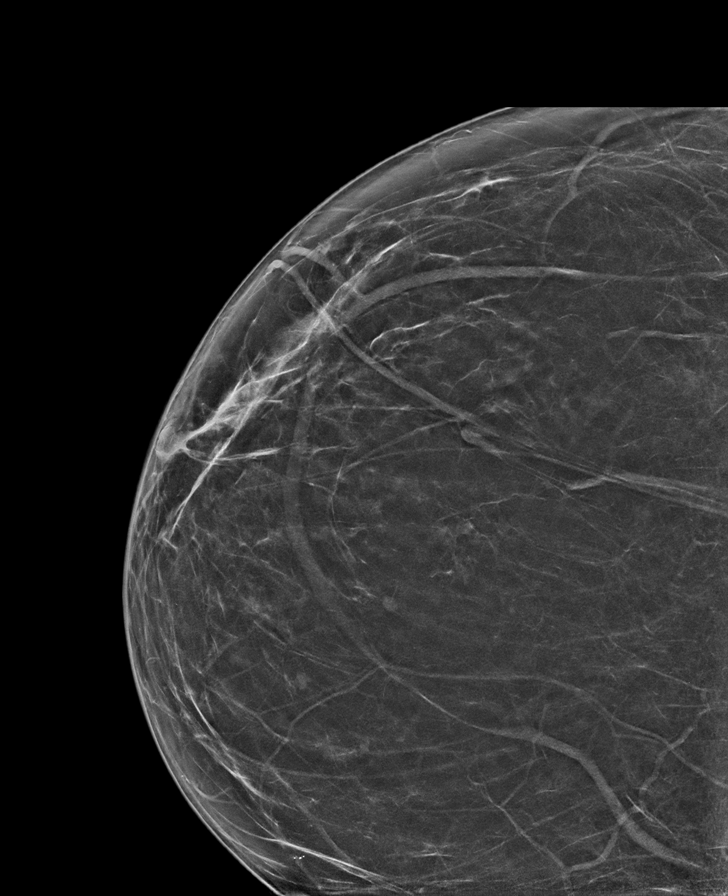

[L CC synth-2D (1 of 2)]
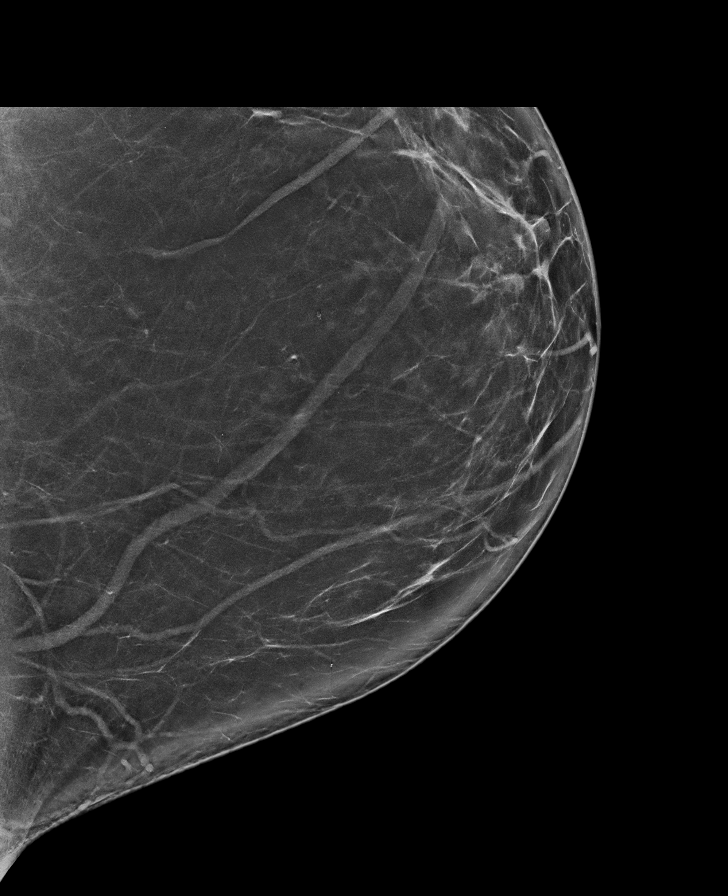

[R MLO synth-2D (1 of 2)]
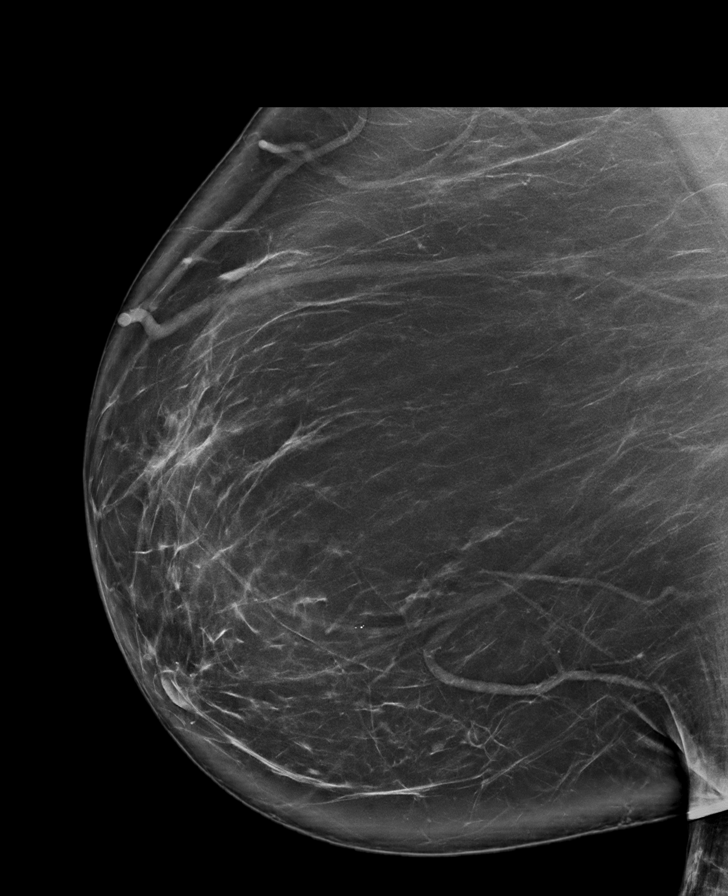

[L MLO synth-2D (1 of 2)]
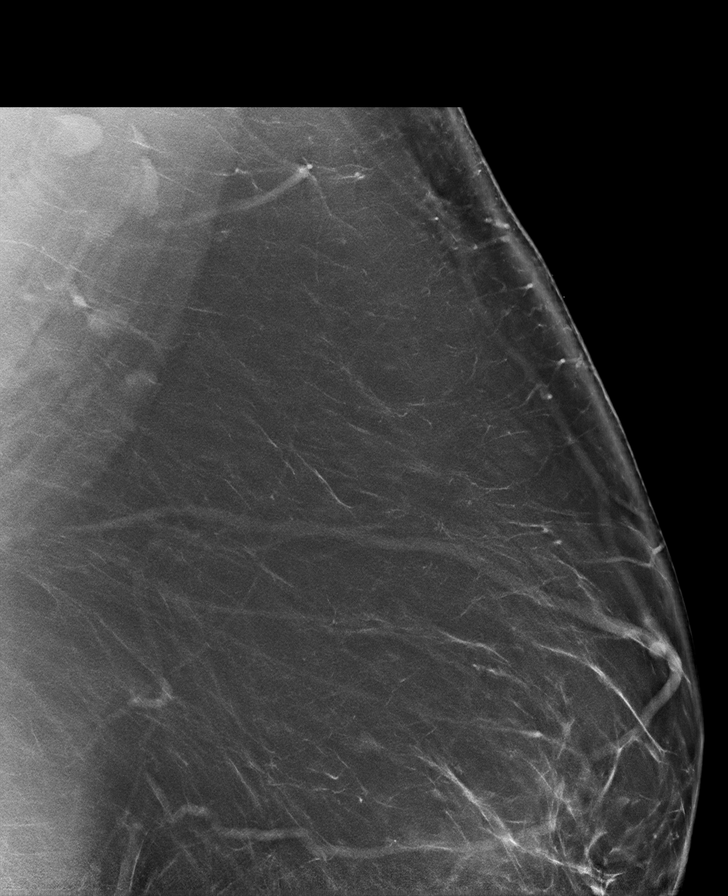

[R MLO synth-2D (2 of 2)]
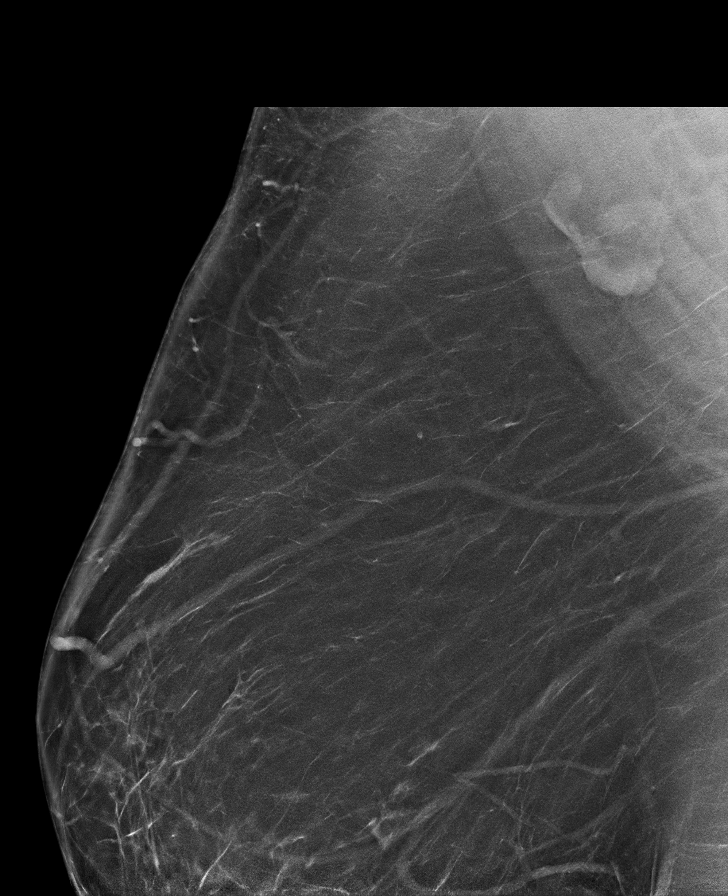

[L CC synth-2D (2 of 2)]
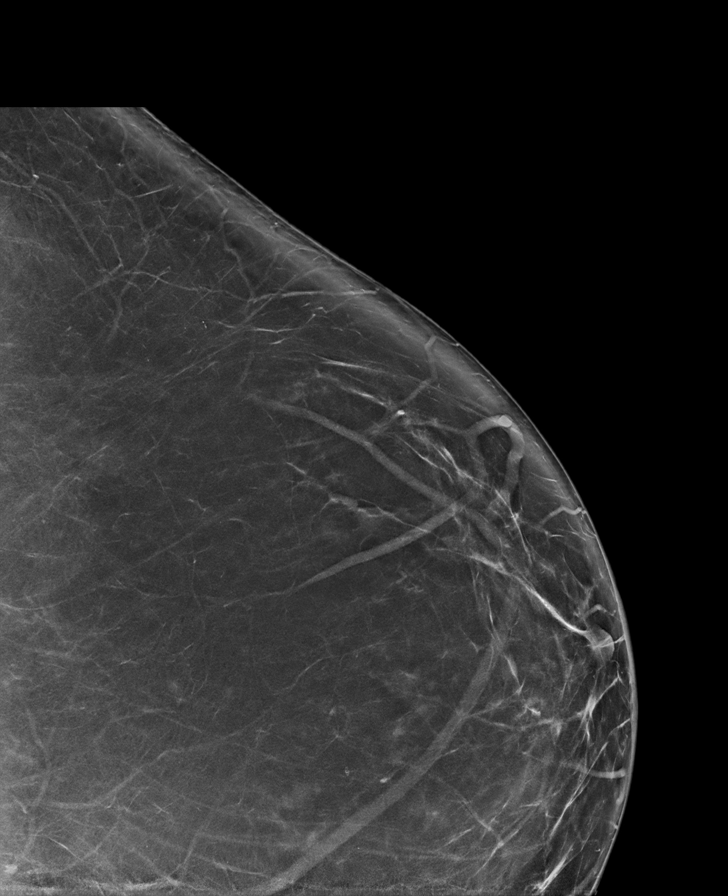

[R CC synth-2D (2 of 2)]
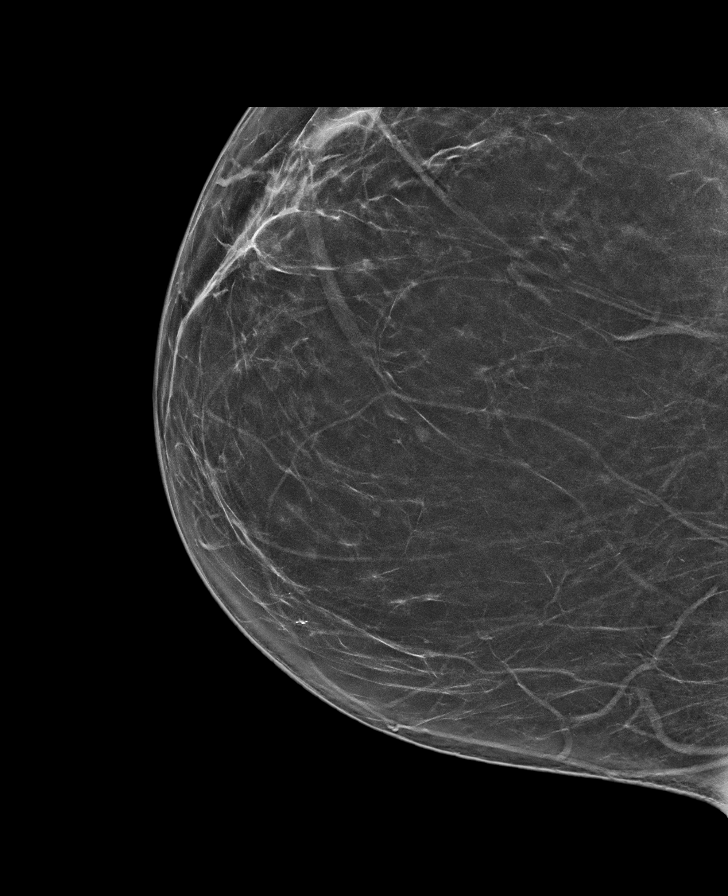

[L MLO synth-2D (2 of 2)]
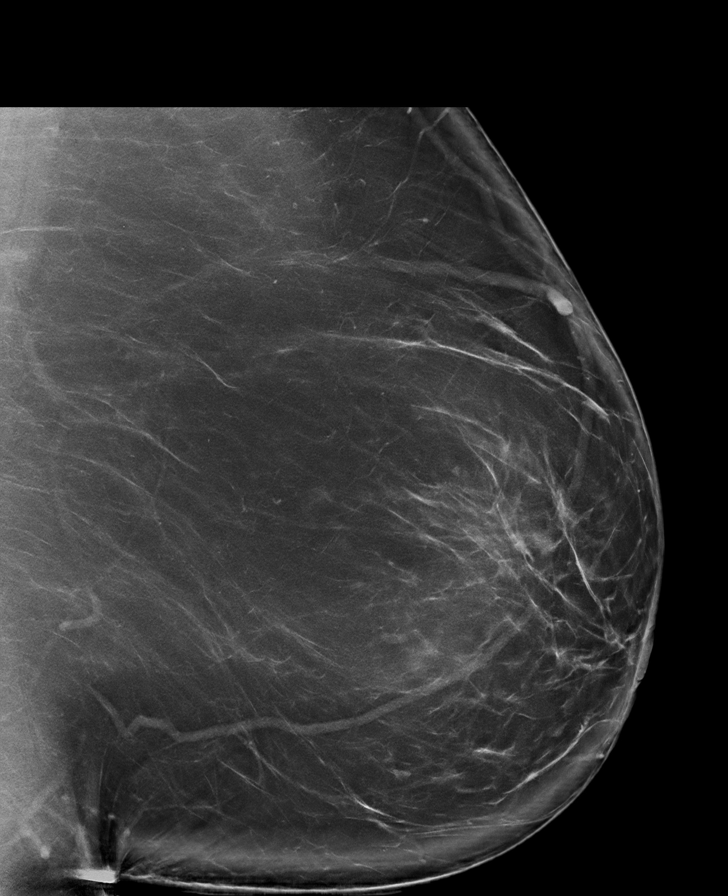

[8 of 39 positions shown; findings below may reference images not displayed]

ACR Breast Density Category b: There are scattered areas of
fibroglandular density.
FINDINGS: There are no findings suspicious for malignancy. Images were
processed with CAD.
IMPRESSION: No mammographic evidence of malignancy. A result letter of this
screening mammogram will be mailed directly to the patient.

RECOMMENDATION:
Screening mammogram in one year. (Code:Y5-G-EJ6)

BI-RADS CATEGORY  1: Negative.

## 2020-06-03 ENCOUNTER — Other Ambulatory Visit: Payer: Self-pay | Admitting: Internal Medicine

## 2020-08-23 ENCOUNTER — Telehealth: Payer: Self-pay

## 2020-08-23 ENCOUNTER — Encounter: Payer: Self-pay | Admitting: Physician Assistant

## 2020-08-23 ENCOUNTER — Other Ambulatory Visit: Payer: Self-pay | Admitting: Physician Assistant

## 2020-08-23 DIAGNOSIS — E119 Type 2 diabetes mellitus without complications: Secondary | ICD-10-CM | POA: Insufficient documentation

## 2020-08-23 DIAGNOSIS — E282 Polycystic ovarian syndrome: Secondary | ICD-10-CM | POA: Insufficient documentation

## 2020-08-23 DIAGNOSIS — U071 COVID-19: Secondary | ICD-10-CM

## 2020-08-23 DIAGNOSIS — J45909 Unspecified asthma, uncomplicated: Secondary | ICD-10-CM | POA: Insufficient documentation

## 2020-08-23 DIAGNOSIS — I10 Essential (primary) hypertension: Secondary | ICD-10-CM | POA: Insufficient documentation

## 2020-08-23 MED ORDER — NIRMATRELVIR/RITONAVIR (PAXLOVID)TABLET: 3.0000 | ORAL_TABLET | Freq: Two times a day (BID) | ORAL | 0 refills | Status: DC

## 2020-08-23 NOTE — Telephone Encounter (Signed)
Called to discuss with patient about COVID-19 symptoms and the use of one of the available treatments for those with mild to moderate Covid symptoms and at a high risk of hospitalization.  Pt appears to qualify for outpatient treatment due to co-morbid conditions and/or a member of an at-risk group in accordance with the FDA Emergency Use Authorization.    Symptom onset: Unknown Vaccinated: Unknown Booster? Unknown Immunocompromised? No Qualifiers: Obesity NIH Criteria: Tier 4  Unable to reach pt - Left message and call back number [phone].  Esther Hardy

## 2020-08-23 NOTE — Progress Notes (Signed)
Outpatient Oral COVID Treatment Note  I connected with Vicki Rivera on 08/23/2020/4:54 PM by telephone and verified that I am speaking with the correct person using two identifiers.  I discussed the limitations, risks, security, and privacy concerns of performing an evaluation and management service by telephone and the availability of in person appointments. I also discussed with the patient that there may be a patient responsible charge related to this service. The patient expressed understanding and agreed to proceed.  Patient location: home Provider location: office   Diagnosis: COVID-19 infection  Purpose of visit: Discussion of potential use of Molnupiravir or Paxlovid, a new treatment for mild to moderate COVID-19 viral infection in non-hospitalized patients.   Subjective: Patient is a 45 y.o. female who has been diagnosed with COVID 19 viral infection.  Their symptoms began on 5/20 with cough and cold.    Past Medical History:  Diagnosis Date  . Allergy   . Anxiety   . Asthma   . Diabetes mellitus without complication ([place])    DM Type II   . Hypertension   . Insomnia   . Morbid obesity ([place])   . PCOS (polycystic ovarian syndrome)   . Sinusitis     Allergies  Allergen Reactions  . Amoxicillin-Pot Clavulanate Hives  . Benzonatate Other (See Comments)  . Canagliflozin Nausea Only  . Ceftin  [Cefuroxime] Hives  . Ciprofloxacin Hives  . Clarithromycin Other (See Comments)  . Emetrol Nausea Only  . Metformin Nausea Only  . Sulfa Antibiotics Hives     Current Outpatient Medications:  .  albuterol (VENTOLIN HFA) 108 (90 Base) MCG/ACT inhaler, Inhale 2 puffs into the lungs every 6 (six) hours as needed. , Disp: , Rfl:  .  BD PEN NEEDLE NANO 2ND GEN 32G X 4 MM MISC, USE TO INJECT INSULIN 5 TIMES DAILY, Disp: 500 each, Rfl: 1 .  Blood Glucose Monitoring Suppl (ACCU-CHEK GUIDE) w/Device KIT, 1 each by Does not apply route daily., Disp: 1 kit, Rfl: 0 .  budesonide-formoterol  (SYMBICORT) 160-4.5 MCG/ACT inhaler, Inhale 2 puffs into the lungs 2 (two) times daily., Disp: , Rfl:  .  Continuous Blood Gluc Receiver (FREESTYLE LIBRE 2 READER) DEVI, 1 Device by Does not apply route See admin instructions. For continuous glucose monitoring, Disp: 1 each, Rfl: 1 .  Continuous Blood Gluc Sensor (FREESTYLE LIBRE 2 SENSOR) MISC, APPLY AS DIRECTED AND CHANGE EVERY 14 DAYS, Disp: 2 each, Rfl: 3 .  desvenlafaxine (PRISTIQ) 100 MG 24 hr tablet, Take 100 mg by mouth daily. , Disp: , Rfl:  .  fluticasone (FLONASE) 50 MCG/ACT nasal spray, Place 2 sprays into both nostrils daily. , Disp: , Rfl:  .  glucose blood (ACCU-CHEK GUIDE) test strip, Use to check blood sugars 3 times daily, Disp: 100 each, Rfl: 12 .  HUMULIN R U-500 KWIKPEN 500 UNIT/ML kwikpen, INJECT 40-85 UNITS BEFORE THE 3 MEALS AS ADVISED, Disp: 18 mL, Rfl: 7 .  levocetirizine (XYZAL) 5 MG tablet, Take 5 mg by mouth every evening. , Disp: , Rfl:  .  lisinopril-hydrochlorothiazide (PRINZIDE,ZESTORETIC) 10-12.5 MG tablet, Take 1 tablet by mouth daily. , Disp: , Rfl:  .  montelukast (SINGULAIR) 5 MG chewable tablet, Chew 10 mg by mouth at bedtime. , Disp: , Rfl:  .  ONETOUCH DELICA LANCETS 16R MISC, Use to test 3 times daily as instructed., Disp: 300 each, Rfl: 3 .  rosuvastatin (CRESTOR) 20 MG tablet, Take 20 mg by mouth daily. , Disp: , Rfl:  .  sulfamethoxazole-trimethoprim (BACTRIM DS) 800-160 MG tablet, Take 1 tablet by mouth 2 (two) times daily., Disp: , Rfl:   Objective: Patient sounds stable.  They are in no apparent distress.  Breathing is non labored.  Mood and behavior are normal.  Laboratory Data:  No results found for this or any previous visit (from the past 2160 hour(s)).   Assessment: 45 y.o. female with mild/moderate COVID 19 viral infection diagnosed on 5/22 at high risk for progression to severe COVID 19.  Plan:  This patient is a 45 y.o. female that meets the following criteria for Emergency Use  Authorization of: Paxlovid 1. Age >12 yr AND > 40 kg 2. SARS-COV-2 positive test 3. Symptom onset < 5 days 4. Mild-to-moderate COVID disease with high risk for severe progression to hospitalization or death  I have spoken and communicated the following to the patient or parent/caregiver regarding: 1. Paxlovid is an unapproved drug that is authorized for use under an Emergency Use Authorization.  2. There are no adequate, approved, available products for the treatment of COVID-19 in adults who have mild-to-moderate COVID-19 and are at high risk for progressing to severe COVID-19, including hospitalization or death. 3. Other therapeutics are currently authorized. For additional information on all products authorized for treatment or prevention of COVID-19, please see [email or web address].  4. There are benefits and risks of taking this treatment as outlined in the "Fact Sheet for Patients and Caregivers."  5. "Fact Sheet for Patients and Caregivers" was reviewed with patient. A hard copy will be provided to patient from pharmacy prior to the patient receiving treatment. 6. Patients should continue to self-isolate and use infection control measures (e.g., wear mask, isolate, social distance, avoid sharing personal items, clean and disinfect "high touch" surfaces, and frequent handwashing) according to CDC guidelines.  7. The patient or parent/caregiver has the option to accept or refuse treatment. 8. Patient medication history was reviewed for potential drug interactions:Interaction with home meds: flonase, crestor and symbicort 9. Patient's GFR was calculated to be >60, and they were therefore prescribed Normal dose (GFR>60) - nirmatrelvir 131m tab (2 tablet) by mouth twice daily AND ritonavir 1045mtab (1 tablet) by mouth twice daily   After reviewing above information with the patient, the  patient agrees to receive Paxlovid.  Follow up instructions:    . Take prescription BID x 5 days as directed . Reach out to pharmacist for counseling on medication if desired . For concerns regarding further COVID symptoms please follow up with your PCP or urgent care . For urgent or life-threatening issues, seek care at your local emergency department  The patient was provided an opportunity to ask questions, and all were answered. The patient agreed with the plan and demonstrated an understanding of the instructions.   Script sent to [facility] and opted to pick up RX.  The patient was advised to call their PCP or seek an in-person evaluation if the symptoms worsen or if the condition fails to improve as anticipated.   I provided 10 minutes of non face-to-face telephone visit time during this encounter, and > 50% was spent counseling as documented under my assessment & plan.  KaAngelena FormPA-C 08/23/2020 /4:54 PM

## 2020-09-01 ENCOUNTER — Encounter: Payer: Self-pay | Admitting: Internal Medicine

## 2020-09-01 ENCOUNTER — Other Ambulatory Visit: Payer: Self-pay

## 2020-09-01 ENCOUNTER — Ambulatory Visit: Payer: Managed Care, Other (non HMO) | Admitting: Internal Medicine

## 2020-09-01 VITALS — BP 138/88 | HR 101 | Ht 64.0 in | Wt >= 6400 oz

## 2020-09-01 DIAGNOSIS — E1165 Type 2 diabetes mellitus with hyperglycemia: Secondary | ICD-10-CM

## 2020-09-01 DIAGNOSIS — E785 Hyperlipidemia, unspecified: Secondary | ICD-10-CM

## 2020-09-01 DIAGNOSIS — Z794 Long term (current) use of insulin: Secondary | ICD-10-CM

## 2020-09-01 DIAGNOSIS — Z6841 Body Mass Index (BMI) 40.0 and over, adult: Secondary | ICD-10-CM

## 2020-09-01 LAB — POCT GLYCOSYLATED HEMOGLOBIN (HGB A1C): Hemoglobin A1C: 9.6 % — AB (ref 4.0–5.6)

## 2020-09-01 MED ORDER — HUMULIN R U-500 KWIKPEN 500 UNIT/ML ~~LOC~~ SOPN: 70.0000 [IU] | PEN_INJECTOR | Freq: Three times a day (TID) | SUBCUTANEOUS | 3 refills | Status: DC

## 2020-09-01 NOTE — Patient Instructions (Addendum)
Please increase: - 100 units at waking up - 130 -140 units before L - 70-100 units before D  Please schedule an appt with Cristy Folks for diabetes education.  Please return in 3-4 months.

## 2020-09-01 NOTE — Progress Notes (Signed)
Patient ID: Vicki Rivera, female   DOB: 1975/12/25, 45 y.o.   MRN: [id number]  This visit occurred during the SARS-CoV-2 public health emergency.  Safety protocols were in place, including screening questions prior to the visit, additional usage of staff PPE, and extensive cleaning of exam room while observing appropriate contact time as indicated for disinfecting solutions.   HPI: Vicki Rivera is a 45 y.o.-year-old female, initially referred by her PCP, Dr.Smith, returning for follow-up for  DM2, dx in 2015, insulin-dependent, uncontrolled, without long term complications. Last appt 4 months ago.  Interim history: She was recently diagnosed with COVID-19 on 08/22/2020.  She was prescribed Paxlovid.  She felt much better after taking this. No increased urination, blurry vision, nausea, chest pain. She started to exercise on a bike  - not since Covid infection.  Reviewed HbA1c levels: Lab Results  Component Value Date   HGBA1C 10.7 (A) 04/28/2020   HGBA1C 9.6 (A) 01/19/2020   HGBA1C 10.0 (A) 06/10/2019  12/04/2016: HbA1c 10.1% 09/12/2015: HbA1c 10.6% 12/21/2014: 7.1%  Previously on: - Basaglar >> U200 Tresiba 30-34 units in a.m. and 40 units at night - Humalog 26-34 units before each meal, 3x a day   She tried Victoza >> N/V/AP and pancreatitis reportedly She tried Trulicity 1.24 mg weekly >> N/V She tried Invokana >> AP and severe nausea. 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, agricultural >> developed a facial rash (but unclear if from the Bai drinks she was drinking then)  She continues on U500 insulin: - 60 units at waking up/40-60 units >> 80 units before B - 100-110 units before L >> 100-120 units before L - 60-70 units before D >> 70-80 units if exercising in the evening; 100-110 units before D  She checks her sugars more than 4 times a day with her freestyle libre CGM:    Previously:   Lowest sugar was 86 >> 96 >>  110 >> 60s (middle of the night). she has  hypoglycemia awareness in the 150s Highest sugar was 347 >> 400 >> 400 >> HIs.  Glucometer: Freestyle >> [employer] >> AccuChek guide  Patient is Mostly plant-based + cheese. She is following an anti-inflammatory diet-LEAP.  -No CKD, last BUN/creatinine: 06/09/2020: Glucose 154, BUN/creatinine 12/0.66 Lab Results  Component Value Date   BUN 8 04/28/2020   CREATININE 0.80 04/28/2020  04/30/2019: Glu 226, BUN/Cr 10/0.69, GFR 92 04/24/2018: Glu 181, BUN/Cr 14/0.69, GFR 92   09/12/2015, from PCP: - CMP normal, except glucose 205. BUN/creatinine 6/0.64, EGFR 102. - ACR less than 30 01/18/2014: BUN/Cr 9/0.66, eGFR 99 On lisinopril.  -+ HL; last set of lipids: 06/09/2020: 151/199/41/77 Lab Results  Component Value Date   CHOL 140 04/28/2020   HDL 38.10 (L) 04/28/2020   LDLDIRECT 83.0 04/28/2020   TRIG 265.0 (H) 04/28/2020   CHOLHDL 4 04/28/2020  04/30/2019: 142/379/34/51 04/24/2018: 132/211/35/55 09/12/2015: 129/160/43/54 On Crestor 20.  - last eye exam was in 05/2020: No DR (Dr. Valetta Close)   -No numbness and tingling in her feet.  She also has HTN, PCOS, anemia -on iron.  ROS: Constitutional: no weight gain/no weight loss, no fatigue, no subjective hyperthermia, no subjective hypothermia Eyes: no blurry vision, no xerophthalmia ENT: no sore throat, no nodules palpated in neck, no dysphagia, no odynophagia, no hoarseness Cardiovascular: no CP/no SOB/no palpitations/no leg swelling Respiratory: no cough/no SOB/no wheezing Gastrointestinal: no N/no V/no D/no C/no acid reflux Musculoskeletal: no muscle aches/no joint aches Skin: no rashes, no hair loss Neurological: no  tremors/no numbness/no tingling/no dizziness  I reviewed pt's medications, allergies, PMH, social hx, family hx, and changes were documented in the history of present illness. Otherwise, unchanged from my initial visit note.  Past Medical History:  Diagnosis Date  . Allergy   . Anxiety   . Asthma   .  Diabetes mellitus without complication ([place])    DM Type II   . Hypertension   . Insomnia   . Morbid obesity ([place])   . PCOS (polycystic ovarian syndrome)   . Sinusitis    Past Surgical History:  Procedure Laterality Date  . CHOLECYSTECTOMY     Social History   Social History  . Marital Status: Single    Spouse Name: N/A  . Number of Children: 0   Social History Main Topics  . Smoking status: Never Smoker   . Smokeless tobacco: Not on file  . Alcohol Use: 0.0 oz/week    0 Standard drinks or equivalent per week  . Drug Use: Not on file   Social History Narrative   Single (female partner-8 yrs)   0 children   Exercise- 2 times weekly   Caffeine use - 3-4 servings daily   [employer]    Occup - [employer], [employer]   Current Outpatient Medications on File Prior to Visit  Medication Sig Dispense Refill  . albuterol (VENTOLIN HFA) 108 (90 Base) MCG/ACT inhaler Inhale 2 puffs into the lungs every 6 (six) hours as needed.     . BD PEN NEEDLE NANO 2ND GEN 32G X 4 MM MISC USE TO INJECT INSULIN 5 TIMES DAILY 500 each 1  . Blood Glucose Monitoring Suppl (ACCU-CHEK GUIDE) w/Device KIT 1 each by Does not apply route daily. 1 kit 0  . budesonide-formoterol (SYMBICORT) 160-4.5 MCG/ACT inhaler Inhale 2 puffs into the lungs 2 (two) times daily.    . Continuous Blood Gluc Receiver (FREESTYLE LIBRE 2 READER) DEVI 1 Device by Does not apply route See admin instructions. For continuous glucose monitoring 1 each 1  . Continuous Blood Gluc Sensor (FREESTYLE LIBRE 2 SENSOR) MISC APPLY AS DIRECTED AND CHANGE EVERY 14 DAYS 2 each 3  . desvenlafaxine (PRISTIQ) 100 MG 24 hr tablet Take 100 mg by mouth daily.     . fluticasone (FLONASE) 50 MCG/ACT nasal spray Place 2 sprays into both nostrils daily.     Marland Kitchen glucose blood (ACCU-CHEK GUIDE) test strip Use to check blood sugars 3 times daily 100 each 12  . HUMULIN R U-500  KWIKPEN 500 UNIT/ML kwikpen INJECT 40-85 UNITS BEFORE THE 3 MEALS AS ADVISED 18 mL 7  . levocetirizine (XYZAL) 5 MG tablet Take 5 mg by mouth every evening.     Marland Kitchen lisinopril-hydrochlorothiazide (PRINZIDE,ZESTORETIC) 10-12.5 MG tablet Take 1 tablet by mouth daily.     . montelukast (SINGULAIR) 5 MG chewable tablet Chew 10 mg by mouth at bedtime.     . nirmatrelvir/ritonavir EUA (PAXLOVID) TABS Take 3 tablets by mouth 2 (two) times daily. 30 tablet 0  . ONETOUCH DELICA LANCETS 54S MISC Use to test 3 times daily as instructed. 300 each 3  . rosuvastatin (CRESTOR) 20 MG tablet Take 20 mg by mouth daily.     Marland Kitchen sulfamethoxazole-trimethoprim (BACTRIM DS) 800-160 MG tablet Take 1 tablet by mouth 2 (two) times daily.     No current facility-administered medications on file prior to visit.   Allergies  Allergen Reactions  . Amoxicillin-Pot Clavulanate Hives  . Benzonatate  Other (See Comments)  . Canagliflozin Nausea Only  . Ceftin  [Cefuroxime] Hives  . Ciprofloxacin Hives  . Clarithromycin Other (See Comments)  . Emetrol Nausea Only  . Metformin Nausea Only  . Sulfa Antibiotics Hives   Family History  Problem Relation Age of Onset  . Hypertension Mother   . Diabetes Mother   . Hyperlipidemia Mother   . Parkinson's disease Mother   . Hyperlipidemia Father   . Diabetes Sister        TYPE I   . Diabetes Maternal Grandmother   . Dementia Maternal Grandfather   . Heart disease Paternal Grandfather        CAD; MI    PE: BP 138/88 (BP Location: Right Wrist, Patient Position: Sitting, Cuff Size: Normal)   Pulse (!) 101   Ht $R'5\' 4"'lu$  (1.626 m)   Wt (!) 421 lb (191 kg)   SpO2 97%   BMI 72.26 kg/m  Wt Readings from Last 3 Encounters:  09/01/20 (!) 421 lb (191 kg)  04/28/20 (!) 416 lb (188.7 kg)  01/19/20 (!) 401 lb (181.9 kg)   Constitutional: overweight, in NAD Eyes: PERRLA, EOMI, no exophthalmos ENT: moist mucous membranes, no thyromegaly, no cervical lymphadenopathy Cardiovascular:  tachycardia, RR, No MRG Respiratory: CTA B Gastrointestinal: abdomen soft, NT, ND, BS+ Musculoskeletal: no deformities, strength intact in all 4 Skin: moist, warm, no rashes Neurological: no tremor with outstretched hands, DTR normal in all 4  ASSESSMENT: 1. DM2, insulin-dependent, uncontrolled, without long term complications, but with hyperglycemia  2. HL  3. Obesity class III  PLAN:  1. Patient with uncontrolled type 2 diabetes, on concentrated insulin (U500), with still very poor control.  At last visit HbA1c was 10.7%, higher. -Of note, she was not able to tolerate metformin, GLP-1 receptor agonists, SGLT2 inhibitors. -At last visit, reviewing her CGM downloads, almost all of her blood sugars were above target, lower in the morning and then increasing around 9 AM even without drinking coffee or eating breakfast.  Sugars remained elevated throughout the day and they were even higher after dinner, while decreasing precipitously after 12 AM.  She was not bolusing for correction in the middle of the night.  Therefore, I suspected that the decrease in blood sugars overnight was due to excess insulin before dinner.  We reduced the dinnertime dose.  I also advised her to split her morning bolus into a bolus at waking up and to bolus before breakfast.  We also increased insulin with lunch to hopefully get her evening sugars under better control.  We discussed about an insulin pump and she wanted to look into an OmniPod pump and let me know if she wants to try it.  She thought about it and is open to start this.  She thinks that her insurance covers it. CGM interpretation: -At today's visit, we reviewed her CGM downloads: It appears that 15% of values are in target range (goal >70%), while 85% are higher than 180 (goal <25%), and 0% are lower than 70 (goal <4%).  The calculated average blood sugar is 263.  The projected HbA1c for the next 3 months (GMI) is 9.6%. -Reviewing the CGM trends, it appears  that sugars vary in a narrower range compared to last visit and her sugars in the morning are not increasing as much with drinking coffee.  She did start taking the U500 insulin before coffee, rather than later, before breakfast, but she also mentions that she is not eating breakfast anymore.  She  does have cookies with coffee.  She is taking a slightly higher dose for dinner than recommended at last visit, and she did have some lower blood sugars overnight especially when she was cycling in the evening.  At today's visit I advised her to stay with the lower doses of insulin when she is more active in the evening.  Otherwise, she can increase the dose up to 100 units.  In the morning and with lunch, however, since sugars are high throughout the day, I advised her to increase her U500 insulin doses by at least 20 units. -I also advised her to schedule an appointment with Leonia Reader for prepump training. - I suggested to:  Patient Instructions  Please increase: - 100 units at waking up - 130 -140 units before L - 70-100 units before D  Please schedule an appt with Leonia Reader for diabetes education.  Please return in 3-4 months.  - we checked her HbA1c: 9.6% (lower) - advised to check sugars at different times of the day - 4x a day, rotating check times - advised for yearly eye exams >> she is UTD - return to clinic in 3-4 months   2. HL -Regulated lipid panel from 04/2020: LDL at goal, triglycerides high but improved. -Continues Crestor 20 mg daily without side effects  3.Obesity class III -Unfortunately, we need to continue to use concentrated insulin for her due to high insulin resistance and this is conducive to weight gain -Before last visit she gained 15 pounds and since last visit she gained 5.   Philemon Kingdom, MD PhD [facility] Endocrinology

## 2020-09-21 ENCOUNTER — Encounter: Payer: Managed Care, Other (non HMO) | Attending: Internal Medicine | Admitting: Nutrition

## 2020-09-21 ENCOUNTER — Other Ambulatory Visit: Payer: Self-pay

## 2020-09-21 ENCOUNTER — Other Ambulatory Visit: Payer: Self-pay | Admitting: Internal Medicine

## 2020-09-21 DIAGNOSIS — E1165 Type 2 diabetes mellitus with hyperglycemia: Secondary | ICD-10-CM | POA: Insufficient documentation

## 2020-09-21 DIAGNOSIS — Z794 Long term (current) use of insulin: Secondary | ICD-10-CM

## 2020-09-22 ENCOUNTER — Other Ambulatory Visit: Payer: Self-pay | Admitting: Internal Medicine

## 2020-09-22 NOTE — Patient Instructions (Signed)
Have blood work drawn when blood sugar is below 225. Call when pump comes in for training.

## 2020-09-22 NOTE — Progress Notes (Signed)
Patient is here to discuss insulin pump therapy.  Her sister wears a pump.  She is wanting the OmniPod pump.  Handout given and we reviewed how this pump delivers insulin and what is needed to do pump therapy.  She is currently wearing a Libre sensor and feels that this is helping her.  Discussed the difference between sensor glucose and blood glucose.  She reported good understanding of this.  She wants this pump.  Paperwork was filled out by the patient and prescription put on Dr. Charlean Sanfilippo desk.  She is wanting the new pump, but explained that she needs to be a type I to get this pump.  She is willing to take a C-peptied and FBS to determine how much insulin she is producing  Note to Dr. Elvera Lennox to order blood work, and patient was instructed to have blood work done fasting when blood sugar is below 225.  She reported good understanding of this, and had no final questions.

## 2020-09-27 ENCOUNTER — Encounter: Payer: Self-pay | Admitting: Internal Medicine

## 2020-10-03 ENCOUNTER — Other Ambulatory Visit: Payer: Self-pay | Admitting: Internal Medicine

## 2020-12-12 ENCOUNTER — Other Ambulatory Visit: Payer: Self-pay

## 2020-12-12 ENCOUNTER — Encounter: Payer: Managed Care, Other (non HMO) | Attending: Internal Medicine | Admitting: Nutrition

## 2020-12-12 DIAGNOSIS — E119 Type 2 diabetes mellitus without complications: Secondary | ICD-10-CM | POA: Insufficient documentation

## 2020-12-12 NOTE — Progress Notes (Signed)
Patient was trained on the dash pump system. She is using U-500R insulin and took 90u at 5:30 this AM.   Per DR. Shamleffer's verbal order settings were put into the pdm by the patient:  Basal rate:  0.8u/hr, I/C: 1 (patient will be taking 6u for average size meal.  ISF: 16, Duration: 6 hours, target 150 (at patient's request), with correction over 150.   Patient filled a pod with 150u of U-500 R insulin and attached the pod to her left upper outer arm. Basal insulin started at 10AM.  She re demonstrated correctly how to bolus, putting in 6u for meals and allowing the pump to calculate a correction dose for her.  She reported good understanding of this.   We reviewed low blood sugar symptoms and treatments.  She will drink soda, or juice, 4-6 ounces   We also reviwed sick day guidelines, temp basal rates-when and how to use this, high blood sugar protocol, and how to start/stop pump. She started a glooko account and podder account ,and we linked her pump to our practice.  She was told to call here if blood sugars are dropping low, or remain high before 5PM today, and she agreed to do this. She signed the checklist as understanding all topics with no final questions.

## 2020-12-12 NOTE — Patient Instructions (Signed)
Call office if blood sugars drop low, or remain high today. Read over resource manual and owner's manual ASAP Call OmniPod help line if questions.

## 2020-12-13 ENCOUNTER — Telehealth: Payer: Self-pay | Admitting: Nutrition

## 2020-12-13 ENCOUNTER — Other Ambulatory Visit: Payer: Self-pay | Admitting: Internal Medicine

## 2020-12-13 MED ORDER — HUMULIN R U-500 (CONCENTRATED) 500 UNIT/ML ~~LOC~~ SOLN: SUBCUTANEOUS | 5 refills | Status: DC

## 2020-12-13 NOTE — Telephone Encounter (Signed)
Patient reports blood sugars much better today. FBS was 289, but acL was 169, and 2hr. pcL: 179.  She reported no difficulties using the pump, and no problems with pod today.  She was told that if FBSs are still high after a few more days, to call the office.  She agreed to do this.

## 2020-12-13 NOTE — Telephone Encounter (Signed)
Patient reported that she got a pod alert requiring her to change the pod.  Did not remember what it said, only that she needed to start a new pod.  Said she used her Pen for lunch insulin, and that when she got home, she changed the pod,without difficulty, and took a bolus just a few minutes ago for her supper meal.  She was told that if blood sugar is below 120 at HS-which will be 4hr. pcS tonight, that she should eat a piece of fruit without giving additional insulin.  She reported good understanding of this and had no final questions for me.

## 2020-12-22 ENCOUNTER — Telehealth: Payer: Self-pay | Admitting: Nutrition

## 2020-12-22 NOTE — Telephone Encounter (Signed)
Patient reports that she is using the pump without difficulty, but that blood sugars are going high after supper and dropping low 2 hours after lunch. Suggested she reduce the lunch time bolus from 6u to 5 units, and increase the the supper bolus by 1 unit to see if this will correct both lows and highs.   She is also complaining that her pod leaks insuin when she wears it on her left arm.  She sleeps on her left side, and it was suggested that she wear an arm band around the pump when she sleeps and her pump is on her left side.  She agreed to do this and had no final questions.

## 2021-01-02 ENCOUNTER — Encounter: Payer: Self-pay | Admitting: Internal Medicine

## 2021-01-04 ENCOUNTER — Telehealth: Payer: Self-pay | Admitting: Nutrition

## 2021-01-04 ENCOUNTER — Other Ambulatory Visit: Payer: Self-pay | Admitting: Internal Medicine

## 2021-01-04 MED ORDER — OMNIPOD DASH PODS (GEN 4) MISC: 3 refills | Status: DC

## 2021-01-04 NOTE — Telephone Encounter (Signed)
Patient reports that she is filling her pod with 400u of U-500 R insulin.  I explained that the pod holds 1000u, and she will need to put this into the pod with each fill, and this should last her for 3 days.  She agreed to do this and had no final questions

## 2021-01-10 ENCOUNTER — Encounter: Payer: Self-pay | Admitting: Internal Medicine

## 2021-01-10 ENCOUNTER — Encounter: Payer: Managed Care, Other (non HMO) | Admitting: Nutrition

## 2021-01-10 ENCOUNTER — Ambulatory Visit (INDEPENDENT_AMBULATORY_CARE_PROVIDER_SITE_OTHER): Payer: Managed Care, Other (non HMO) | Admitting: Internal Medicine

## 2021-01-10 ENCOUNTER — Other Ambulatory Visit: Payer: Self-pay

## 2021-01-10 VITALS — BP 134/76 | HR 91 | Ht 64.0 in | Wt >= 6400 oz

## 2021-01-10 DIAGNOSIS — E785 Hyperlipidemia, unspecified: Secondary | ICD-10-CM | POA: Diagnosis not present

## 2021-01-10 DIAGNOSIS — Z6841 Body Mass Index (BMI) 40.0 and over, adult: Secondary | ICD-10-CM

## 2021-01-10 DIAGNOSIS — Z794 Long term (current) use of insulin: Secondary | ICD-10-CM | POA: Diagnosis not present

## 2021-01-10 DIAGNOSIS — E1165 Type 2 diabetes mellitus with hyperglycemia: Secondary | ICD-10-CM | POA: Diagnosis not present

## 2021-01-10 LAB — POCT GLYCOSYLATED HEMOGLOBIN (HGB A1C): Hemoglobin A1C: 9.1 % — AB (ref 4.0–5.6)

## 2021-01-10 NOTE — Progress Notes (Signed)
Patient ID: Vicki Rivera, female   DOB: 1975-04-24, 45 y.o.   MRN: [id number]  This visit occurred during the SARS-CoV-2 public health emergency.  Safety protocols were in place, including screening questions prior to the visit, additional usage of staff PPE, and extensive cleaning of exam room while observing appropriate contact time as indicated for disinfecting solutions.   HPI: Vicki Rivera is a 45 y.o.-year-old female, initially referred by her PCP, Dr.Smith, returning for follow-up for  DM2, dx in 2015, insulin-dependent, uncontrolled, without long term complications. Last appt 4 months ago.  Interim history: No increased urination, blurry vision, nausea, chest pain. She started Abilify. She will change from Pristiq to Trintellix due to lack of effect. Since last visit, she was able to start an insulin pump and she loves it.  Reviewed HbA1c levels: Lab Results  Component Value Date   HGBA1C 9.6 (A) 09/01/2020   HGBA1C 10.7 (A) 04/28/2020   HGBA1C 9.6 (A) 01/19/2020  12/04/2016: HbA1c 10.1% 09/12/2015: HbA1c 10.6% 12/21/2014: 7.1%  Previously on: - Basaglar >> U200 Tresiba 30-34 units in a.m. and 40 units at night - Humalog 26-34 units before each meal, 3x a day  She tried Victoza >> N/V/AP and pancreatitis reportedly She tried Trulicity 5.63 mg weekly >> N/V She tried Invokana >> AP and severe nausea. 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, agricultural >> developed a facial rash (but unclear if from the Bai drinks she was drinking then)  At last visit she was on U500 insulin: - 60 units at waking up/40-60 units >> 80 units before B - 100-110 units before L >> 100-120 units before L - 60-70 units before D >> 70-80 units if exercising in the evening; 100-110 units before D  Insulin pump: - OmniPod Dash >> but she has the OmniPod 5 at home and would like to start on it eventually.  Insulin: - U500  CGM: -Freestyle libre 2 (Apparently the Dexcom CGM is also  covered by her insurance)   Supplies: - [facility] for both  Pump settings: - basal rates: 12 am: 0.8 units/h - ICR: 1:1 - target: 150 - ISF: 16 - Insulin on Board: 6h - bolus wizard: on - changes infusion site: q3 days  TDD from basal insulin: 47% TDD from bolus insulin: 53% TDD: 40.5 units x 5 = ~200 units of U100 insulin  She checks her sugars more than 4 times a day with her CGM:   Previously:   Previously:   Lowest sugar was 86 >> 96 >>  110 >> 60s (middle of the night)>> 112. she has hypoglycemia awareness in the 150s Highest sugar was 347 >> 400 >> 400 >> HIs >> 360.  Glucometer: Freestyle >> [employer] >> AccuChek guide  Diet: Mostly plant-based + cheese. She is following an anti-inflammatory diet-LEAP.  -No CKD, last BUN/creatinine: 06/09/2020: Glucose 154, BUN/creatinine 12/0.66 Lab Results  Component Value Date   BUN 8 04/28/2020   CREATININE 0.80 04/28/2020  04/30/2019: Glu 226, BUN/Cr 10/0.69, GFR 92 04/24/2018: Glu 181, BUN/Cr 14/0.69, GFR 92   09/12/2015, from PCP: - CMP normal, except glucose 205. BUN/creatinine 6/0.64, EGFR 102. - ACR less than 30 01/18/2014: BUN/Cr 9/0.66, eGFR 99 On lisinopril.  -+ HL; last set of lipids: 06/09/2020: 151/199/41/77 Lab Results  Component Value Date   CHOL 140 04/28/2020   HDL 38.10 (L) 04/28/2020   LDLDIRECT 83.0 04/28/2020   TRIG 265.0 (H) 04/28/2020   CHOLHDL 4 04/28/2020  04/30/2019: 142/379/34/51 04/24/2018: 132/211/35/55 09/12/2015:  129/160/43/54 On Crestor 20.  - last eye exam was in 05/2020: No DR (Dr. Valetta Close)   -No numbness and tingling in her feet.  She also has HTN, PCOS, anemia -on iron.  ROS: + See HPI  I reviewed pt's medications, allergies, PMH, social hx, family hx, and changes were documented in the history of present illness. Otherwise, unchanged from my initial visit note.  Past Medical History:  Diagnosis Date   Allergy    Anxiety    Asthma    Diabetes mellitus without  complication ([place])    DM Type II    Hypertension    Insomnia    Morbid obesity (HCC)    PCOS (polycystic ovarian syndrome)    Sinusitis    Past Surgical History:  Procedure Laterality Date   CHOLECYSTECTOMY     Social History   Social History   Marital Status: Single    Spouse Name: N/A   Number of Children: 0   Social History Main Topics   Smoking status: Never Smoker    Smokeless tobacco: Not on file   Alcohol Use: 0.0 oz/week    0 Standard drinks or equivalent per week   Drug Use: Not on file   Social History Narrative   Single (female partner-8 yrs)   0 children   Exercise- 2 times weekly   Caffeine use - 3-4 servings daily   [employer]    Occup - [employer], [employer]   Current Outpatient Medications on File Prior to Visit  Medication Sig Dispense Refill   Insulin Disposable Pump (OMNIPOD DASH PODS, GEN 4,) MISC Use 1 every day 90 each 3   albuterol (VENTOLIN HFA) 108 (90 Base) MCG/ACT inhaler Inhale 2 puffs into the lungs every 6 (six) hours as needed.      BD PEN NEEDLE NANO 2ND GEN 32G X 4 MM MISC USE TO INJECT INSULIN 5 TIMES DAILY 500 each 1   Blood Glucose Monitoring Suppl (ACCU-CHEK GUIDE) w/Device KIT 1 each by Does not apply route daily. 1 kit 0   budesonide-formoterol (SYMBICORT) 160-4.5 MCG/ACT inhaler Inhale 2 puffs into the lungs 2 (two) times daily.     Continuous Blood Gluc Receiver (FREESTYLE LIBRE 2 READER) DEVI 1 Device by Does not apply route See admin instructions. For continuous glucose monitoring 1 each 1   Continuous Blood Gluc Sensor (FREESTYLE LIBRE 2 SENSOR) MISC APPLY AS DIRECTED AND CHANGE EVERY 14 DAYS 2 each 3   desvenlafaxine (PRISTIQ) 100 MG 24 hr tablet Take 100 mg by mouth daily.      fluticasone (FLONASE) 50 MCG/ACT nasal spray Place 2 sprays into both nostrils daily.      glucose blood (ACCU-CHEK GUIDE) test strip Use to check blood sugars 3 times  daily 100 each 12   insulin regular human CONCENTRATED (HUMULIN R) 500 UNIT/ML injection Use up to 250 units of insulin in the pump daily 40 mL 5   levocetirizine (XYZAL) 5 MG tablet Take 5 mg by mouth every evening.      lisinopril-hydrochlorothiazide (PRINZIDE,ZESTORETIC) 10-12.5 MG tablet Take 1 tablet by mouth daily.      montelukast (SINGULAIR) 5 MG chewable tablet Chew 10 mg by mouth at bedtime.      ONETOUCH DELICA LANCETS 45Y MISC Use to test 3 times daily as instructed. 300 each 3   rosuvastatin (CRESTOR) 20 MG tablet Take 20 mg by mouth daily.      No current facility-administered  medications on file prior to visit.   Allergies  Allergen Reactions   Amoxicillin-Pot Clavulanate Hives   Benzonatate Other (See Comments)   Canagliflozin Nausea Only   Ceftin  [Cefuroxime] Hives   Ciprofloxacin Hives   Clarithromycin Other (See Comments)   Emetrol Nausea Only   Metformin Nausea Only   Sulfa Antibiotics Hives   Family History  Problem Relation Age of Onset   Hypertension Mother    Diabetes Mother    Hyperlipidemia Mother    Parkinson's disease Mother    Hyperlipidemia Father    Diabetes Sister        TYPE I    Diabetes Maternal Grandmother    Dementia Maternal Grandfather    Heart disease Paternal Grandfather        CAD; MI    PE: BP 134/76 (BP Location: Right Arm, Patient Position: Sitting, Cuff Size: Large)   Pulse 91   Ht 5' 4"  (1.626 m)   Wt (!) 420 lb 6.4 oz (190.7 kg)   SpO2 98%   BMI 72.16 kg/m  Wt Readings from Last 3 Encounters:  01/10/21 (!) 420 lb 6.4 oz (190.7 kg)  09/01/20 (!) 421 lb (191 kg)  04/28/20 (!) 416 lb (188.7 kg)   Constitutional: obese, in NAD Eyes: PERRLA, EOMI, no exophthalmos ENT: moist mucous membranes, no thyromegaly, no cervical lymphadenopathy Cardiovascular: tachycardia, RR, No MRG Respiratory: CTA B Gastrointestinal: abdomen soft, NT, ND, BS+ Musculoskeletal: no deformities, strength intact in all 4 Skin: moist, warm, no  rashes Neurological: no tremor with outstretched hands, DTR normal in all 4  ASSESSMENT: 1. DM2, insulin-dependent, uncontrolled, without long term complications, but with hyperglycemia  2. HL  3. Obesity class III  PLAN:  1. Patient with uncontrolled type 2 diabetes, very insulin resistant, on concentrated insulin (U500), now on insulin pump (OmniPod), started since last visit.  I suggested this as sugars were increasing in the morning, without drinking coffee or eating breakfast and we were limited in increasing her U500 insulin doses due to potential lower blood sugars after 12 AM. -Of note, she was not able to tolerate metformin, GLP-1 receptor agonists, SGLT2 inhibitors. -At last visit, HbA1c was better, but still very high, at 9.6%. -Sugars started to improve after starting the insulin pump.  She also has a freestyle libre 2 CGM. CGM interpretation: -At today's visit, we reviewed her CGM downloads: It appears that 36% of values are in target range (goal >70%), while [place] 64% are higher than 180 (goal <25%), and 0% are lower than 70 (goal <4%).  The calculated average blood sugar is 203.  The projected HbA1c for the next 3 months ([facility]MI) is 8.2%. -Reviewing the CGM trends, it apperars that the sugars did improve since last visit, after starting insulin pump.  They are best overnight and then they increase during the day, with a slightly more significant increase after meals, but especially after dinner. Since most sugars are higher than goal >> will increase her basal rates at today's visit.  If the sugars in the morning continue to start increasing even before she drinks coffee or eats, we may need to give her a higher basal rate after 6 AM.  -As of now, she does not count carbs but is basing her boluses on the size of the meal.  She has a 1: 1 insulin to carb ratio.  We did discuss that as she is getting more comfortable with the pump, I would prefer that she uses a regular ICR, maybe  1:  15-1: 20.  She has an appointment with the diabetes educator and will need carb counting training.  She agrees with this plan.  For now, I advised her to try to enter a higher amount of carbs for the first meal of the day if she eats larger meals and to use 6 g mostly for dinner (currently entering 4 g). - as of now, she is occasionally staggering the boluses, doing completion boluses after the meal and I advised her that ideally she would bolus for the entire meal, 30 minutes in advance. - I suggested to:  Patient Instructions  Please use the following pump settings:  basal rates: 12 am: 0.8 >> 0.9 units/h - ICR: 1:1  - target: 150 - ISF: 16 - Insulin on Board: 6h  Please try to enter 4-6g carbs per meal - mostly 6g for dinner. Bolus 30 min before the meal.  After you learn carb counting, then try to change to an ICR 1:15-20.  Please return in 3 months.  - we checked her HbA1c: 9.1% (lower) - advised to check sugars at different times of the day - 4x a day, rotating check times - advised for yearly eye exams >> she is UTD - return to clinic in 3-4 months   2. HL -Reviewed her latest lipid panel from 04/2020: LDL at goal, triglycerides high, but improved -Continues Crestor 20 mg daily without side effects  3.Obesity class III -Unfortunately, we need to continue to use concentrated insulin for her due to high insulin resistance and this is conducive to weight gain -She gained 20 pounds before the last 2 visits -At today's visit, she lost 1 pound.  Philemon Kingdom, MD PhD [facility]

## 2021-01-10 NOTE — Patient Instructions (Signed)
Please use the following pump settings:  basal rates: 12 am: 0.8 >> 0.9 units/h - ICR: 1:1  - target: 150 - ISF: 16 - Insulin on Board: 6h  Please try to enter 4-6g carbs per meal - mostly 6g for dinner. Bolus 30 min before the meal.  After you learn carb counting, then try to change to an ICR 1:15-20.  Please return in 3 months.

## 2021-01-13 ENCOUNTER — Other Ambulatory Visit: Payer: Self-pay

## 2021-01-13 ENCOUNTER — Encounter: Payer: Self-pay | Admitting: Internal Medicine

## 2021-01-13 MED ORDER — FREESTYLE LIBRE 2 SENSOR MISC: 3 refills | Status: DC

## 2021-01-13 MED ORDER — FREESTYLE LIBRE 2 READER DEVI: 1.0000 | 1 refills | Status: DC

## 2021-01-16 ENCOUNTER — Encounter: Payer: Managed Care, Other (non HMO) | Admitting: Nutrition

## 2021-01-23 ENCOUNTER — Encounter: Payer: Managed Care, Other (non HMO) | Attending: Internal Medicine | Admitting: Nutrition

## 2021-01-23 ENCOUNTER — Other Ambulatory Visit: Payer: Self-pay | Admitting: Internal Medicine

## 2021-01-23 ENCOUNTER — Other Ambulatory Visit: Payer: Self-pay

## 2021-01-23 DIAGNOSIS — Z794 Long term (current) use of insulin: Secondary | ICD-10-CM | POA: Insufficient documentation

## 2021-01-23 DIAGNOSIS — E1165 Type 2 diabetes mellitus with hyperglycemia: Secondary | ICD-10-CM | POA: Insufficient documentation

## 2021-01-23 NOTE — Patient Instructions (Signed)
Bolus 10-15 minutes before the meal

## 2021-01-23 NOTE — Progress Notes (Signed)
Patient reports no difficulties with her pump except that the prescription for the pods is exactly what she needs for the month, but does not allow for times times it has fallen off-which can be up to 3X/month.  She was given one pod.  Also says PDM is not holding charge for more than a day. She was told to call the 800 number for a new PDM, and that I can help if she needs help putting in the data.  She said she could "figure it out herseft" Josephine Igo view download shows only 50% of readings in target range.  Patient says she has been sick for last 7 days.  Discussed need for temp basal rate of 20% more during these times.  She was shone again how to do this.  Says only last 3 days her readings have come down.  Today time in target was 100%, with FBS at 122, but has not eaten.  Blood sugars rise after lunch to 280, but then drop to 90=110 usually before 6PM.   Supper readings also high 2hr. Pc into the 280s but eventually come down into target by morning.  Suggested she try to bolus 15 minutes before eating, and she agreed to do this. Also says Dr. Elvera Lennox wants her to start carb counting, but she says she is not ready for this.  She was already instructed on this.  We reviewed this some, and she was given a handout on reading labels, and 15 gram portions of carbs.  We discussed the need to change the carb ratio before beginning this, and she agreed to call before starting this.   She had no final questions.

## 2021-02-01 ENCOUNTER — Other Ambulatory Visit: Payer: Self-pay | Admitting: Family Medicine

## 2021-02-01 DIAGNOSIS — M546 Pain in thoracic spine: Secondary | ICD-10-CM

## 2021-02-08 ENCOUNTER — Other Ambulatory Visit: Payer: Managed Care, Other (non HMO)

## 2021-02-11 ENCOUNTER — Other Ambulatory Visit: Payer: Self-pay | Admitting: Internal Medicine

## 2021-03-04 ENCOUNTER — Other Ambulatory Visit: Payer: Self-pay | Admitting: Internal Medicine

## 2021-03-06 ENCOUNTER — Other Ambulatory Visit: Payer: Self-pay | Admitting: Internal Medicine

## 2021-03-07 ENCOUNTER — Other Ambulatory Visit (HOSPITAL_COMMUNITY): Payer: Self-pay

## 2021-03-07 ENCOUNTER — Telehealth: Payer: Self-pay | Admitting: Pharmacy Technician

## 2021-03-07 NOTE — Telephone Encounter (Signed)
Patient Advocate Encounter   Received notification from [employer] that prior authorization for OMNIPOD DASH PODS (GEN 4) is due for renewal.   PA submitted on 12.6.22 Key [id number] Status is pending    [facility] will continue to follow:   Sherilyn Dacosta, CPhT Patient Advocate [facility] Phone: [phone] Fax:  [phone]

## 2021-03-16 ENCOUNTER — Encounter: Payer: Self-pay | Admitting: Internal Medicine

## 2021-03-17 ENCOUNTER — Other Ambulatory Visit: Payer: Self-pay | Admitting: Internal Medicine

## 2021-03-17 MED ORDER — HUMULIN R U-500 KWIKPEN 500 UNIT/ML ~~LOC~~ SOPN: PEN_INJECTOR | SUBCUTANEOUS | 3 refills | Status: DC

## 2021-03-28 ENCOUNTER — Other Ambulatory Visit: Payer: Self-pay | Admitting: Internal Medicine

## 2021-03-28 ENCOUNTER — Encounter: Payer: Self-pay | Admitting: Internal Medicine

## 2021-03-28 ENCOUNTER — Other Ambulatory Visit: Payer: Self-pay

## 2021-03-28 ENCOUNTER — Ambulatory Visit (INDEPENDENT_AMBULATORY_CARE_PROVIDER_SITE_OTHER): Payer: Managed Care, Other (non HMO) | Admitting: Internal Medicine

## 2021-03-28 ENCOUNTER — Telehealth: Payer: Self-pay | Admitting: Pharmacy Technician

## 2021-03-28 VITALS — BP 130/82 | HR 92 | Ht 64.0 in | Wt >= 6400 oz

## 2021-03-28 DIAGNOSIS — E1165 Type 2 diabetes mellitus with hyperglycemia: Secondary | ICD-10-CM

## 2021-03-28 DIAGNOSIS — Z794 Long term (current) use of insulin: Secondary | ICD-10-CM

## 2021-03-28 DIAGNOSIS — Z6841 Body Mass Index (BMI) 40.0 and over, adult: Secondary | ICD-10-CM

## 2021-03-28 DIAGNOSIS — E785 Hyperlipidemia, unspecified: Secondary | ICD-10-CM

## 2021-03-28 LAB — POCT GLYCOSYLATED HEMOGLOBIN (HGB A1C): Hemoglobin A1C: 8.7 % — AB (ref 4.0–5.6)

## 2021-03-28 MED ORDER — PEN NEEDLES 31G X 8 MM MISC: 3 refills | Status: DC

## 2021-03-28 MED ORDER — DEXCOM G6 TRANSMITTER MISC: 1.0000 | 3 refills | Status: DC

## 2021-03-28 MED ORDER — DEXCOM G6 RECEIVER DEVI: 1.0000 | Freq: Once | 0 refills | Status: AC

## 2021-03-28 MED ORDER — DEXCOM G6 SENSOR MISC: 1.0000 | 3 refills | Status: AC

## 2021-03-28 NOTE — Progress Notes (Signed)
Patient ID: Vicki Rivera, female   DOB: 1975/12/12, 45 y.o.   MRN: [id number]  This visit occurred during the SARS-CoV-2 public health emergency.  Safety protocols were in place, including screening questions prior to the visit, additional usage of staff PPE, and extensive cleaning of exam room while observing appropriate contact time as indicated for disinfecting solutions.   HPI: Vicki Rivera is a 45 y.o.-year-old female, initially referred by her PCP, Dr.Smith, returning for follow-up for  DM2, dx in 2015, insulin-dependent, uncontrolled, without long term complications. Last appt 2.5 months ago.  Interim history: No increased urination, blurry vision, nausea, chest pain. + some exertional SOB. Before last visit, she was able to start an insulin pump and she is happy with it.  Reviewed HbA1c levels: Lab Results  Component Value Date   HGBA1C 9.1 (A) 01/10/2021   HGBA1C 9.6 (A) 09/01/2020   HGBA1C 10.7 (A) 04/28/2020  12/04/2016: HbA1c 10.1% 09/12/2015: HbA1c 10.6% 12/21/2014: 7.1%  Previously on: - Basaglar >> U200 Tresiba 30-34 units in a.m. and 40 units at night - Humalog 26-34 units before each meal, 3x a day  She tried Victoza >> N/V/AP and pancreatitis reportedly She tried Trulicity 8.88 mg weekly >> N/V She tried Invokana >> AP and severe nausea. 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, agricultural >> developed a facial rash (but unclear if from the Bai drinks she was drinking then)  At last visit she was on U500 insulin: - 60 units at waking up/40-60 units >> 80 units before B - 100-110 units before L >> 100-120 units before L - 60-70 units before D >> 70-80 units if exercising in the evening; 100-110 units before D  Insulin pump: - OmniPod Dash >> but she has the OmniPod 5 at home and would like to start it  Insulin: - U500  CGM: -Freestyle libre 2  Supplies[facility]  Pump settings:  basal rates: 12 am: 0.8 >> 0.9 units/h - ICR: 1:1  -  target: 150 - ISF: 16 - Insulin on Board: 6h - bolus wizard: on - changes infusion site: q3 days  TDD from basal insulin: 47% >> 54% TDD from bolus insulin: 53% >> 46% TDD: 40 units x 5 = ~200 units of U100 insulin  She checks her sugars more than 4 times a day with her CGM:   Previously:   Previously:   Lowest sugar was 60s (middle of the night)>> 112 >> 69. she has hypoglycemia awareness in the 150s Highest sugar was HI >> 360 >> 361.  Glucometer: Freestyle >> [employer] >> AccuChek guide  Diet: Mostly plant-based + cheese. She is following an anti-inflammatory diet-LEAP.  -No CKD, last BUN/creatinine: 06/09/2020: Glucose 154, BUN/creatinine 12/0.66 Lab Results  Component Value Date   BUN 8 04/28/2020   CREATININE 0.80 04/28/2020  04/30/2019: Glu 226, BUN/Cr 10/0.69, GFR 92 04/24/2018: Glu 181, BUN/Cr 14/0.69, GFR 92   09/12/2015, from PCP: - CMP normal, except glucose 205. BUN/creatinine 6/0.64, EGFR 102. - ACR less than 30 01/18/2014: BUN/Cr 9/0.66, eGFR 99 On lisinopril.  -+ HL; last set of lipids: 06/09/2020: 151/199/41/77 Lab Results  Component Value Date   CHOL 140 04/28/2020   HDL 38.10 (L) 04/28/2020   LDLDIRECT 83.0 04/28/2020   TRIG 265.0 (H) 04/28/2020   CHOLHDL 4 04/28/2020  04/30/2019: 142/379/34/51 04/24/2018: 132/211/35/55 09/12/2015: 129/160/43/54 On Crestor 20.  - last eye exam was in 05/2020: No DR (Dr. Valetta Close)   -No numbness and tingling in her  feet.  She also has HTN, PCOS, anemia -on iron.  ROS: + See HPI  I reviewed pt's medications, allergies, PMH, social hx, family hx, and changes were documented in the history of present illness. Otherwise, unchanged from my initial visit note.  Past Medical History:  Diagnosis Date   Allergy    Anxiety    Asthma    Diabetes mellitus without complication ([place])    DM Type II    Hypertension    Insomnia    Morbid obesity (HCC)    PCOS (polycystic ovarian syndrome)    Sinusitis     Past Surgical History:  Procedure Laterality Date   CHOLECYSTECTOMY     Social History   Social History   Marital Status: Single    Spouse Name: N/A   Number of Children: 0   Social History Main Topics   Smoking status: Never Smoker    Smokeless tobacco: Not on file   Alcohol Use: 0.0 oz/week    0 Standard drinks or equivalent per week   Drug Use: Not on file   Social History Narrative   Single (female partner-8 yrs)   0 children   Exercise- 2 times weekly   Caffeine use - 3-4 servings daily   [employer]    Occup - [employer], [employer]   Current Outpatient Medications on File Prior to Visit  Medication Sig Dispense Refill   albuterol (VENTOLIN HFA) 108 (90 Base) MCG/ACT inhaler Inhale 2 puffs into the lungs every 6 (six) hours as needed.      BD PEN NEEDLE NANO 2ND GEN 32G X 4 MM MISC USE TO INJECT INSULIN 5 TIMES DAILY 500 each 1   Blood Glucose Monitoring Suppl (ACCU-CHEK GUIDE) w/Device KIT 1 each by Does not apply route daily. 1 kit 0   budesonide-formoterol (SYMBICORT) 160-4.5 MCG/ACT inhaler Inhale 2 puffs into the lungs 2 (two) times daily.     Continuous Blood Gluc Receiver (FREESTYLE LIBRE 2 READER) DEVI 1 Device by Does not apply route See admin instructions. For continuous glucose monitoring 1 each 1   Continuous Blood Gluc Sensor (FREESTYLE LIBRE 2 SENSOR) MISC APPLY AS DIRECTED AND CHANGE EVERY 14 DAYS 2 each 3   desvenlafaxine (PRISTIQ) 100 MG 24 hr tablet Take 100 mg by mouth daily.      fluticasone (FLONASE) 50 MCG/ACT nasal spray Place 2 sprays into both nostrils daily.      glucose blood (ACCU-CHEK GUIDE) test strip Use to check blood sugars 3 times daily 100 each 12   Insulin Disposable Pump (OMNIPOD DASH PODS, GEN 4,) MISC Use 1 every day 90 each 3   insulin regular human CONCENTRATED (HUMULIN R U-500 KWIKPEN) 500 UNIT/ML KwikPen Use up to 250 units a day under skin as  advised 90 mL 3   levocetirizine (XYZAL) 5 MG tablet Take 5 mg by mouth every evening.      lisinopril-hydrochlorothiazide (PRINZIDE,ZESTORETIC) 10-12.5 MG tablet Take 1 tablet by mouth daily.      montelukast (SINGULAIR) 5 MG chewable tablet Chew 10 mg by mouth at bedtime.      ONETOUCH DELICA LANCETS 16X MISC Use to test 3 times daily as instructed. 300 each 3   rosuvastatin (CRESTOR) 20 MG tablet Take 20 mg by mouth daily.      No current facility-administered medications on file prior to visit.   Allergies  Allergen Reactions   Amoxicillin-Pot Clavulanate Hives   Benzonatate Other (See  Comments)   Canagliflozin Nausea Only   Ceftin  [Cefuroxime] Hives   Ciprofloxacin Hives   Clarithromycin Other (See Comments)   Emetrol Nausea Only   Metformin Nausea Only   Sulfa Antibiotics Hives   Family History  Problem Relation Age of Onset   Hypertension Mother    Diabetes Mother    Hyperlipidemia Mother    Parkinson's disease Mother    Hyperlipidemia Father    Diabetes Sister        TYPE I    Diabetes Maternal Grandmother    Dementia Maternal Grandfather    Heart disease Paternal Grandfather        CAD; MI   PE: BP 130/82 (BP Location: Right Wrist, Patient Position: Sitting, Cuff Size: Normal)    Pulse 92    Ht 5' 4"  (1.626 m)    Wt (!) 414 lb 6.4 oz (188 kg)    SpO2 97%    BMI 71.13 kg/m  Wt Readings from Last 3 Encounters:  03/28/21 (!) 414 lb 6.4 oz (188 kg)  01/10/21 (!) 420 lb 6.4 oz (190.7 kg)  09/01/20 (!) 421 lb (191 kg)   Constitutional: obese, in NAD Eyes: PERRLA, EOMI, no exophthalmos ENT: moist mucous membranes, no thyromegaly, no cervical lymphadenopathy Cardiovascular: Tachycardia, RR, No MRG, + mild periankle edema bilaterally Respiratory: CTA B Musculoskeletal: no deformities, strength intact in all 4 Skin: moist, warm, no rashes Neurological: no tremor with outstretched hands, DTR normal in all 4  ASSESSMENT: 1. DM2, insulin-dependent, uncontrolled,  without long term complications, but with hyperglycemia  2. HL  3. Obesity class III  PLAN:  1. Patient with uncontrolled type 2 diabetes, very insulin resistant, on concentrated insulin (U500), currently also on an insulin pump (OmniPod) started before last visit.  Of note, she was not able to tolerate metformin, GLP-1 receptor agonists, SGLT2 inhibitors.  At last visit, after starting the pump, HbA1c slightly decreased to 9.1%.  At that time, reviewing the CGM trends, sugars were better, best overnight and then increasing during the day, with a slightly more significant increase after meals, but especially after dinner.  We increased her basal rates at that time and we discussed about increasing morning basal rates even more if sugars remain higher in the morning.  At that time, she was not counting her carbs but Let's basing her boluses on the size of her meals.  She had a 1:1 insulin to carb ratio.  I did suggest a referral to nutrition to learn carb counting and then switch to an ICR of 1:15-1:20.  Until then, I advised her to take 4 to 6 units of (U500) insulin -equivalent of 20-30 units of U1 100 per meal.  She saw the nutritionist but decided to continue meal estimation rather than switching insulin to carb ratio.  At last visit we also discussed about trying not to stagger the boluses or doing boluses after meals but enter the entire bolus 30 minutes before the meal. CGM interpretation: -At today's visit, we reviewed her CGM downloads: It appears that 34% of values are in target range (goal >70%), while 66% are higher than 180 (goal <25%), and 0% are lower than 70 (goal <4%).  The calculated average blood sugar is 203.  The projected HbA1c for the next 3 months (GMI) is 8.2%. -Reviewing the CGM trends, sugars appear to be slightly better in the morning and midday but they are still high during the night and after dinner.  After breakfast and lunch sugars are  variable, with occasional spikes but  also blood sugars at goal.   -Upon reviewing her pump downloads, it appears that she is bolusing for her meals, but she is bolusing a fixed dose of 6 units (30 units of U100 insulin), which is a rigid regimen and does not protect her from blood sugars fluctuations.  We discussed about the importance of dose based on the size and consistency of her meals.  Also, she mentions that she is not waiting 30 minutes between the insulin bolus and the meal, but mostly 10 to 20 minutes.  I believe that this is the reason why her sugars are dropping especially after dinner.  She also had a lower blood sugar, at 69 after breakfast,, possibly due to delayed insulin injection. -At this visit, since sugars are still mostly above the upper limit of the target interval range, I advised her to increase the basal rates further.  I also advised her that if she sees that the blood sugars do not improve, she can increase the basal rate by herself.  We also lowered the daytime correction target for the same reason. Regarding the meals, I advised her to enter 6 to 8 g of carbs per meal but 8-10 g of carbs for dinner. -She has the OmniPod 5 insulin pump at home.  She is interested in starting the closed-loop system with a Dexcom CGM.  I sent a prescription for the Dexcom CGM to her pharmacy.  She will schedule an appointment with the diabetes educator after she obtains it.  We discussed about the fact that in the closed-loop system, the basal rates,  and the rest of the parameters with the exception of the ICRs will be determined by the system.  Discussed about the timeline in which the new system will optimize the pump settings.  -For now, we will switch to the G6 Dexcom system since the [place] system is not available yet, even though it was approved by FDA. - I suggested to:  Patient Instructions  Please use the following pump settings:  basal rates: 12 am: 0.9 >> 1.0 units/h (may need to increase to 1.1 units/h after 4 days if  needed) - ICR: 1:1  - target:  6 am: 150 >> 130 9 pm: 150 - ISF: 16 - Insulin on Board: 6h  Please try to enter 6-8g carbs per meal - 8-10g for dinner. Bolus 30-45 min before the meal.  If you do carb counting, then try to use an insulin to carb ratio of 1:15-20.  [place].: [phone].  Please return in 3 months.  - we checked her HbA1c: 8.7% (improved) - advised to check sugars at different times of the day - 4x a day, rotating check times - advised for yearly eye exams >> she is UTD - return to clinic in 3 months   2. HL -Reviewed latest lipid panel from 05/2020: LDL at goal, triglycerides high but improved -Continues Crestor 20 mg daily without side effects  3.Obesity class III -Unfortunately, we need to continue concentrated insulin use due to her high insulin resistance. -She lost 6 pounds since last visit  Total time spent for the visit: 40 min, in obtaining information from the patient and from the chart, charting, reviewing her pump downloads, discussing her hypo- and hyper-glycemic episodes, reviewing previous labs and pump settings and developing a plan to avoid hypo- and hyper-glycemia.   Philemon Kingdom, MD PhD [facility]

## 2021-03-28 NOTE — Addendum Note (Signed)
Addended by: Kenyon Ana on: 03/28/2021 11:20 AM   Modules accepted: Orders

## 2021-03-28 NOTE — Patient Instructions (Addendum)
Please use the following pump settings:  basal rates: 12 am: 0.9 >> 1.0 units/h (may need to increase to 1.1 units/h after 4 days if needed) - ICR: 1:1  - target:  6 am: 150 >> 130 9 pm: 150 - ISF: 16 - Insulin on Board: 6h  Please try to enter 6-8g carbs per meal - 8-10g for dinner. Bolus 30-45 min before the meal.  If you do carb counting, then try to use an insulin to carb ratio of 1:15-20.  [facility] [phone].  Please return in 3 months.

## 2021-03-28 NOTE — Telephone Encounter (Signed)
Patient Advocate Encounter  Received notification from [employer]) that prior authorization for [facility] G6 TRANSMITTER AND SENSOR  is required.   PA submitted on 12.27.22 TRANSMITTER Key [id number] SENSOR Key [id number] Status is pending   [facility] will continue to follow  Ricke Hey, CPhT Patient Advocate [facility] Phone: [phone] Fax:  [phone]

## 2021-03-30 ENCOUNTER — Other Ambulatory Visit (HOSPITAL_COMMUNITY): Payer: Self-pay

## 2021-03-30 NOTE — Telephone Encounter (Signed)
Received notification from [employer]) regarding a prior authorization for DEXCOM G6 TRANSMITTER AND SENSOR. Authorization has been APPROVED from 12.27.22 to 12.29.23.   Per test claim with [facility], copay for 30 days supply is $0   Authorization #  PA Case ID: [id number]

## 2021-04-12 ENCOUNTER — Encounter: Payer: Managed Care, Other (non HMO) | Attending: Internal Medicine | Admitting: Nutrition

## 2021-04-12 ENCOUNTER — Other Ambulatory Visit: Payer: Self-pay

## 2021-04-12 DIAGNOSIS — Z794 Long term (current) use of insulin: Secondary | ICD-10-CM | POA: Insufficient documentation

## 2021-04-12 DIAGNOSIS — E1165 Type 2 diabetes mellitus with hyperglycemia: Secondary | ICD-10-CM | POA: Insufficient documentation

## 2021-04-13 NOTE — Patient Instructions (Addendum)
Read over manual for pump and for Dexcom Call help lines of both companies if questions. Read over insulin pump protocols and guides given

## 2021-04-13 NOTE — Progress Notes (Signed)
Patient is here today to switch from her Dash pump to the OmniPod 5 pump.  Help line was called and we unlinked her [employer] account from her dash and linked it to her 5 pump.  Settings were transferred from her Sharilyn Sites to her OmniPod 5 pump by the patient:  Basal rates: MN: 0.9u/hr, ICR: 1.1, target 150 with corrections over 150, ISF: 16, timing: 6 hours.  Pt. Filled a pod with U-500R insulin and attached it to her left arm without difficulty.  She was trained on how to use the Dexcom G6 sensors, and this was linked to her pump and to [place] endo.  She was encouraged to read the manual of both devices.  We reviewed the differences between the dash pump and the OmniPod 5 pump and the pump was put into the automated mode.  We reviewed all topics on the checklist and she signed it, indicating understanding of all topics with no final questions.   We reviewed sick day guidelines and how to use the temp basal for this.  Pump resource handout give for sick day guidelines, emergency supplies to have, high and low blood sugar protocols, and high and low blood sugar protocols was given to her and she was encouraged to read this.  She agreed to do this.

## 2021-04-14 ENCOUNTER — Telehealth: Payer: Self-pay | Admitting: Nutrition

## 2021-04-14 NOTE — Telephone Encounter (Signed)
LVM to call me to let me know how she is doing with this new pump.  Telephone number given to call me back 04/17/20  Patient reported that she has had no difficulty with her new pump, and has no questions for me at this time.

## 2021-07-12 ENCOUNTER — Other Ambulatory Visit: Payer: Self-pay | Admitting: Internal Medicine

## 2021-08-14 ENCOUNTER — Ambulatory Visit: Payer: Managed Care, Other (non HMO) | Admitting: Neurology

## 2021-10-20 ENCOUNTER — Other Ambulatory Visit: Payer: Self-pay | Admitting: Nurse Practitioner

## 2021-10-20 DIAGNOSIS — R102 Pelvic and perineal pain: Secondary | ICD-10-CM

## 2021-10-20 DIAGNOSIS — R87619 Unspecified abnormal cytological findings in specimens from cervix uteri: Secondary | ICD-10-CM

## 2021-10-26 ENCOUNTER — Ambulatory Visit
Admission: RE | Admit: 2021-10-26 | Discharge: 2021-10-26 | Disposition: A | Discharge status: Home / Self Care | Payer: BC Managed Care – PPO | Source: Ambulatory Visit | Attending: Nurse Practitioner | Admitting: Nurse Practitioner

## 2021-10-26 DIAGNOSIS — R87619 Unspecified abnormal cytological findings in specimens from cervix uteri: Secondary | ICD-10-CM

## 2021-10-26 DIAGNOSIS — R102 Pelvic and perineal pain: Secondary | ICD-10-CM

## 2021-12-07 ENCOUNTER — Other Ambulatory Visit (HOSPITAL_COMMUNITY): Payer: Self-pay

## 2021-12-07 ENCOUNTER — Telehealth: Payer: Self-pay

## 2021-12-07 NOTE — Telephone Encounter (Signed)
Patient Advocate Encounter   Received notification from [facility] that prior authorization is required for Dexcom G6 Series  Submitted: 12/07/2021 Key [id number]  Vicki Rivera, CPhT Rx Patient Advocate Phone: [phone]

## 2021-12-07 NOTE — Telephone Encounter (Signed)
Patient Advocate Encounter  Prior Authorization for Dexcom G6 Series has been approved.   Effective: 12/07/2021 to 12/08/2022 Determination letter has been added to patient chart.  Burnell Blanks, CPhT Rx Patient Advocate Phone: [phone]

## 2022-01-30 ENCOUNTER — Other Ambulatory Visit: Payer: Self-pay | Admitting: Internal Medicine

## 2022-01-30 ENCOUNTER — Encounter: Payer: Self-pay | Admitting: Internal Medicine

## 2022-02-02 ENCOUNTER — Other Ambulatory Visit: Payer: Self-pay | Admitting: Internal Medicine

## 2022-02-06 ENCOUNTER — Encounter: Payer: Self-pay | Admitting: Internal Medicine

## 2022-02-06 ENCOUNTER — Ambulatory Visit: Payer: BC Managed Care – PPO | Admitting: Internal Medicine

## 2022-02-06 VITALS — BP 132/80 | HR 102 | Ht 64.0 in | Wt 389.2 lb

## 2022-02-06 DIAGNOSIS — Z794 Long term (current) use of insulin: Secondary | ICD-10-CM | POA: Diagnosis not present

## 2022-02-06 DIAGNOSIS — E1165 Type 2 diabetes mellitus with hyperglycemia: Secondary | ICD-10-CM

## 2022-02-06 DIAGNOSIS — E785 Hyperlipidemia, unspecified: Secondary | ICD-10-CM

## 2022-02-06 DIAGNOSIS — Z6841 Body Mass Index (BMI) 40.0 and over, adult: Secondary | ICD-10-CM

## 2022-02-06 LAB — POCT GLYCOSYLATED HEMOGLOBIN (HGB A1C): Hemoglobin A1C: 8 % — AB (ref 4.0–5.6)

## 2022-02-06 MED ORDER — OMNIPOD 5 DEXG7G6 PODS GEN 5 MISC
3 refills | Status: DC
Start: 2022-02-06 — End: 2023-06-27

## 2022-02-06 NOTE — Progress Notes (Signed)
Patient ID: Vicki Rivera, female   DOB: October 04, 1975, 46 y.o.   MRN: [id number]  HPI: Vicki Rivera is a 46 y.o.-year-old female, initially referred by her PCP, Dr.Smith, returning for follow-up for  DM2, dx in 2015, insulin-dependent, uncontrolled, without long term complications. Last appt 11 months ago.  Interim history: No increased urination, blurry vision, nausea, chest pain. For the last week, she was out of insulin pods so she is doing insulin injections.  She had some lows in the 40s.  Reviewed HbA1c levels: Lab Results  Component Value Date   HGBA1C 8.7 (A) 03/28/2021   HGBA1C 9.1 (A) 01/10/2021   HGBA1C 9.6 (A) 09/01/2020  12/04/2016: HbA1c 10.1% 09/12/2015: HbA1c 10.6% 12/21/2014: 7.1%  Previously on: - Basaglar >> U200 Tresiba 30-34 units in a.m. and 40 units at night - Humalog 26-34 units before each meal, 3x a day  She tried Victoza >> N/V/AP and pancreatitis reportedly She tried Trulicity 5.63 mg weekly >> N/V She tried Invokana >> AP and severe nausea. 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, agricultural >> developed a facial rash (but unclear if from the Bai drinks she was drinking then)  At last visit she was on U500 insulin: - 60 units at waking up/40-60 units >> 80 units before B - 100-110 units before L >> 100-120 units before L - 60-70 units before D >> 70-80 units if exercising in the evening; 100-110 units before D  Insulin pump: - OmniPod Dash - started 2022 - now OmniPod 5 - started 04/2021 - auto mode (not for the last week due to not having pods)  Insulin: - U500  CGM: - Freestyle libre 2 - now [employer] G6  Supplies: - [facility] for both  Pump settings:  basal rates: 12 am: 0.8 >> 0.9 >> 1.0 >> 0.9 units/h - ICR: 1:1  - target:  6 am: 150 >> 130 >> 150 9 pm: 150 - ISF: 16 - Insulin on Board: 6h - bolus wizard: on - changes infusion site: q3 days Enter 6-8g carbs per meal - 8-10g for dinner. Bolus 30-45 min before the meal.  TDD from  basal insulin: 47% >> 54% >> 63% TDD from bolus insulin: 53% >> 46% >> 37% TDD: 30-40 units x 5 = ~200 units of U100 insulin  She checks her sugars more than 4 times a day with her Dexcom CGM:   Previously:   Lowest sugar was 60s (middle of the night) >> 112 >> 69 >> 40s - off pump . she has hypoglycemia awareness in the 150s Highest sugar was HI >> 360 >> 361 >> upper 300s.  Glucometer: Freestyle >> [employer] >> AccuChek guide  Diet: Mostly plant-based + cheese. She is following an anti-inflammatory diet-LEAP.  -No CKD, last BUN/creatinine: 06/27/2021: Glucose 131, 13/0.84, GFR 86 06/09/2020: Glucose 154, BUN/creatinine 12/0.66 Lab Results  Component Value Date   BUN 8 04/28/2020   CREATININE 0.80 04/28/2020  04/30/2019: Glu 226, BUN/Cr 10/0.69, GFR 92 04/24/2018: Glu 181, BUN/Cr 14/0.69, GFR 92   09/12/2015, from PCP: - CMP normal, except glucose 205. BUN/creatinine 6/0.64, EGFR 102. - ACR less than 30 01/18/2014: BUN/Cr 9/0.66, eGFR 99 On lisinopril.  -+ HL; last set of lipids: 06/27/2021: 136/154/45/65 06/09/2020: 151/199/41/77 Lab Results  Component Value Date   CHOL 140 04/28/2020   HDL 38.10 (L) 04/28/2020   LDLDIRECT 83.0 04/28/2020   TRIG 265.0 (H) 04/28/2020   CHOLHDL 4 04/28/2020  04/30/2019: 142/379/34/51 04/24/2018: 132/211/35/55 09/12/2015: 129/160/43/54 On Crestor 20.  -  last eye exam was in 2023: No DR (Dr. Valetta Close).  - No numbness and tingling in her feet.  She also has HTN, PCOS, anemia -on iron.  ROS: + See HPI  I reviewed pt's medications, allergies, PMH, social hx, family hx, and changes were documented in the history of present illness. Otherwise, unchanged from my initial visit note.  Past Medical History:  Diagnosis Date   Allergy    Anxiety    Asthma    Diabetes mellitus without complication ([place])    DM Type II    Hypertension    Insomnia    Morbid obesity (HCC)    PCOS (polycystic ovarian syndrome)    Sinusitis    Past  Surgical History:  Procedure Laterality Date   CHOLECYSTECTOMY     Social History   Social History   Marital Status: Single    Spouse Name: N/A   Number of Children: 0   Social History Main Topics   Smoking status: Never Smoker    Smokeless tobacco: Not on file   Alcohol Use: 0.0 oz/week    0 Standard drinks or equivalent per week   Drug Use: Not on file   Social History Narrative   Single (female partner-8 yrs)   0 children   Exercise- 2 times weekly   Caffeine use - 3-4 servings daily   [employer] ([employer])    Occup - [employer], [employer]   Current Outpatient Medications on File Prior to Visit  Medication Sig Dispense Refill   albuterol (VENTOLIN HFA) 108 (90 Base) MCG/ACT inhaler Inhale 2 puffs into the lungs every 6 (six) hours as needed.      Blood Glucose Monitoring Suppl (ACCU-CHEK GUIDE) w/Device KIT 1 each by Does not apply route daily. 1 kit 0   budesonide-formoterol (SYMBICORT) 160-4.5 MCG/ACT inhaler Inhale 2 puffs into the lungs 2 (two) times daily.     Continuous Blood Gluc Receiver (FREESTYLE LIBRE 2 READER) DEVI 1 Device by Does not apply route See admin instructions. For continuous glucose monitoring 1 each 1   Continuous Blood Gluc Sensor (FREESTYLE LIBRE 2 SENSOR) MISC APPLY AS DIRECTED AND CHANGE EVERY 14 DAYS 2 each 3   Continuous Blood Gluc Transmit (DEXCOM G6 TRANSMITTER) MISC 1 Device by Does not apply route every 3 (three) months. 1 each 3   desvenlafaxine (PRISTIQ) 100 MG 24 hr tablet Take 100 mg by mouth daily.      fluticasone (FLONASE) 50 MCG/ACT nasal spray Place 2 sprays into both nostrils daily.      glucose blood (ACCU-CHEK GUIDE) test strip Use to check blood sugars 3 times daily 100 each 12   Insulin Disposable Pump (OMNIPOD 5 G6 POD, GEN 5,) MISC APPLY 1 POD AS DIRECTED AND REPLACE POD EVERY 72 HOURS. 30 each 1   Insulin Pen Needle (PEN NEEDLES) 31G X 8 MM MISC Use once  every 3 days 50 each 3   insulin regular human CONCENTRATED (HUMULIN R U-500 KWIKPEN) 500 UNIT/ML KwikPen Use up to 250 units a day under skin as advised 90 mL 3   levocetirizine (XYZAL) 5 MG tablet Take 5 mg by mouth every evening.      lisinopril-hydrochlorothiazide (PRINZIDE,ZESTORETIC) 10-12.5 MG tablet Take 1 tablet by mouth daily.      montelukast (SINGULAIR) 5 MG chewable tablet Chew 10 mg by mouth at bedtime.      ONETOUCH DELICA LANCETS 60A MISC Use to test 3 times daily as  instructed. 300 each 3   rosuvastatin (CRESTOR) 20 MG tablet Take 20 mg by mouth daily.      No current facility-administered medications on file prior to visit.   Allergies  Allergen Reactions   Amoxicillin-Pot Clavulanate Hives   Benzonatate Other (See Comments)   Canagliflozin Nausea Only   Ceftin  [Cefuroxime] Hives   Ciprofloxacin Hives   Clarithromycin Other (See Comments)   Emetrol Nausea Only   Metformin Nausea Only   Sulfa Antibiotics Hives   Family History  Problem Relation Age of Onset   Hypertension Mother    Diabetes Mother    Hyperlipidemia Mother    Parkinson's disease Mother    Hyperlipidemia Father    Diabetes Sister        TYPE I    Diabetes Maternal Grandmother    Dementia Maternal Grandfather    Heart disease Paternal Grandfather        CAD; MI   PE: BP 132/80 (BP Location: Left Arm, Patient Position: Sitting, Cuff Size: Normal)   Pulse (!) 102   Ht _0  (1.626 m)   Wt (!) 389 lb 3.2 oz (176.5 kg)   SpO2 97%   BMI 66.81 kg/m  Wt Readings from Last 3 Encounters:  02/06/22 (!) 389 lb 3.2 oz (176.5 kg)  03/28/21 (!) 414 lb 6.4 oz (188 kg)  01/10/21 (!) 420 lb 6.4 oz (190.7 kg)   Constitutional: obese, in NAD Eyes: EOMI, no exophthalmos ENT: no thyromegaly, no cervical lymphadenopathy Cardiovascular: Tachycardia, RR, No MRG, + mild periankle edema bilaterally Respiratory: + end expiratory wheezes in B lung bases Musculoskeletal: no deformities Skin: no  rashes Neurological: no tremor with outstretched hands4 Diabetic Foot Exam - Simple   Simple Foot Form Diabetic Foot exam was performed with the following findings: Yes 02/06/2022  3:10 PM  Visual Inspection No deformities, no ulcerations, no other skin breakdown bilaterally: Yes Sensation Testing Intact to touch and monofilament testing bilaterally: Yes Pulse Check Posterior Tibialis and Dorsalis pulse intact bilaterally: Yes Comments    ASSESSMENT: 1. DM2, insulin-dependent, uncontrolled, without long term complications, but with hyperglycemia  2. HL  3. Obesity class III  PLAN:  1. Patient with uncontrolled type 2 diabetes, very insulin resistant, concentrated insulin (U-500) on an OmniPod insulin pump started last year.  She was initially on the [facility] but she switched to the OmniPod 5 since 04/2021.  She is currently in the automatic mode.  She returns after long absence of almost a year. -At last visit, we discussed about trying to switch from doing the proximal boluses to base her boluses on insulin to carb ratios.  I also advised her to increase basal rates and to vary the boluses more depending on the size and consistency of her meals and not to stagger the boluses. -At last visit HbA1c was lower, but still elevated, at 8.7%.  Reviewing the CGM data at that time, sugars are slightly better in the morning and midday but they were high during the night and after dinner.  She was bolusing fixed doses of insulin -   6U of U500 (equivalent of 30 units of U100) before each meal. CGM interpretation: -At today's visit, we reviewed her CGM downloads -for the last 2 weeks: It appears that 38% of values are in target range (goal >70%), while 62% are higher than 180 (goal <25%), and 0% are lower than 70 (goal <4%).  The calculated average blood sugar is 208.  The projected HbA1c for the  next 3 months (GMI) is 8.3%. -Reviewing the CGM trends, sugars are fluctuating around the upper limit  of the target range, but they are mostly above the target range, higher after every meal especially dinner.  After this meal, sugars fluctuate around 250s.  However, upon discussion with the patient, the last week of data is not representative for her blood sugar control since this was checked on insulin injections.  Reviewing the previous 2 weeks (see HPI) sugars have been more controlled but still with hyperglycemic spikes after coffee and after dinner.  Upon questioning, she did not start carb counting so she still has an insulin to carb ratio of 1:1-estimated boluses.  She is taking too little insulin before coffee (only 4 units) and we will increase this to 5-6 units.  Before lunch, we can continue with the current dose, but before dinner she definitely needs more.  She is still only using 6 units before dinner despite advised to increase the dose to 8-10 units at last visit. -We will continue the rest of the pump settings -I called in a prescription for pods for her.  She requested to change the prescription of to include 1 pod every 2 days. - I suggested to:  Patient Instructions  Please use the following pump settings:  basal rates: 12 am: 1 unit/h - ICR: 1:1  Enter: - coffee: 4 >> 5-6 units - lunch: 6 >> 5-6 units - dinner: 6 >> 8-10 units - target:  6 am: 150 9 pm: 150 - ISF: 16 - Insulin on Board: 6h  Bolus 30-45 min before the meal.  Please return in 6 months.  - we checked her HbA1c: 8% (lower) - advised to check sugars at different times of the day - 4x a day, rotating check times - advised for yearly eye exams >> she is UTD - return to clinic in 6 months   2. HL -Reviewed latest lipid panel from 05/2021: All fractions at goal -She continues on Crestor 20 mg daily without side effects  3. Obesity class III -Unfortunately, we need to continue concentrated insulin, which is weight inducing -She lost 6 pounds before last visit -Since then, she lost 25 lbs!  Philemon Kingdom, MD PhD [facility] Endocrinology

## 2022-02-06 NOTE — Patient Instructions (Addendum)
Please use the following pump settings:  basal rates: 12 am: 1 unit/h - ICR: 1:1  Enter: - coffee: 4 >> 5-6 units - lunch: 6 >> 5-6 units - dinner: 6 >> 8-10 units - target:  6 am: 150 9 pm: 150 - ISF: 16 - Insulin on Board: 6h  Bolus 30-45 min before the meal.  Please return in 6 months.

## 2022-03-17 ENCOUNTER — Encounter: Payer: Self-pay | Admitting: Internal Medicine

## 2022-03-17 DIAGNOSIS — E1165 Type 2 diabetes mellitus with hyperglycemia: Secondary | ICD-10-CM

## 2022-03-19 MED ORDER — DEXCOM G6 TRANSMITTER MISC: 1.0000 | 3 refills | Status: DC

## 2022-03-19 MED ORDER — DEXCOM G6 SENSOR MISC: 3 refills | Status: DC

## 2022-04-16 ENCOUNTER — Other Ambulatory Visit: Payer: Self-pay | Admitting: Internal Medicine

## 2022-08-07 ENCOUNTER — Ambulatory Visit: Payer: BC Managed Care – PPO | Admitting: Internal Medicine

## 2022-09-27 ENCOUNTER — Other Ambulatory Visit: Payer: Self-pay | Admitting: Internal Medicine

## 2022-10-17 ENCOUNTER — Encounter: Payer: Self-pay | Admitting: Internal Medicine

## 2022-10-17 ENCOUNTER — Ambulatory Visit: Payer: BC Managed Care – PPO | Admitting: Internal Medicine

## 2022-10-17 VITALS — BP 138/86 | HR 114 | Ht 64.0 in | Wt >= 6400 oz

## 2022-10-17 DIAGNOSIS — E1165 Type 2 diabetes mellitus with hyperglycemia: Secondary | ICD-10-CM | POA: Diagnosis not present

## 2022-10-17 DIAGNOSIS — Z794 Long term (current) use of insulin: Secondary | ICD-10-CM

## 2022-10-17 DIAGNOSIS — E785 Hyperlipidemia, unspecified: Secondary | ICD-10-CM | POA: Diagnosis not present

## 2022-10-17 DIAGNOSIS — E119 Type 2 diabetes mellitus without complications: Secondary | ICD-10-CM

## 2022-10-17 LAB — HEMOGLOBIN A1C: Hemoglobin A1C: 8.6

## 2022-10-17 NOTE — Patient Instructions (Addendum)
Please use the following pump settings: - basal rates: 12 am: 0.9 >> 1 unit/h - ICR: 1:1  Enter: - coffee: 6 units - lunch: 8-10 units - dinner: 8-10 units - target:  6 am: 150 9 pm: 150 - ISF: 16 - Insulin on Board: 6h  Bolus 30-45 min before the meal.  Please return in 6 months.

## 2022-10-17 NOTE — Progress Notes (Signed)
Patient ID: Vicki Rivera, female   DOB: 1975-08-23, 48 y.o.   MRN: [id number]  HPI: Vicki Rivera is a 47 y.o.-year-old female, initially referred by her PCP, Dr.Smith, returning for follow-up for  DM2, dx in 2015, insulin-dependent, uncontrolled, without long term complications. Last appt 8 months ago.  Interim history: No increased urination, blurry vision, nausea, chest pain. She did notice some hand tremors lately.  Reviewed HbA1c levels: Lab Results  Component Value Date   HGBA1C 8.0 (A) 02/06/2022   HGBA1C 8.7 (A) 03/28/2021   HGBA1C 9.1 (A) 01/10/2021  12/04/2016: HbA1c 10.1% 09/12/2015: HbA1c 10.6% 12/21/2014: 7.1%  Previously on: - Basaglar >> U200 Tresiba 30-34 units in a.m. and 40 units at night - Humalog 26-34 units before each meal, 3x a day  She tried Victoza >> N/V/AP and pancreatitis reportedly She tried Trulicity 0.75 mg weekly >> N/V She tried Invokana >> AP and severe nausea. She tried Metformin >> severe N/AP/D She tried Hospital doctor >> developed a facial rash (but unclear if from the Bai drinks she was drinking then)  At last visit she was on U500 insulin: - 60 units at waking up/40-60 units >> 80 units before B - 100-110 units before L >> 100-120 units before L - 60-70 units before D >> 70-80 units if exercising in the evening; 100-110 units before D  Insulin pump: - OmniPod Dash - started 2022 - now OmniPod 5 - started 04/2021 - auto mode  Insulin: - U500  CGM: - Freestyle libre 2 - now Dexcom G6  Supplies: - [facility] for both  Pump settings:   - basal rates: 12 am: 0.8 >> 0.9 >> 1.0 >> 0.9 units/h (forgot to change) - ICR: 1:1 >>  - coffee: 4 >> 5-6 units - lunch: 6 >> 5-6 units - dinner: 6 >> 8-10 units - target:  6 am: 150 >> 130 >> 150 9 pm: 150 - ISF: 16 - Insulin on Board: 6h - bolus wizard: on - changes infusion site: q3 days Enter 6-8g carbs per meal - 8-10g for dinner. Bolus 30-45 min before the meal.  TDD from basal insulin: 47%  >> 54% >> 63% >> 67% TDD from bolus insulin: 53% >> 46% >> 37% >> 33% TDD: 30-40 units x 5 = ~200 units of U100 insulin  She checks her sugars more than 4 times a day with her Dexcom CGM:  Previously:   Previously:   Lowest sugar was  69 >> 40s - off pump >> 60s. she has hypoglycemia awareness in the 150s Highest sugar was HI >> ... upper 300s >> 400s.  Glucometer: Freestyle >> [employer] >> AccuChek guide  Diet: Mostly plant-based + cheese. She is following an anti-inflammatory diet-LEAP.  -No CKD, last BUN/creatinine: Comp Metabolic Panel Reviewed date:07/24/2022 08:12:41 PM Interpretation:blood sugar 174 Performing Lab: Notes/Report: Testing Performed at: [facility], [address], [address], [place], [place] [address]  Glucose 174 70-99 mg/dL  BUN 12 6-44 mg/dL  Creatinine 0.34 7.42-5.95 mg/dl  [id number] 83 >32 calc  Sodium 137 136-145 mmol/L  Potassium 4.6 3.5-5.5 mmol/L  Chloride 101 98-107 mmol/L  CO2 28 22-32 mmol/L  Anion Gap 12.0 6.0-20.0 mmol/L  Calcium 9.7 8.6-10.3 mg/dL  CA-corrected 9.51 8.84-16.60 mg/dL  Protein, Total 6.6 6.3-0.1 g/dL  Albumin 4.0 6.0-1.0 g/dL  TBIL 0.7 9.3-2.3 mg/dL  ALP 58 55-732 U/L  AST 11 0-39 U/L  ALT 12 0-52 U/L  06/27/2021: Glucose 131, 13/0.84, GFR 86 06/09/2020: Glucose 154, BUN/creatinine 12/0.66 Lab  Results  Component Value Date   BUN 8 04/28/2020   CREATININE 0.80 04/28/2020  04/30/2019: Glu 226, BUN/Cr 10/0.69, GFR 92 04/24/2018: Glu 181, BUN/Cr 14/0.69, GFR 92   09/12/2015, from PCP: - CMP normal, except glucose 205. BUN/creatinine 6/0.64, EGFR 102. - ACR less than 30 01/18/2014: BUN/Cr 9/0.66, eGFR 99 On lisinopril.  -+ HL; last set of lipids: Lipid Panel w/reflex Reviewed date:07/24/2022 08:09:30 PM Interpretation:incr trig LDL 75 Performing Lab: Notes/Report: Testing Performed at: [facility], [address], [address], [place], [place] [address]  Cholesterol 160 <200 mg/dL  CHOL/HDL 4.0 0.8-6.5  Ratio  HDLD 40 30-85 mg/dL  Triglyceride 784 6-962 mg/dL  NHDL 952 8-413 mg/dL  LDL Chol Calc (NIH) 75 0-99 mg/dL  2/44/0102: 725/366/44/03 06/09/2020: 151/199/41/77 Lab Results  Component Value Date   CHOL 140 04/28/2020   HDL 38.10 (L) 04/28/2020   LDLDIRECT 83.0 04/28/2020   TRIG 265.0 (H) 04/28/2020   CHOLHDL 4 04/28/2020  04/30/2019: 142/379/34/51 04/24/2018: 132/211/35/55 09/12/2015: 129/160/43/54 On Crestor 20.  - last eye exam was in 2024: No DR reportedly (Dr. Cathey Endow).  - No numbness and tingling in her feet.  Last foot exam 01/2022.  She also has HTN, PCOS, anemia -on iron.  ROS: + See HPI  I reviewed pt's medications, allergies, PMH, social hx, family hx, and changes were documented in the history of present illness. Otherwise, unchanged from my initial visit note.  Past Medical History:  Diagnosis Date   Allergy    Anxiety    Asthma    Diabetes mellitus without complication ([facility])    DM Type II    Hypertension    Insomnia    Morbid obesity ([facility])    PCOS (polycystic ovarian syndrome)    Sinusitis    Past Surgical History:  Procedure Laterality Date   CHOLECYSTECTOMY     Social History   Social History   Marital Status: Single    Spouse Name: N/A   Number of Children: 0   Social History Main Topics   Smoking status: Never Smoker    Smokeless tobacco: Not on file   Alcohol Use: 0.0 oz/week    0 Standard drinks or equivalent per week   Drug Use: Not on file   Social History Narrative   Single (female partner-8 yrs)   0 children   Exercise- 2 times weekly   Caffeine use - 3-4 servings daily   College Graduate - [address] [employer] ([employer])    Occup - [employer], [employer]   Current Outpatient Medications on File Prior to Visit  Medication Sig Dispense Refill   albuterol (VENTOLIN HFA) 108 (90 Base) MCG/ACT inhaler Inhale 2 puffs into the lungs every 6 (six) hours as needed.      Blood Glucose  Monitoring Suppl (ACCU-CHEK GUIDE) w/Device KIT 1 each by Does not apply route daily. 1 kit 0   budesonide-formoterol (SYMBICORT) 160-4.5 MCG/ACT inhaler Inhale 2 puffs into the lungs 2 (two) times daily.     Continuous Blood Gluc Receiver (FREESTYLE LIBRE 2 READER) DEVI 1 Device by Does not apply route See admin instructions. For continuous glucose monitoring 1 each 1   Continuous Blood Gluc Sensor (DEXCOM G6 SENSOR) MISC Use as instructed, change every 10 days 9 each 3   Continuous Blood Gluc Transmit (DEXCOM G6 TRANSMITTER) MISC 1 Device by Does not apply route every 3 (three) months. 1 each 3   desvenlafaxine (PRISTIQ) 100 MG 24 hr tablet Take 100 mg by mouth  daily.      fluticasone (FLONASE) 50 MCG/ACT nasal spray Place 2 sprays into both nostrils daily.      glucose blood (ACCU-CHEK GUIDE) test strip Use to check blood sugars 3 times daily 100 each 12   Insulin Disposable Pump (OMNIPOD 5 G6 POD, GEN 5,) MISC Use every 2 days as advised 45 each 3   Insulin Pen Needle (PEN NEEDLES) 31G X 8 MM MISC Use once every 3 days 50 each 3   insulin regular human CONCENTRATED (HUMULIN R U-500 KWIKPEN) 500 UNIT/ML KwikPen INJECT UP TO 250 UNITS DAILY UNDER THE SKIN AS ADVISED 48 mL 1   levocetirizine (XYZAL) 5 MG tablet Take 5 mg by mouth every evening.      lisinopril-hydrochlorothiazide (PRINZIDE,ZESTORETIC) 10-12.5 MG tablet Take 1 tablet by mouth daily.      montelukast (SINGULAIR) 5 MG chewable tablet Chew 10 mg by mouth at bedtime.      ONETOUCH DELICA LANCETS 33G MISC Use to test 3 times daily as instructed. 300 each 3   rosuvastatin (CRESTOR) 20 MG tablet Take 20 mg by mouth daily.      No current facility-administered medications on file prior to visit.   Allergies  Allergen Reactions   Amoxicillin-Pot Clavulanate Hives   Benzonatate Other (See Comments)   Canagliflozin Nausea Only   Ceftin  [Cefuroxime] Hives   Ciprofloxacin Hives   Clarithromycin Other (See Comments)   Emetrol Nausea  Only   Metformin Nausea Only   Sulfa Antibiotics Hives   Family History  Problem Relation Age of Onset   Hypertension Mother    Diabetes Mother    Hyperlipidemia Mother    Parkinson's disease Mother    Hyperlipidemia Father    Diabetes Sister        TYPE I    Diabetes Maternal Grandmother    Dementia Maternal Grandfather    Heart disease Paternal Grandfather        CAD; MI   PE: BP 138/86   Pulse (!) 114   Ht 5\' 4"  (1.626 m)   Wt (!) 402 lb (182.3 kg)   SpO2 98%   BMI 69.00 kg/m  Wt Readings from Last 3 Encounters:  10/17/22 (!) 402 lb (182.3 kg)  02/06/22 (!) 389 lb 3.2 oz (176.5 kg)  03/28/21 (!) 414 lb 6.4 oz (188 kg)   Constitutional: obese, in NAD Eyes: EOMI, no exophthalmos ENT: no thyromegaly, no cervical lymphadenopathy Cardiovascular: Tachycardia, RR, No MRG Respiratory: CTA B Musculoskeletal: no deformities Skin: no rashes Neurological: + tremor with outstretched hands  ASSESSMENT: 1. DM2, insulin-dependent, uncontrolled, without long term complications, but with hyperglycemia  2. HL  3. Obesity class III  PLAN:  1. Patient with uncontrolled, type 2 diabetes, very insulin resistant, on concentrated insulin (U-500) in the OmniPod insulin pump started 2022.  She was initially on the [facility] but she was able to switch to the OmniPod 5 in 04/2021.  At last visit she returned after a long absence of almost a year.  At that time, HbA1c was still elevated, slightly improved, at 8.0%.  Sugars were increasing throughout the day and they were higher than target after every meal, especially dinner.  I advised her to increase her insulin with meals.  I also called in a prescription for the OmniPod 5 to change it every 2 rather than 3 days.   - She now returns after 8 months. CGM interpretation: -At today's visit, we reviewed her CGM downloads: It appears that 39%  of values are in target range (goal >70%), while 61% are higher than 180 (goal <25%), and 0% are  lower than 70 (goal <4%).  The calculated average blood sugar is 203.  The projected HbA1c for the next 3 months (GMI) is 8.2%. -Reviewing the CGM trends, sugars appear to be slightly better controlled in the second half of the night but they increase slightly after coffee and quite significantly after lunch and dinner.  Upon reviewing her pump downloads, she forgot to increase her basal rates at last visit so I entered the change in the pump for her today.  She did increase the insulin that she boluses before lunch and dinner, however, this does not appear to be sufficient.  I advised her to continue to increase the boluses until her blood sugars remain controlled after meals.  For now, I advised her to inject 8 to 10 units, instead of mostly 6 units.  She can continue to bolus the same amount of insulin for coffee, since sugars after coffee are not increasing significantly. - I suggested to:  Patient Instructions  Please use the following pump settings: - basal rates: 12 am: 0.9 >> 1 unit/h - ICR: 1:1  Enter: - coffee: 6 units - lunch: 8-10 units - dinner: 8-10 units - target:  6 am: 150 9 pm: 150 - ISF: 16 - Insulin on Board: 6h  Bolus 30-45 min before the meal.  Please return in 6 months.  - we checked her HbA1c: 8.6% (higher) - advised to check sugars at different times of the day - 4x a day, rotating check times - advised for yearly eye exams >> she is UTD - return to clinic in 6 months   2. HL -Reviewed lipid panel from 07/2022: Fractions at goal with the exception of high triglycerides -She continues on Crestor 10 mg daily without side effects  3. Obesity class III -Unfortunately, we need to continue concentrated insulin, which is weight inducing -Before last visit, she lost 25 pounds, previously lost 6. -she gained back 13 lbs since last OV  Carlus Pavlov, MD PhD [facility] Endocrinology

## 2022-10-23 ENCOUNTER — Encounter: Payer: Self-pay | Admitting: Internal Medicine

## 2022-10-25 ENCOUNTER — Encounter: Payer: Self-pay | Admitting: Internal Medicine

## 2022-12-05 ENCOUNTER — Encounter: Payer: Self-pay | Admitting: Internal Medicine

## 2022-12-06 MED ORDER — HUMULIN R U-500 KWIKPEN 500 UNIT/ML ~~LOC~~ SOPN: PEN_INJECTOR | SUBCUTANEOUS | 1 refills | Status: DC

## 2023-01-31 ENCOUNTER — Telehealth: Payer: Self-pay

## 2023-01-31 ENCOUNTER — Encounter: Payer: Self-pay | Admitting: Internal Medicine

## 2023-01-31 ENCOUNTER — Other Ambulatory Visit (HOSPITAL_COMMUNITY): Payer: Self-pay

## 2023-01-31 DIAGNOSIS — E1165 Type 2 diabetes mellitus with hyperglycemia: Secondary | ICD-10-CM

## 2023-01-31 MED ORDER — DEXCOM G6 TRANSMITTER MISC: 1.0000 | 3 refills | Status: DC

## 2023-01-31 MED ORDER — DEXCOM G6 SENSOR MISC: 3 refills | Status: DC

## 2023-01-31 NOTE — Telephone Encounter (Signed)
Pharmacy Patient Advocate Encounter   Received notification from Pt Calls Messages that prior authorization for Dexcom G6 sensor is required/requested.   Insurance verification completed.   The patient is insured through [facility] .   Per test claim: PA required; PA submitted to above mentioned insurance via CoverMyMeds Key/confirmation #/[id number] Status is pending

## 2023-01-31 NOTE — Telephone Encounter (Signed)
Patient needs a PA for Dexcom G6

## 2023-02-01 ENCOUNTER — Other Ambulatory Visit (HOSPITAL_COMMUNITY): Payer: Self-pay

## 2023-02-01 NOTE — Telephone Encounter (Signed)
Pharmacy Patient Advocate Encounter  Received notification from [facility] that Prior Authorization for Dexcom G6 sensor has been APPROVED through 01/30/2024   PA #/Case ID/Reference #: [id number]

## 2023-04-19 ENCOUNTER — Ambulatory Visit: Payer: 59 | Admitting: Internal Medicine

## 2023-04-19 ENCOUNTER — Encounter: Payer: Self-pay | Admitting: Internal Medicine

## 2023-04-19 VITALS — BP 122/70 | HR 98 | Ht 64.0 in | Wt >= 6400 oz

## 2023-04-19 DIAGNOSIS — E1165 Type 2 diabetes mellitus with hyperglycemia: Secondary | ICD-10-CM | POA: Diagnosis not present

## 2023-04-19 DIAGNOSIS — Z6841 Body Mass Index (BMI) 40.0 and over, adult: Secondary | ICD-10-CM

## 2023-04-19 DIAGNOSIS — Z794 Long term (current) use of insulin: Secondary | ICD-10-CM

## 2023-04-19 DIAGNOSIS — E66813 Obesity, class 3: Secondary | ICD-10-CM

## 2023-04-19 DIAGNOSIS — E785 Hyperlipidemia, unspecified: Secondary | ICD-10-CM | POA: Diagnosis not present

## 2023-04-19 LAB — POCT GLYCOSYLATED HEMOGLOBIN (HGB A1C): Hemoglobin A1C: 8.6 % — AB (ref 4.0–5.6)

## 2023-04-19 NOTE — Patient Instructions (Addendum)
Please use the following pump settings: - basal rates: 12 am: 1 unit/h 5 am: 1 >> 1.15 units/h - ICR: 1:1  Enter: - coffee: 6 units - lunch: 8-10 units - dinner: 8-10 units - target:  6 am: 150 >> 130 9 pm: 150 >> 130 - ISF: 16 - Insulin on Board: 6h  Bolus 30-45 min before the meal.  Increase: - max basal rate to 1.30 (may need a higher basal rate)  Please return in 6 months.

## 2023-04-19 NOTE — Progress Notes (Signed)
Patient ID: Vicki Rivera, female   DOB: [birth year], 48 y.o.   MRN: [id number]  HPI: Vicki Rivera is a 48 y.o.-year-old female, initially referred by her PCP, Dr.Smith, returning for follow-up for  DM2, dx in 2015, insulin-dependent, uncontrolled, without long term complications. Last appt 6 months ago.  Interim history: No increased urination, blurry vision, nausea, chest pain.  She continues to have L>R tremors.  Reviewed HbA1c levels: 10/17/2022: HbA1c 8.6% Lab Results  Component Value Date   HGBA1C 8.0 (A) 02/06/2022   HGBA1C 8.7 (A) 03/28/2021   HGBA1C 9.1 (A) 01/10/2021  12/04/2016: HbA1c 10.1% 09/12/2015: HbA1c 10.6% 12/21/2014: 7.1%  Previously on: - Basaglar >> U200 Tresiba 30-34 units in a.m. and 40 units at night - Humalog 26-34 units before each meal, 3x a day  She tried Victoza >> N/V/AP and pancreatitis reportedly She tried Trulicity 0.75 mg weekly >> N/V She tried Invokana >> AP and severe nausea. She tried Metformin >> severe N/AP/D She tried Hospital doctor >> developed a facial rash (but unclear if from the Bai drinks she was drinking then)  At last visit she was on U500 insulin: - 60 units at waking up/40-60 units >> 80 units before B - 100-110 units before L >> 100-120 units before L - 60-70 units before D >> 70-80 units if exercising in the evening; 100-110 units before D  Insulin pump: - OmniPod Dash - started 2022 - now OmniPod 5 - started 04/2021 - auto mode  Insulin: - U500  CGM: - Freestyle libre 2 - now Dexcom G6  Supplies: [facility]  Pump settings:   - basal rates: 12 am: 0.9 >> 1 unit/h - ICR: 1:1  Enter: - coffee: 6 units - lunch: 8-10 units - dinner: 8-10 units - target:  6 am: 150 9 pm: 150 - ISF: 16 - Insulin on Board: 6h Bolus 30-45 min before the meal.  TDD from basal insulin: 47% >> 54% >> 63% >> 67% >> 56% (21.5 units)  TDD from bolus insulin: 53% >> 46% >> 37% >> 33% >> 44% (16.8 units) TDD: 30-40 units x 5 = ~200  units of U100 insulin Changes the OmniPod every 2 days.  She checks her sugars more than 4 times a day with her Dexcom CGM:  Previously:  Previously:   Lowest sugar was  69 >> 40s - off pump >> 60s>> 63. she has hypoglycemia awareness in the 150s Highest sugar was HI >> ... upper 300s >> 400s >> 330  Glucometer: Freestyle >> Bayer Contour >> AccuChek guide  Diet: Mostly plant-based + cheese. She is following an anti-inflammatory diet-LEAP.  -No CKD, last BUN/creatinine: Comp Metabolic Panel Reviewed date:07/24/2022 08:12:41 PM Interpretation:blood sugar 174 Performing Lab: Notes/Report: Testing Performed at: [facility], [address], [address], [place], [place] [address]  Glucose 174 70-99 mg/dL  BUN 12 4-03 mg/dL  Creatinine 4.74 2.59-5.63 mg/dl  [id number] 83 >95 calc  Sodium 137 136-145 mmol/L  Potassium 4.6 3.5-5.5 mmol/L  Chloride 101 98-107 mmol/L  CO2 28 22-32 mmol/L  Anion Gap 12.0 6.0-20.0 mmol/L  Calcium 9.7 8.6-10.3 mg/dL  CA-corrected 1.88 4.16-60.63 mg/dL  Protein, Total 6.6 0.1-6.0 g/dL  Albumin 4.0 1.0-9.3 g/dL  TBIL 0.7 2.3-5.5 mg/dL  ALP 58 73-220 U/L  AST 11 0-39 U/L  ALT 12 0-52 U/L   Lab Results  Component Value Date   BUN 8 04/28/2020   CREATININE 0.80 04/28/2020   Lab Results  Component Value Date   MICRALBCREAT  1.4 04/28/2020   MICRALBCREAT 2.8 04/16/2017  On lisinopril.  -+ HL; last set of lipids: Lipid Panel w/reflex Reviewed date:07/24/2022 08:09:30 PM Interpretation:incr trig LDL 75 Performing Lab: Notes/Report: Testing Performed at: [facility], [address], [address], [place], [place] [address]  Cholesterol 160 <200 mg/dL  CHOL/HDL 4.0 4.1-3.2 Ratio  HDLD 40 30-85 mg/dL  Triglyceride 440 1-027 mg/dL  NHDL 253 6-644 mg/dL  LDL Chol Calc (NIH) 75 0-99 mg/dL   Lab Results  Component Value Date   CHOL 140 04/28/2020   HDL 38.10 (L) 04/28/2020   LDLDIRECT 83.0 04/28/2020   TRIG 265.0 (H) 04/28/2020   CHOLHDL 4  04/28/2020  On Crestor 20.  - last eye exam was in 2024: No DR reportedly (Dr. Cathey Endow).  - No numbness and tingling in her feet.  Last foot exam 01/2022.  She also has HTN, PCOS, anemia -on iron.  ROS: + See HPI  I reviewed pt's medications, allergies, PMH, social hx, family hx, and changes were documented in the history of present illness. Otherwise, unchanged from my initial visit note.  Past Medical History:  Diagnosis Date   Allergy    Anxiety    Asthma    Diabetes mellitus without complication (HCC)    DM Type II    Hypertension    Insomnia    Morbid obesity (HCC)    PCOS (polycystic ovarian syndrome)    Sinusitis    Past Surgical History:  Procedure Laterality Date   CHOLECYSTECTOMY     Social History   Social History   Marital Status: Single    Spouse Name: N/A   Number of Children: 0   Social History Main Topics   Smoking status: Never Smoker    Smokeless tobacco: Not on file   Alcohol Use: 0.0 oz/week    0 Standard drinks or equivalent per week   Drug Use: Not on file   Social History Narrative   Single (female partner-8 yrs)   0 children   Exercise- 2 times weekly   Caffeine use - 3-4 servings daily   College Graduate - [address] [employer] (Master's)    Occup - [employer], [employer]   Current Outpatient Medications on File Prior to Visit  Medication Sig Dispense Refill   albuterol (VENTOLIN HFA) 108 (90 Base) MCG/ACT inhaler Inhale 2 puffs into the lungs every 6 (six) hours as needed.      Blood Glucose Monitoring Suppl (ACCU-CHEK GUIDE) w/Device KIT 1 each by Does not apply route daily. 1 kit 0   budesonide-formoterol (SYMBICORT) 160-4.5 MCG/ACT inhaler Inhale 2 puffs into the lungs 2 (two) times daily.     buPROPion (WELLBUTRIN XL) 300 MG 24 hr tablet Take 300 mg by mouth daily.     Continuous Glucose Sensor (DEXCOM G6 SENSOR) MISC Use as instructed, change every 10 days 9 each 3   Continuous Glucose  Transmitter (DEXCOM G6 TRANSMITTER) MISC 1 Device by Does not apply route every 3 (three) months. 1 each 3   desvenlafaxine (PRISTIQ) 100 MG 24 hr tablet Take 100 mg by mouth daily.      fluticasone (FLONASE) 50 MCG/ACT nasal spray Place 2 sprays into both nostrils daily.      glucose blood (ACCU-CHEK GUIDE) test strip Use to check blood sugars 3 times daily 100 each 12   Insulin Disposable Pump (OMNIPOD 5 G6 POD, GEN 5,) MISC Use every 2 days as advised 45 each 3   Insulin Pen Needle (PEN  NEEDLES) 31G X 8 MM MISC Use once every 3 days 50 each 3   insulin regular human CONCENTRATED (HUMULIN R U-500 KWIKPEN) 500 UNIT/ML KwikPen INJECT UP TO 300 UNITS DAILY UNDER THE SKIN AS ADVISED 60 mL 1   levocetirizine (XYZAL) 5 MG tablet Take 5 mg by mouth every evening.      lisinopril-hydrochlorothiazide (PRINZIDE,ZESTORETIC) 10-12.5 MG tablet Take 1 tablet by mouth daily.      montelukast (SINGULAIR) 5 MG chewable tablet Chew 10 mg by mouth at bedtime.      ONETOUCH DELICA LANCETS 33G MISC Use to test 3 times daily as instructed. 300 each 3   rosuvastatin (CRESTOR) 20 MG tablet Take 20 mg by mouth daily.      No current facility-administered medications on file prior to visit.   Allergies  Allergen Reactions   Amoxicillin-Pot Clavulanate Hives   Benzonatate Other (See Comments)   Canagliflozin Nausea Only   Ceftin  [Cefuroxime] Hives   Ciprofloxacin Hives   Clarithromycin Other (See Comments)   Emetrol Nausea Only   Metformin Nausea Only   Sulfa Antibiotics Hives   Family History  Problem Relation Age of Onset   Hypertension Mother    Diabetes Mother    Hyperlipidemia Mother    Parkinson's disease Mother    Hyperlipidemia Father    Diabetes Sister        TYPE I    Diabetes Maternal Grandmother    Dementia Maternal Grandfather    Heart disease Paternal Grandfather        CAD; MI   PE: BP 122/70   Pulse 98   Ht 5\' 4"  (1.626 m)   Wt (!) 409 lb (185.5 kg)   SpO2 97%   BMI 70.20 kg/m   Wt Readings from Last 3 Encounters:  04/19/23 (!) 409 lb (185.5 kg)  10/17/22 (!) 402 lb (182.3 kg)  02/06/22 (!) 389 lb 3.2 oz (176.5 kg)   Constitutional: obese, in NAD Eyes: EOMI, no exophthalmos ENT: no thyromegaly, no cervical lymphadenopathy Cardiovascular: Tachycardia, RR, No MRG Respiratory: CTA B Musculoskeletal: no deformities Skin: no rashes Neurological: + tremor with outstretched hands Diabetic Foot Exam - Simple   Simple Foot Form Diabetic Foot exam was performed with the following findings: Yes 04/19/2023 10:40 AM  Visual Inspection No deformities, no ulcerations, no other skin breakdown bilaterally: Yes Sensation Testing Intact to touch and monofilament testing bilaterally: Yes Pulse Check Posterior Tibialis and Dorsalis pulse intact bilaterally: Yes Comments Dry skin    ASSESSMENT: 1. DM2, insulin-dependent, uncontrolled, without long term complications, but with hyperglycemia  2. HL  3. Obesity class III  PLAN:  1. Patient with uncontrolled, type 2 diabetes, very insulin resistant U-500 concentrated insulin in the OmniPod insulin pump-started 2022.  She was initially on the [facility] but she was able to switch to the OmniPod 5 in 04/2021.  She changes the OmniPod every 2 rather than 3 days.  At last visit, sugars appears to be slightly better controlled in the second half of the night but they were increasing slightly after coffee and quite significant after lunch and dinner.  Upon reviewing the pump download, she had forgotten to increase her basal rates as recommended at the previous visit so I entered the changes into the pump for her.  She did increase the insulin that she was bolusing before lunch and dinner but this was not sufficient and I advised her to continue to increase the doses until her blood sugars remain controlled after  meals 180.  She was using mostly 6 units for these meals and I recommended 8-10. HbA1c was 8.6% at last visit, increased. -  She now returns after 8 months. CGM interpretation: -At today's visit, we reviewed her CGM downloads: It appears that 41% of values are in target range (goal >70%), while 59% are higher than 180 (goal <25%), and 0% are lower than 70 (goal <4%).  The calculated average blood sugar is 200.  The projected HbA1c for the next 3 months (GMI) is 8.1%. -Reviewing the CGM trends, sugars are fluctuating above the upper limit of the target range except for the second half of the night and early morning, when they are higher in the target range.  Overall, glucose levels increase after every meal and remain elevated in the evening.  Upon reviewing individual daily pump downloads, it appears that she is not bolusing consistently particularly for lunch and she sometimes boluses late, after she already started to eat.  We discussed about the need to bolus 30 to 40 minutes before a meal.  She plans to set alarms to remember to do so.  I did not recommend an increase in mealtime boluses for now until she makes sure that she is bolusing for every meal at the right time.  However, we discussed about decreasing her basal rates after approximately 5 AM.  While changing his in her pump, I noticed that her maximum basal rate was set at 1 units an hour.  We discussed that this is not enough and I increased this to 1.3 units an hour.  Also, her targets are set at 150 >> lowered to 130.  - I suggested to:  Patient Instructions  Please use the following pump settings: - basal rates: 12 am: 1 unit/h 5 am: 1 >> 1.15 units/h - ICR: 1:1  Enter: - coffee: 6 units - lunch: 8-10 units - dinner: 8-10 units - target:  6 am: 150 >> 130 9 pm: 150 >> 130 - ISF: 16 - Insulin on Board: 6h  Bolus 30-45 min before the meal.  Increase: - max basal rate to 1.30 (may need a higher basal rate)  Please return in 6 months.  - we checked her HbA1c: 8.6% (stable) - advised to check sugars at different times of the day - 4x a day, rotating  check times - advised for yearly eye exams >> she is UTD - will check an ACR today - return to clinic in  6 months   2. HL - Reviewed latest lipid panel from 07/2022: Fractions at goal with the exception of high triglycerides -She continues Crestor 10 mg daily without side effects  3. Obesity class III -We unfortunately need to continue concentrated insulin, which is weight inducing -She gained 13 pounds before last visit, previously lost 31 pounds before the prior 2 visits -She gained 7 pounds before this visit  Carlus Pavlov, MD PhD [facility] Endocrinology

## 2023-04-20 LAB — MICROALBUMIN / CREATININE URINE RATIO
Creatinine, Urine: 291 mg/dL — ABNORMAL HIGH (ref 20–275)
Microalb Creat Ratio: 3 mg/g{creat} (ref <30)
Microalb, Ur: 0.8 mg/dL

## 2023-04-22 ENCOUNTER — Encounter: Payer: Self-pay | Admitting: Internal Medicine

## 2023-06-26 ENCOUNTER — Encounter: Payer: Self-pay | Admitting: Internal Medicine

## 2023-06-27 MED ORDER — OMNIPOD 5 DEXG7G6 PODS GEN 5 MISC: 3 refills | Status: AC

## 2023-08-18 ENCOUNTER — Other Ambulatory Visit: Payer: Self-pay | Admitting: Internal Medicine

## 2023-09-01 ENCOUNTER — Encounter: Payer: Self-pay | Admitting: Internal Medicine

## 2023-09-02 MED ORDER — PEN NEEDLES 31G X 8 MM MISC: 4 refills | Status: AC

## 2023-09-02 MED ORDER — PEN NEEDLES 31G X 8 MM MISC: 4 refills | Status: DC

## 2023-09-02 NOTE — Addendum Note (Signed)
 Addended by: Vernon Goodpasture on: 09/02/2023 10:10 AM   Modules accepted: Orders

## 2023-10-17 ENCOUNTER — Encounter: Payer: Self-pay | Admitting: Internal Medicine

## 2023-10-17 ENCOUNTER — Ambulatory Visit: Payer: 59 | Admitting: Internal Medicine

## 2023-10-17 VITALS — BP 130/80 | HR 104 | Ht 64.0 in | Wt >= 6400 oz

## 2023-10-17 DIAGNOSIS — Z794 Long term (current) use of insulin: Secondary | ICD-10-CM | POA: Diagnosis not present

## 2023-10-17 DIAGNOSIS — E66813 Obesity, class 3: Secondary | ICD-10-CM | POA: Diagnosis not present

## 2023-10-17 DIAGNOSIS — E785 Hyperlipidemia, unspecified: Secondary | ICD-10-CM

## 2023-10-17 DIAGNOSIS — Z6841 Body Mass Index (BMI) 40.0 and over, adult: Secondary | ICD-10-CM

## 2023-10-17 DIAGNOSIS — E1165 Type 2 diabetes mellitus with hyperglycemia: Secondary | ICD-10-CM

## 2023-10-17 LAB — POCT GLYCOSYLATED HEMOGLOBIN (HGB A1C): Hemoglobin A1C: 7.9 % — AB (ref 4.0–5.6)

## 2023-10-17 NOTE — Progress Notes (Signed)
 Patient ID: Vicki Rivera, female   DOB: [birth year], 48 y.o.   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 Last appt 6 months ago.  Interim history: No increased urination, blurry vision, nausea, chest pain.  She continues to have L>R tremors. 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 She just started Pilates 2 weeks ago.  She likes it.   Reviewed HbA1c levels: Lab Results  Component Value Date   HGBA1C 8.6 (A) 04/19/2023   HGBA1C 8.6 10/17/2022   HGBA1C 8.0 (A) 02/06/2022   HGBA1C 8.7 (A) 03/28/2021   HGBA1C 9.1 (A) 01/10/2021   HGBA1C 9.6 (A) 09/01/2020   HGBA1C 10.7 (A) 04/28/2020   HGBA1C 9.6 (A) 01/19/2020   HGBA1C 10.0 (A) 06/10/2019   HGBA1C 10.7 (A) 02/10/2019   HGBA1C 10.3 (A) 10/10/2018   HGBA1C 9.8 (A) 06/10/2018   HGBA1C 9.0 (A) 10/15/2017   HGBA1C 9.0 07/15/2017   HGBA1C 9.3 04/16/2017   HGBA1C 7.6 02/07/2016   HGBA1C 9.3 06/07/2015  10/17/2022: HbA1c 8.6% 12/04/2016: HbA1c 10.1% 09/12/2015: HbA1c 10.6% 12/21/2014: 7.1%  Previously on: - Basaglar  >> U200 Tresiba  30-34 units in a.m. and 40 units at night - Humalog  26-34 units before each meal, 3x a day  She tried Victoza >> N/V/AP and pancreatitis reportedly She tried Trulicity 0.75 mg weekly >> N/V She tried Invokana >> AP and severe nausea. She tried Metformin >> severe N/AP/D She tried Basaglar  >> developed a facial rash (but unclear if from the Bai drinks she was drinking then)  At last visit she was on U500 insulin : - 60 units at waking up/40-60 units >> 80 units before B - 100-110 units before L >> 100-120 units before L - 60-70 units before D >> 70-80 units if exercising in the evening; 100-110 units before D  Insulin  pump: - OmniPod Dash - started 2022 - now OmniPod 5 - started 04/2021 -  auto mode  Insulin : - U500  CGM: - Freestyle libre 2 - now Dexcom G6  Supplies: - [facility] for both  Pump settings:  - basal rates: 12 am: 1 unit/h 5 am: 1 >> 1.15 units/h - ICR: 1:1  - coffee: 6  >> actually using 6-8 units - lunch: 8-10 >> actually using 6-8 units - dinner: 8-10 >> actually using 6-8 units - target:  6 am: 150 >> 130 9 pm: 150 >> 130 - ISF: 16 - Insulin  on Board: 6h Bolus 30-45 min before the meal.  TDD from basal insulin : 47% >> 54% >> 63% >> 67% >> 56% (21.5 units) >> 61% (26 units) TDD from bolus insulin : 53% >> 46% >> 37% >> 33% >> 44% (16.8 units) >> 39% (17 units)  TDD: Up to 250 units a day Changes the OmniPod every 2 days.  She checks her sugars more than 4 times a day with her Dexcom CGM:  Prev.:  Previously:  Previously:   Lowest sugar was  40s - off pump >> 60s>> 63 >> 60s. she has hypoglycemia awareness in the 150s Highest sugar was HI >> ... upper 300s >> 400s >> 330 >> 400.  Glucometer: Freestyle >> [employer] >> AccuChek guide  Diet: Mostly plant-based + cheese. She is following an anti-inflammatory diet-LEAP.  -No CKD, last BUN/creatinine: 09/19/2023: BUN 13, GFR 84, glucose 131 Lab Results  Component Value  Date   BUN 8 04/28/2020   CREATININE 0.80 04/28/2020   Lab Results  Component Value Date   MICRALBCREAT 3 04/19/2023  On lisinopril.  -+ HL; last set of lipids: 09/19/2023: 150/156/45/78 07/24/2022: 160/278/40/75 Lab Results  Component Value Date   CHOL 140 04/28/2020   HDL 38.10 (L) 04/28/2020   LDLDIRECT 83.0 04/28/2020   TRIG 265.0 (H) 04/28/2020   CHOLHDL 4 04/28/2020  On Crestor 20.  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  - No numbness and tingling in her feet.  Last foot exam 04/2023.  She also has HTN, PCOS, anemia -on iron.  ROS: + See HPI  I reviewed pt's medications, allergies, PMH, social hx, family hx, and changes were documented in the history of present illness.  Otherwise, unchanged from my initial visit note.  Past Medical History:  Diagnosis Date   Allergy    Anxiety    Asthma    Diabetes mellitus without complication (HCC)    DM Type II    Hypertension    Insomnia    Morbid obesity (HCC)    PCOS (polycystic ovarian syndrome)    Sinusitis    Past Surgical History:  Procedure Laterality Date   CHOLECYSTECTOMY     Social History   Social History   Marital Status: Single    Spouse Name: N/A   Number of Children: 0   Social History Main Topics   Smoking status: Never Smoker    Smokeless tobacco: Not on file   Alcohol Use: 0.0 oz/week    0 Standard drinks or equivalent per week   Drug Use: Not on file   Social History Narrative   Single (female partner-8 yrs)   0 children   Exercise- 2 times weekly   Caffeine use - 3-4 servings daily   College Graduate - [address] [employer] (Master's)    Occup - [employer], [employer]   Current Outpatient Medications on File Prior to Visit  Medication Sig Dispense Refill   albuterol (VENTOLIN HFA) 108 (90 Base) MCG/ACT inhaler Inhale 2 puffs into the lungs every 6 (six) hours as needed.      Blood Glucose Monitoring Suppl (ACCU-CHEK GUIDE) w/Device KIT 1 each by Does not apply route daily. 1 kit 0   budesonide-formoterol (SYMBICORT) 160-4.5 MCG/ACT inhaler Inhale 2 puffs into the lungs 2 (two) times daily.     buPROPion (WELLBUTRIN XL) 300 MG 24 hr tablet Take 300 mg by mouth daily.     Continuous Glucose Sensor (DEXCOM G6 SENSOR) MISC Use as instructed, change every 10 days 9 each 3   Continuous Glucose Transmitter (DEXCOM G6 TRANSMITTER) MISC 1 Device by Does not apply route every 3 (three) months. 1 each 3   desvenlafaxine (PRISTIQ) 100 MG 24 hr tablet Take 100 mg by mouth daily.      fluticasone (FLONASE) 50 MCG/ACT nasal spray Place 2 sprays into both nostrils daily.      glucose blood (ACCU-CHEK GUIDE) test strip Use to check blood sugars 3  times daily 100 each 12   Insulin  Disposable Pump (OMNIPOD 5 DEXG7G6 PODS GEN 5) MISC Use every 2 days as advised 45 each 3   Insulin  Pen Needle (PEN NEEDLES) 31G X 8 MM MISC Use once every 3 days 90 each 4   insulin  regular human CONCENTRATED (HUMULIN  R U-500 KWIKPEN) 500 UNIT/ML KwikPen INJECT UP TO 300 UNITS DAILY UNDER THE SKIN AS ADVISED 60 mL 1  levocetirizine (XYZAL) 5 MG tablet Take 5 mg by mouth every evening.      lisinopril-hydrochlorothiazide (PRINZIDE,ZESTORETIC) 10-12.5 MG tablet Take 1 tablet by mouth daily.      montelukast (SINGULAIR) 5 MG chewable tablet Chew 10 mg by mouth at bedtime.      ONETOUCH DELICA LANCETS 33G MISC Use to test 3 times daily as instructed. 300 each 3   rosuvastatin (CRESTOR) 20 MG tablet Take 20 mg by mouth daily.      No current facility-administered medications on file prior to visit.   Allergies  Allergen Reactions   Amoxicillin-Pot Clavulanate Hives   Benzonatate Other (See Comments)   Canagliflozin Nausea Only   Ceftin  [Cefuroxime] Hives   Ciprofloxacin Hives   Clarithromycin Other (See Comments)   Emetrol Nausea Only   Metformin Nausea Only   Sulfa Antibiotics Hives   Family History  Problem Relation Age of Onset   Hypertension Mother    Diabetes Mother    Hyperlipidemia Mother    Parkinson's disease Mother    Hyperlipidemia Father    Diabetes Sister        TYPE I    Diabetes Maternal Grandmother    Dementia Maternal Grandfather    Heart disease Paternal Grandfather        CAD; MI   PE: BP 130/80   Pulse (!) 104   Ht 5' 4 (1.626 m)   Wt (!) 427 lb 6.4 oz (193.9 kg)   SpO2 96%   BMI 73.36 kg/m  Wt Readings from Last 3 Encounters:  10/17/23 (!) 427 lb 6.4 oz (193.9 kg)  04/19/23 (!) 409 lb (185.5 kg)  10/17/22 (!) 402 lb (182.3 kg)   Constitutional: obese, in NAD Eyes: EOMI, no exophthalmos ENT: no thyromegaly, no cervical lymphadenopathy Cardiovascular: Tachycardia, RR, No MRG Respiratory: CTA B Musculoskeletal:  no deformities Skin: no rashes Neurological: + tremor with outstretched hands  ASSESSMENT: 1. DM2, insulin -dependent, uncontrolled, with long term complications - DR  2. HL  3. Obesity class III  PLAN:  1. Patient with uncontrolled, type 2 diabetes, very insulin  resistant, on U-500 concentrated insulin , on the OmniPod insulin  pump started in 2022.  She switched to the OmniPod 5 in 04/2021.  She changes the pods every 2 rather than 3 days.  Her OmniPod integrates with the Dexcom CGM. - At last visit, sugars were fluctuating above the upper limit of the target range for the second half of the night and early morning, when they were higher in the target range.  Overall, glucose levels were increasing after every meal and remained elevated in the evening.  Upon reviewing individual daily pump downloads, it appears that she was not bolusing consistently, sometimes bolusing late, also and not entering enough carbs into the pump.  I recommended an increase in the mealtime boluses and also to try to bolus 30 to 40 minutes before meals.  Her maximum basal rate was set at 1 unit an hour and we increased this to 1.3 units an hour.  We also lowered her targets. CGM interpretation: -At today's visit, we reviewed her CGM downloads: It appears that 52% of values are in target range (goal >70%), while 48% are higher than 180 (goal <25%), and 0% are lower than 70 (goal <4%).  The calculated average blood sugar is 182.  The projected HbA1c for the next 3 months (GMI) is 7.7%. -Reviewing the CGM trends, sugars appear to have improved since last visit, now dropping to the normal range in  the second half of the night and then increasing slightly particularly after lunch and then more significantly after dinner.  Sugars after dinner are almost all elevated and remain elevated up until approximately 3 AM when they start to improve.  Upon reviewing her pump downloads, she is still entering only approximately 6 to 8 g of  carbs for each meal and we discussed about increasing the amount with some of her lunches and with all of her dinners.  We can also lower her target CBG more.  Will continue the rest of the settings.  I introduced the changes into the pump for her. -She feels that her sugars started to improve after starting Pilates 2 weeks ago.  This is good news.  She is planning to continue. - I suggested to:  Patient Instructions  Please use the following pump settings: - basal rates: 12 am: 1 unit/h 5 am: 1.15 units/h - ICR: 1:1  - coffee: 6-8 units - lunch: 8-10 units - dinner: 10-12 units - target:  6 am: 130 >> 120 - ISF: 16 - Insulin  on Board: 6h - max basal rate 1.3  Bolus 30-45 min before the meal.  Please return in 4-6 months.  - we checked her HbA1c: 7.9% (lower) - advised to check sugars at different times of the day - 4x a day, rotating check times - advised for yearly eye exams >> she is UTD - return to clinic in 4-6 months   2. HL - Reviewed latest lipid panel from 09/2023: 150/156/45/78-fractions at goal - Continues Crestor 10 without side effects.  3. Obesity class III -We unfortunately need to continue concentrated insulin , which is weight inducing - She gained 7 pounds before last visit - She gained 18 pounds since last visit - She now started Pilates, which I advised her to continue.  Lela Fendt, MD PhD [facility] End[facility]in[facility]

## 2023-10-17 NOTE — Patient Instructions (Addendum)
 Please use the following pump settings: - basal rates: 12 am: 1 unit/h 5 am: 1.15 units/h - ICR: 1:1  - coffee: 6-8 units - lunch: 8-10 units - dinner: 10-12 units - target:  6 am: 130 >> 120 - ISF: 16 - Insulin  on Board: 6h - max basal rate 1.3  Bolus 30-45 min before the meal.  Please return in 4-6 months.

## 2023-10-17 NOTE — Addendum Note (Signed)
 Addended by: CLEOTILDE ROLIN RAMAN on: 10/17/2023 11:27 AM   Modules accepted: Orders

## 2023-12-05 ENCOUNTER — Other Ambulatory Visit: Payer: Self-pay | Admitting: Internal Medicine

## 2023-12-26 ENCOUNTER — Encounter: Payer: Self-pay | Admitting: Family Medicine

## 2023-12-26 ENCOUNTER — Ambulatory Visit: Admitting: Family Medicine

## 2023-12-26 VITALS — BP 132/80 | HR 105 | Temp 98.7°F | Ht 64.0 in | Wt >= 6400 oz

## 2023-12-26 DIAGNOSIS — M2550 Pain in unspecified joint: Secondary | ICD-10-CM

## 2023-12-26 DIAGNOSIS — J4541 Moderate persistent asthma with (acute) exacerbation: Secondary | ICD-10-CM | POA: Diagnosis not present

## 2023-12-26 DIAGNOSIS — G609 Hereditary and idiopathic neuropathy, unspecified: Secondary | ICD-10-CM | POA: Diagnosis not present

## 2023-12-26 DIAGNOSIS — Z6841 Body Mass Index (BMI) 40.0 and over, adult: Secondary | ICD-10-CM

## 2023-12-26 DIAGNOSIS — E282 Polycystic ovarian syndrome: Secondary | ICD-10-CM

## 2023-12-26 DIAGNOSIS — E1165 Type 2 diabetes mellitus with hyperglycemia: Secondary | ICD-10-CM | POA: Insufficient documentation

## 2023-12-26 DIAGNOSIS — Z23 Encounter for immunization: Secondary | ICD-10-CM | POA: Diagnosis not present

## 2023-12-26 DIAGNOSIS — E66813 Obesity, class 3: Secondary | ICD-10-CM

## 2023-12-26 DIAGNOSIS — G4733 Obstructive sleep apnea (adult) (pediatric): Secondary | ICD-10-CM

## 2023-12-26 DIAGNOSIS — E782 Mixed hyperlipidemia: Secondary | ICD-10-CM

## 2023-12-26 DIAGNOSIS — J22 Unspecified acute lower respiratory infection: Secondary | ICD-10-CM | POA: Diagnosis not present

## 2023-12-26 DIAGNOSIS — R062 Wheezing: Secondary | ICD-10-CM | POA: Insufficient documentation

## 2023-12-26 DIAGNOSIS — I10 Essential (primary) hypertension: Secondary | ICD-10-CM

## 2023-12-26 MED ORDER — DOXYCYCLINE HYCLATE 100 MG PO CAPS: 100.0000 mg | ORAL_CAPSULE | Freq: Two times a day (BID) | ORAL | 0 refills | Status: AC

## 2023-12-26 MED ORDER — ALBUTEROL SULFATE (2.5 MG/3ML) 0.083% IN NEBU: 2.5000 mg | INHALATION_SOLUTION | Freq: Four times a day (QID) | RESPIRATORY_TRACT | 1 refills | Status: DC | PRN

## 2023-12-26 NOTE — Patient Instructions (Addendum)
 Welcome to [facility]!  Thank you for choosing us  for your Primary Care needs.   We offer in person and video appointments for your convenience. You may call our office to schedule appointments, or you may schedule appointments with me through MyChart.   The best way to get in contact with me is via MyChart message. This will get to me faster than a phone call, unless there is an emergency, then please call 911.  The lab is located downstairs in the Sports Medicine building, we also have xray available there.   We are checking labs today, will be in contact with any results that require further attention.  [facility] on [address], [place] and [place])  Follow-up with me for new or worsening symptoms.  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

## 2023-12-26 NOTE — Progress Notes (Signed)
 New Patient Visit  Subjective:     Patient ID: Vicki Rivera, female    DOB: 01-Aug-1975, 48 y.o.   MRN: [id number]  Chief Complaint  Patient presents with   Establish Care    Discuss menopausal symptoms, and chronic conditions    HPI  Discussed the use of AI scribe software for clinical note transcription with the patient, who gave verbal consent to proceed.  History of Present Illness Vicki Rivera is a 48 year old female with diabetes and depression who presents with worsening pain and neuropathy.  Peripheral neuropathy and upper extremity pain - Significant worsening of neuropathy and pain since February, coinciding with cessation of menses and COVID-19 infection - Severe neuropathy affecting hands and arms, with intense nerve pain interfering with daily activities - Arms and hands become very numb; concern for carpal tunnel syndrome, though symptoms may originate proximally - Muscle seizing and lack of control when walking - Chronic pain throughout body, with joint and muscle pain making ambulation difficult - Muscles seize up, causing concern for falls  Lower back pain and mobility issues - History of debilitating symptoms in 2021, initially attributed to a slipped disc - Physical therapy improved mobility, though not to baseline - Remains active with hiking and yoga - Lifting a 75-pound electric bike may have contributed to back issues  Glycemic control and diabetes management - Diabetes managed with Omnipod - Recent A1c reduced to 7.1  Sleep disturbance - History of sleep apnea, uses CPAP machine - Poor sleep quality, averaging 4-5 hours per night - Sleep frequently disrupted by pain and numbness  Lower extremity pain - Brief period of leg pain relief after using Flonase two weeks ago     ROS Per HPI  Outpatient Encounter Medications as of 12/26/2023  Medication Sig   albuterol  (PROVENTIL ) (2.5 MG/3ML) 0.083% nebulizer solution Take 3 mLs (2.5 mg total)  by nebulization every 6 (six) hours as needed for wheezing or shortness of breath.   albuterol  (VENTOLIN  HFA) 108 (90 Base) MCG/ACT inhaler Inhale 2 puffs into the lungs every 6 (six) hours as needed.    Blood Glucose Monitoring Suppl (ACCU-CHEK GUIDE) w/Device KIT 1 each by Does not apply route daily.   budesonide-formoterol (SYMBICORT) 160-4.5 MCG/ACT inhaler Inhale 2 puffs into the lungs 2 (two) times daily.   buPROPion (WELLBUTRIN XL) 300 MG 24 hr tablet Take 300 mg by mouth daily.   Continuous Glucose Sensor (DEXCOM G6 SENSOR) MISC Use as instructed, change every 10 days   Continuous Glucose Transmitter (DEXCOM G6 TRANSMITTER) MISC 1 Device by Does not apply route every 3 (three) months.   desvenlafaxine (PRISTIQ) 100 MG 24 hr tablet Take 100 mg by mouth daily.    [EXPIRED] doxycycline  (VIBRAMYCIN ) 100 MG capsule Take 1 capsule (100 mg total) by mouth 2 (two) times daily for 7 days.   fluticasone (FLONASE) 50 MCG/ACT nasal spray Place 2 sprays into both nostrils daily.    glucose blood (ACCU-CHEK GUIDE) test strip Use to check blood sugars 3 times daily   Insulin  Disposable Pump (OMNIPOD 5 DEXG7G6 PODS GEN 5) MISC Use every 2 days as advised   Insulin  Pen Needle (PEN NEEDLES) 31G X 8 MM MISC Use once every 3 days   insulin  regular human CONCENTRATED (HUMULIN  R U-500 KWIKPEN) 500 UNIT/ML KwikPen INJECT UP TO 300 UNITS DAILY UNDER THE SKIN AS ADVISED   levocetirizine (XYZAL) 5 MG tablet Take 5 mg by mouth every evening.    lisinopril-hydrochlorothiazide (PRINZIDE,ZESTORETIC) 10-12.5  MG tablet Take 1 tablet by mouth daily.    meloxicam (MOBIC) 15 MG tablet Take 15 mg by mouth daily.   montelukast (SINGULAIR) 5 MG chewable tablet Chew 10 mg by mouth at bedtime.    ONETOUCH DELICA LANCETS 33G MISC Use to test 3 times daily as instructed.   rosuvastatin (CRESTOR) 20 MG tablet Take 20 mg by mouth daily.    No facility-administered encounter medications on file as of 12/26/2023.    Past Medical  History:  Diagnosis Date   Allergy    Anxiety    Asthma    Diabetes mellitus without complication (HCC)    DM Type II    Hypertension    Insomnia    Morbid obesity (HCC)    PCOS (polycystic ovarian syndrome)    Sinusitis     Past Surgical History:  Procedure Laterality Date   CHOLECYSTECTOMY      Family History  Problem Relation Age of Onset   Hypertension Mother    Diabetes Mother    Hyperlipidemia Mother    Parkinson's disease Mother    Hyperlipidemia Father    Diabetes Sister        TYPE I    Diabetes Maternal Grandmother    Dementia Maternal Grandfather    Heart disease Paternal Grandfather        CAD; MI    Social History   Socioeconomic History   Marital status: Married    Spouse name: Not on file   Number of children: Not on file   Years of education: Not on file   Highest education level: Not on file  Occupational History   Not on file  Tobacco Use   Smoking status: Never   Smokeless tobacco: Never  Substance and Sexual Activity   Alcohol use: Yes    Alcohol/week: 0.0 standard drinks of alcohol   Drug use: Not on file   Sexual activity: Not on file  Other Topics Concern   Not on file  Social History Narrative   Single (female partner-8 yrs)   0 children   Exercise- 2 times weekly   Caffeine use - 3-4 servings daily   College Graduate - [address] [employer] ([place])    Occup - [employer], [employer]   Social Drivers of Corporate investment banker Strain: Not on Ship broker Insecurity: Not on file  Transportation Needs: Not on file  Physical Activity: Not on file  Stress: Not on file  Social Connections: Not on file  Intimate Partner Violence: Not on file       Objective:    BP 132/80 (BP Location: Left Arm, Patient Position: Sitting)   Pulse (!) 105   Temp 98.7 F (37.1 C) (Temporal)   Ht 5' 4 (1.626 m)   Wt (!) 432 lb 12.8 oz (196.3 kg)   LMP 11/01/2023 (Exact Date)   SpO2 95%   BMI  74.29 kg/m    Physical Exam Vitals and nursing note reviewed.  Constitutional:      General: She is not in acute distress.    Appearance: Normal appearance. She is obese.  HENT:     Head: Normocephalic and atraumatic.     Right Ear: External ear normal.     Left Ear: External ear normal.     Nose: Nose normal.     Mouth/Throat:     Mouth: Mucous membranes are moist.     Pharynx: Oropharynx is clear.  Eyes:  Extraocular Movements: Extraocular movements intact.     Pupils: Pupils are equal, round, and reactive to light.  Cardiovascular:     Rate and Rhythm: Normal rate and regular rhythm.     Pulses: Normal pulses.     Heart sounds: Normal heart sounds.  Pulmonary:     Effort: Pulmonary effort is normal. No respiratory distress.     Breath sounds: Wheezing (RLL) present. No rhonchi or rales.  Musculoskeletal:        General: Normal range of motion.     Cervical back: Normal range of motion.     Right lower leg: No edema.     Left lower leg: No edema.  Lymphadenopathy:     Cervical: No cervical adenopathy.  Neurological:     General: No focal deficit present.     Mental Status: She is alert and oriented to person, place, and time.  Psychiatric:        Mood and Affect: Mood normal.        Thought Content: Thought content normal.     No results found for any visits on 12/26/23.      Assessment & Plan:   Assessment and Plan Assessment & Plan Chronic pain, multiple joint pain, polyneuropathy Chronic pain exacerbated by menopause and recent COVID-19, affecting mobility. Neuropathy in hands and arms with numbness and nerve pain. Hypermobile joints may contribute to musculoskeletal pain. Differential includes carpal tunnel syndrome and other nerve compression syndromes. History of neck trauma may contribute to symptoms. - Refer to Sports Medicine for hypermobility and pain management. - Consider physical therapy with hypermobility specialists. - Discuss dry needling  for muscle tightness and nerve pressure. - Provide handicap parking permit for mobility assistance.  Moderate persistent asthma with acute exacerbation, lower respiratory infection Asthma with chronic allergic rhinitis and sinusitis. Persistent nasal drip and productive cough with brown sputum. History of antibiotic-resistant sinus infections. - Prescribe nebulizer solution twice daily. - Advise purchase of a nebulizer with a stable base. - Consider systemic steroids if symptoms persist. - Discuss purchasing a nebulizer on [employer] if insurance does not cover. - Doxycycline  100 mg 1 capsule twice a day for 10 days to pharmacy  Obstructive sleep apnea on CPAP Obstructive sleep apnea with poor sleep quality and frequent awakenings. Current CPAP may be ineffective. Previous sleep study showed significant apnea events. - Consider referral for repeat sleep study for BiPAP assessment.  Type 2 diabetes with hyperglycemia with long-term use of insulin  - Followed by endocrinology - Continue insulin  pump  Mixed hyperlipidemia - Stable, continue rosuvastatin  Primary hypertension - Controlled on lisinopril-HCTZ 10-12.5 mg once daily -Continue  Class III obesity, PCOS - Multiple weight challenges despite diet and exercise, despite diabetes treatment - Will follow with endocrinology  Immunization due - Flu vaccine given      Orders Placed This Encounter  Procedures   Flu vaccine trivalent PF, 6mos and older(Flulaval,Afluria,Fluarix,Fluzone)   Ambulatory referral to Sports Medicine    Referral Priority:   Routine    Referral Type:   Consultation    Number of Visits Requested:   1     Meds ordered this encounter  Medications   doxycycline  (VIBRAMYCIN ) 100 MG capsule    Sig: Take 1 capsule (100 mg total) by mouth 2 (two) times daily for 7 days.    Dispense:  14 capsule    Refill:  0   albuterol  (PROVENTIL ) (2.5 MG/3ML) 0.083% nebulizer solution    Sig: Take 3 mLs (2.5 mg total)  by  nebulization every 6 (six) hours as needed for wheezing or shortness of breath.    Dispense:  150 mL    Refill:  1    Return in about 4 weeks (around 01/23/2024).  Corean LITTIE Ku, FNP

## 2023-12-31 ENCOUNTER — Encounter (HOSPITAL_COMMUNITY): Payer: Self-pay | Admitting: Internal Medicine

## 2024-01-05 ENCOUNTER — Encounter: Payer: Self-pay | Admitting: Internal Medicine

## 2024-01-05 DIAGNOSIS — G4733 Obstructive sleep apnea (adult) (pediatric): Secondary | ICD-10-CM | POA: Insufficient documentation

## 2024-01-05 DIAGNOSIS — G894 Chronic pain syndrome: Secondary | ICD-10-CM | POA: Insufficient documentation

## 2024-01-05 DIAGNOSIS — G609 Hereditary and idiopathic neuropathy, unspecified: Secondary | ICD-10-CM | POA: Insufficient documentation

## 2024-01-05 DIAGNOSIS — G8929 Other chronic pain: Secondary | ICD-10-CM | POA: Insufficient documentation

## 2024-01-05 DIAGNOSIS — E1165 Type 2 diabetes mellitus with hyperglycemia: Secondary | ICD-10-CM

## 2024-01-06 MED ORDER — DEXCOM G6 SENSOR MISC: 3 refills | Status: DC

## 2024-01-07 ENCOUNTER — Ambulatory Visit (HOSPITAL_COMMUNITY): Admitting: Anesthesiology

## 2024-01-07 ENCOUNTER — Encounter (HOSPITAL_COMMUNITY): Payer: Self-pay | Admitting: Internal Medicine

## 2024-01-07 ENCOUNTER — Encounter (HOSPITAL_COMMUNITY): Admission: RE | Disposition: A | Payer: Self-pay | Source: Home / Self Care | Attending: Internal Medicine

## 2024-01-07 ENCOUNTER — Ambulatory Visit (HOSPITAL_COMMUNITY)
Admission: RE | Admit: 2024-01-07 | Discharge: 2024-01-07 | Disposition: A | Discharge status: Home / Self Care | Attending: Internal Medicine | Admitting: Internal Medicine

## 2024-01-07 DIAGNOSIS — I10 Essential (primary) hypertension: Secondary | ICD-10-CM | POA: Insufficient documentation

## 2024-01-07 DIAGNOSIS — E119 Type 2 diabetes mellitus without complications: Secondary | ICD-10-CM | POA: Insufficient documentation

## 2024-01-07 DIAGNOSIS — Z6841 Body Mass Index (BMI) 40.0 and over, adult: Secondary | ICD-10-CM | POA: Diagnosis not present

## 2024-01-07 DIAGNOSIS — Z1211 Encounter for screening for malignant neoplasm of colon: Secondary | ICD-10-CM | POA: Insufficient documentation

## 2024-01-07 DIAGNOSIS — Z794 Long term (current) use of insulin: Secondary | ICD-10-CM | POA: Diagnosis not present

## 2024-01-07 DIAGNOSIS — Z8616 Personal history of COVID-19: Secondary | ICD-10-CM | POA: Diagnosis not present

## 2024-01-07 DIAGNOSIS — G4733 Obstructive sleep apnea (adult) (pediatric): Secondary | ICD-10-CM

## 2024-01-07 DIAGNOSIS — J45909 Unspecified asthma, uncomplicated: Secondary | ICD-10-CM | POA: Diagnosis not present

## 2024-01-07 DIAGNOSIS — F419 Anxiety disorder, unspecified: Secondary | ICD-10-CM | POA: Diagnosis not present

## 2024-01-07 HISTORY — PX: COLONOSCOPY: SHX5424

## 2024-01-07 LAB — GLUCOSE, CAPILLARY: Glucose-Capillary: 117 mg/dL — ABNORMAL HIGH (ref 70–99)

## 2024-01-07 SURGERY — COLONOSCOPY
Anesthesia: Monitor Anesthesia Care

## 2024-01-07 MED ORDER — PROPOFOL 500 MG/50ML IV EMUL
INTRAVENOUS | Status: DC | PRN
  Administered 2024-01-07: 125 ug/kg/min via INTRAVENOUS

## 2024-01-07 MED ORDER — SODIUM CHLORIDE 0.9 % IV SOLN: INTRAVENOUS | Status: DC

## 2024-01-07 MED ORDER — PROPOFOL 1000 MG/100ML IV EMUL
INTRAVENOUS | Status: AC
Start: 2024-01-07 — End: 2024-01-07
  Filled 2024-01-07: qty 100

## 2024-01-07 MED ORDER — SODIUM CHLORIDE 0.9 % IV SOLN
INTRAVENOUS | Status: AC | PRN
  Administered 2024-01-07: 500 mL via INTRAMUSCULAR

## 2024-01-07 MED ORDER — PROPOFOL 10 MG/ML IV BOLUS
INTRAVENOUS | Status: DC | PRN
  Administered 2024-01-07 (×3): 20 mg via INTRAVENOUS

## 2024-01-07 MED ORDER — PROPOFOL 500 MG/50ML IV EMUL
INTRAVENOUS | Status: AC
  Filled 2024-01-07: qty 50

## 2024-01-07 MED ORDER — LIDOCAINE 2% (20 MG/ML) 5 ML SYRINGE
INTRAMUSCULAR | Status: DC | PRN
  Administered 2024-01-07: 60 mg via INTRAVENOUS

## 2024-01-07 NOTE — Anesthesia Preprocedure Evaluation (Signed)
 Anesthesia Evaluation  Patient identified by MRN, date of birth, ID band Patient awake    Reviewed: Allergy & Precautions, NPO status , Patient's Chart, lab work & pertinent test results  History of Anesthesia Complications Negative for: history of anesthetic complications  Airway Mallampati: II  TM Distance: >3 FB Neck ROM: Full    Dental no notable dental hx. (+) Teeth Intact   Pulmonary asthma , sleep apnea and Continuous Positive Airway Pressure Ventilation , neg COPD, Patient abstained from smoking.Not current smoker Covid infection two months ago with slow resolution. Asthma exacerbation almost two weeks ago, prescribed nebulizer at that time and has been using it twice a day and says it has helped. Her breathing feels at baseline today.    + decreased breath sounds      Cardiovascular Exercise Tolerance: Poor METShypertension, Pt. on medications (-) CAD and (-) Past MI (-) dysrhythmias  Rhythm:Regular Rate:Normal - Systolic murmurs    Neuro/Psych  PSYCHIATRIC DISORDERS Anxiety     negative neurological ROS     GI/Hepatic ,neg GERD  ,,(+)     (-) substance abuse    Endo/Other  diabetes, Insulin  Dependent  Class 4 obesityInsulin pump in situ, turned on.  Renal/GU negative Renal ROS     Musculoskeletal   Abdominal  (+) + obese  Peds  Hematology   Anesthesia Other Findings Past Medical History: No date: Allergy No date: Anxiety No date: Asthma No date: Diabetes mellitus without complication (HCC)     Comment:  DM Type II  No date: Hypertension No date: Insomnia No date: Morbid obesity (HCC) No date: PCOS (polycystic ovarian syndrome) No date: Sinusitis  Reproductive/Obstetrics                              Anesthesia Physical Anesthesia Plan  ASA: 3  Anesthesia Plan: MAC   Post-op Pain Management: Minimal or no pain anticipated   Induction: Intravenous  PONV Risk Score  and Plan: 2 and Propofol infusion, TIVA, Ondansetron and Treatment may vary due to age or medical condition  Airway Management Planned: Nasal Cannula and Nasal CPAP  Additional Equipment: None  Intra-op Plan:   Post-operative Plan:   Informed Consent: I have reviewed the patients History and Physical, chart, labs and discussed the procedure including the risks, benefits and alternatives for the proposed anesthesia with the patient or authorized representative who has indicated his/her understanding and acceptance.     Dental advisory given  Plan Discussed with: CRNA and Surgeon  Anesthesia Plan Comments: (Discussed risks of anesthesia with patient, including possibility of difficulty with spontaneous ventilation under anesthesia necessitating airway intervention, PONV, and rare risks such as cardiac or respiratory or neurological events, and allergic reactions. Discussed the role of CRNA in patient's perioperative care. Patient understands. Patient informed about increased incidence of above perioperative risk due to high BMI. Patient understands.  Plan for HFNC.)        Anesthesia Quick Evaluation

## 2024-01-07 NOTE — Discharge Instructions (Signed)

## 2024-01-07 NOTE — Anesthesia Postprocedure Evaluation (Signed)
 Anesthesia Post Note  Patient: Vicki Rivera  Procedure(s) Performed: COLONOSCOPY     Patient location during evaluation: PACU Anesthesia Type: MAC Level of consciousness: awake and alert Pain management: pain level controlled Vital Signs Assessment: post-procedure vital signs reviewed and stable Respiratory status: spontaneous breathing, nonlabored ventilation, respiratory function stable and patient connected to nasal cannula oxygen Cardiovascular status: stable and blood pressure returned to baseline Postop Assessment: no apparent nausea or vomiting Anesthetic complications: no   No notable events documented.  Last Vitals:  Vitals:   01/07/24 0955 01/07/24 1000  BP: (!) 142/72 131/78  Pulse: 86 85  Resp: 11 (!) 25  Temp: 36.4 C   SpO2: 100% 99%    Last Pain:  Vitals:   01/07/24 0955  TempSrc: Temporal  PainSc: 0-No pain                 Rome Ade

## 2024-01-07 NOTE — H&P (Signed)
 [facility] H&P  Subjective: Vicki Rivera is a 48 y.o. female who presents for colonoscopy. Hospital based procedure given elevated BMI. No current alarm symptoms. No family history colon cancer. Did well with prep. No chest pain or shortness of breath. Has OSA, uses CPAP.   Objective: General: Awake and alert, non-toxic in appearance Cardio: Regular rate and rhythm  Pulm: Clear to auscultation, no conversational dyspnea Abdomen: Soft, non-tender to palpation, bowel sounds appreciated    Assessment:  Colon cancer screening Morbid obesity  Plan:  -Recommend colonoscopy, procedure was discussed with patient in office including benefits, risks and alternatives, no questions currently -Further recommendations to follow pending procedure  Estefana Keas, DO [facility]

## 2024-01-07 NOTE — Transfer of Care (Signed)
 Immediate Anesthesia Transfer of Care Note  Patient: Vicki Rivera  Procedure(s) Performed: COLONOSCOPY  Patient Location: PACU  Anesthesia Type:MAC  Level of Consciousness: awake, alert , and oriented  Airway & Oxygen Therapy: Patient Spontanous Breathing and Patient connected to face mask oxygen  Post-op Assessment: Report given to RN and Post -op Vital signs reviewed and stable  Post vital signs: Reviewed and stable  Last Vitals:  Vitals Value Taken Time  BP    Temp    Pulse 86 01/07/24 09:54  Resp 11 01/07/24 09:54  SpO2 100 % 01/07/24 09:54  Vitals shown include unfiled device data.  Last Pain:  Vitals:   01/07/24 0843  TempSrc: Temporal  PainSc: 0-No pain         Complications: No notable events documented.

## 2024-01-07 NOTE — Op Note (Signed)
 [facility] Patient Name: Vicki Rivera Procedure Date: 01/07/2024 MRN: [id number] Attending MD: Estefana Keas DO, DO, [id number] Date of Birth: [birth year] CSN: [id number] Age: 48 Admit Type: Outpatient Procedure:                Colonoscopy Indications:              Screening for malignant neoplasm in the colon Providers:                Estefana Keas DO, DO, Almarie Masters, RN,                            Haskel Chris, Technician Referring MD:              Medicines:                See the Anesthesia note for documentation of the                            administered medications Complications:            No immediate complications. Estimated Blood Loss:     Estimated blood loss: none. Procedure:                Pre-Anesthesia Assessment:                           - ASA Grade Assessment: III - A patient with severe                            systemic disease.                           - The risks and benefits of the procedure and the                            sedation options and risks were discussed with the                            patient. All questions were answered and informed                            consent was obtained.                           After obtaining informed consent, the colonoscope                            was passed under direct vision. Throughout the                            procedure, the patient's blood pressure, pulse, and                            oxygen saturations were monitored continuously. The                            [id number]-[id number] ([id number]) colonoscope was introduced  through the anus and advanced to the the terminal                            ileum, with identification of the appendiceal                            orifice and IC valve. The colonoscopy was somewhat                            difficult due to significant looping. Successful                            completion of the procedure was  aided by                            straightening and shortening the scope to obtain                            bowel loop reduction and using scope torsion. The                            patient tolerated the procedure well. The quality                            of the bowel preparation was evaluated using the                            BBPS [facility] Bowel Preparation Scale) with scores                            of: Right Colon = 3, Transverse Colon = 3 and Left                            Colon = 3 (entire mucosa seen well with no residual                            staining, small fragments of stool or opaque                            liquid). The total BBPS score equals 9. The                            terminal ileum, ileocecal valve, appendiceal                            orifice, and rectum were photographed. Scope In: 9:25:29 AM Scope Out: 9:43:25 AM Scope Withdrawal Time: 0 hours 6 minutes 12 seconds  Total Procedure Duration: 0 hours 17 minutes 56 seconds  Findings:      The perianal and digital rectal examinations were normal.      There is no endoscopic evidence of diverticula or polyps in the entire       colon.      The terminal ileum appeared  normal. Impression:               - The examined portion of the ileum was normal.                           - No specimens collected. Moderate Sedation:      Monitored anesthesia care provided by anesthesia department. Recommendation:           - Discharge patient to home.                           - Resume previous diet.                           - Repeat colonoscopy in 10 years for screening                            purposes.                           - Return to my office PRN.                           - Telephone my office if symptomatic PRN. Procedure Code(s):        --- Professional ---                           [phone], Colonoscopy, flexible; diagnostic, including                            collection of specimen(s) by  brushing or washing,                            when performed (separate procedure) Diagnosis Code(s):        --- Professional ---                           Z12.11, Encounter for screening for malignant                            neoplasm of colon CPT copyright 2022 American Medical Association. All rights reserved. The codes documented in this report are preliminary and upon coder review may  be revised to meet current compliance requirements. Dr Estefana Keas, DO Estefana Keas DO, DO 01/07/2024 9:56:31 AM Number of Addenda: 0

## 2024-01-08 ENCOUNTER — Encounter (HOSPITAL_COMMUNITY): Payer: Self-pay | Admitting: Internal Medicine

## 2024-01-13 ENCOUNTER — Ambulatory Visit: Admitting: Family Medicine

## 2024-01-13 ENCOUNTER — Ambulatory Visit (INDEPENDENT_AMBULATORY_CARE_PROVIDER_SITE_OTHER)

## 2024-01-13 VITALS — BP 150/90 | HR 109 | Ht 64.0 in | Wt >= 6400 oz

## 2024-01-13 DIAGNOSIS — M25511 Pain in right shoulder: Secondary | ICD-10-CM | POA: Diagnosis not present

## 2024-01-13 DIAGNOSIS — M791 Myalgia, unspecified site: Secondary | ICD-10-CM | POA: Diagnosis not present

## 2024-01-13 DIAGNOSIS — M5416 Radiculopathy, lumbar region: Secondary | ICD-10-CM

## 2024-01-13 DIAGNOSIS — M25512 Pain in left shoulder: Secondary | ICD-10-CM

## 2024-01-13 DIAGNOSIS — M2550 Pain in unspecified joint: Secondary | ICD-10-CM

## 2024-01-13 NOTE — Progress Notes (Unsigned)
 I, Claretha Schimke am a scribe for Dr. Artist Lloyd, MD.  Vicki Rivera is a 48 y.o. female who presents to [facility] today for multiple joint pain. Hx of peripheral neuropathy, upper extremity pain, low back pain, and mobility issues.   Today, patient reports February of this year is when it started. Pain rotates from Si joint and right shoulder.  Normally very active but this pain is abnormal. Struggles to walk to the bathroom. Legs tense up while she is walking to the point where she can't walk anymore. It also happens when she is standing. Been doing yoga in the past. Tried piliates and it made it worse. Using meloxicam regularly, Voltaren gel on occasion. Pain wakes her up at night and prevents her from going to step. Having nerve pain in the arms and hands but that is getting better. Can't kneel because of the pain in the shins.    Pertinent review of systems: No fevers or chills  Relevant historical information: Similar pain in 2018    Exam:  BP (!) 150/90   Pulse (!) 109   Ht 5' 4 (1.626 m)   Wt (!) 432 lb (196 kg)   LMP  (LMP Unknown)   SpO2 98%   BMI 74.15 kg/m  General: Well Developed, well nourished, and in no acute distress.   MSK: L-spine decreased lumbar motion.  Antalgic gait.  Upper extremities normal shoulder motion.    Lab and Radiology Results  X-ray images bilateral shoulders and lumbar spine obtained today personally and independently interpreted.  Right shoulder: Mild AC DJD.  No acute fracture.  No severe glenohumeral DJD.  Left shoulder: Minimal AC DJD.  No acute fracture.  No significant glenohumeral DJD.  Lumbar spine: DDD worse at L4-5 L5-S1.  No acute fractures.  Await formal radiology review   Assessment and Plan: 48 y.o. female with chronic polyarthralgia.  Patient could have discrete musculoskeletal issues including lumbar radiculopathy shoulder impingement etc.  However a more systemic inflammatory  situation could be possible as well.  Will proceed with x-rays and rheumatologic workup today.  Additionally will refer to physical therapy.  She is a member at [facility] so aquatic physical therapy there could be helpful if needed.  Recheck in 2 months.  PDMP not reviewed this encounter. Orders Placed This Encounter  Procedures   DG Lumbar Spine 2-3 Views    Standing Status:   Future    Number of Occurrences:   1    Expiration Date:   02/13/2024    Reason for Exam (SYMPTOM  OR DIAGNOSIS REQUIRED):   low back pain    Preferred imaging location?:   [facility]    Is patient pregnant?:   No   DG Shoulder Left    Standing Status:   Future    Number of Occurrences:   1    Expiration Date:   02/13/2024    Reason for Exam (SYMPTOM  OR DIAGNOSIS REQUIRED):   left shoulder pain    Preferred imaging location?:   [facility]    Is patient pregnant?:   No   DG Shoulder Right    Standing Status:   Future    Number of Occurrences:   1    Expiration Date:   01/12/2025    Reason for Exam (SYMPTOM  OR DIAGNOSIS REQUIRED):   right shoulder pain    Preferred imaging location?:   [facility]    Is  patient pregnant?:   No   ANA    Standing Status:   Future    Number of Occurrences:   1    Expiration Date:   01/12/2025   Cyclic citrul peptide antibody, IgG    Standing Status:   Future    Number of Occurrences:   1    Expiration Date:   01/12/2025   HLA-B27 antigen    Polyarthalgia    Standing Status:   Future    Number of Occurrences:   1    Expiration Date:   01/12/2025   Sedimentation rate    Standing Status:   Future    Number of Occurrences:   1    Expiration Date:   01/12/2025   Rheumatoid factor    Standing Status:   Future    Number of Occurrences:   1    Expiration Date:   01/12/2025   Ambulatory referral to Physical Therapy    Referral Priority:   Routine    Referral Type:   Physical Medicine    Referral Reason:   Specialty Services Required     Requested Specialty:   Physical Therapy    Number of Visits Requested:   1   No orders of the defined types were placed in this encounter.    Discussed warning signs or symptoms. Please see discharge instructions. Patient expresses understanding.   The above documentation has been reviewed and is accurate and complete Artist Lloyd, M.D.

## 2024-01-13 NOTE — Patient Instructions (Addendum)
 Thank you for coming in today.  Please get an Xray and lab today before you leave   A referral for physical therapy has been submitted. A representative from the physical therapy office will contact you to coordinate scheduling after confirming your benefits with your insurance provider. If you do not hear from the physical therapy office within the next 1-2 weeks, please let us  know.   See you back in 2 months.

## 2024-01-14 ENCOUNTER — Ambulatory Visit: Payer: Self-pay | Admitting: Family Medicine

## 2024-01-14 LAB — SEDIMENTATION RATE: Sed Rate: 22 mm/h (ref 0–30)

## 2024-01-14 NOTE — Progress Notes (Signed)
 Right shoulder x-ray showed just a little bit of arthritis in the small joint at the top of the shoulder.

## 2024-01-14 NOTE — Progress Notes (Signed)
 Left shoulder x-ray shows no arthritis.  Left shoulder x-ray looks okay.

## 2024-01-15 LAB — RHEUMATOID FACTOR: Rheumatoid fact SerPl-aCnc: <10 [IU]/mL (ref <14)

## 2024-01-15 LAB — CYCLIC CITRUL PEPTIDE ANTIBODY, IGG: Cyclic Citrullin Peptide Ab: <16 U

## 2024-01-15 LAB — ANA: Anti Nuclear Antibody (ANA): NEGATIVE

## 2024-01-15 LAB — HLA-B27 ANTIGEN: HLA-B27 Antigen: NEGATIVE

## 2024-01-16 NOTE — Progress Notes (Signed)
 Rheumatology labs are normal.  Recommend proceeding with physical therapy.

## 2024-01-17 NOTE — Progress Notes (Signed)
 Low back x-ray shows some mild arthritis changes.

## 2024-01-23 ENCOUNTER — Other Ambulatory Visit: Payer: Self-pay | Admitting: Family Medicine

## 2024-01-23 DIAGNOSIS — Z1231 Encounter for screening mammogram for malignant neoplasm of breast: Secondary | ICD-10-CM

## 2024-01-27 ENCOUNTER — Ambulatory Visit: Admitting: Family Medicine

## 2024-01-27 VITALS — BP 124/78 | HR 93 | Temp 98.1°F | Ht 64.0 in | Wt >= 6400 oz

## 2024-01-27 DIAGNOSIS — R1319 Other dysphagia: Secondary | ICD-10-CM

## 2024-01-27 DIAGNOSIS — R09A2 Foreign body sensation, throat: Secondary | ICD-10-CM

## 2024-01-27 DIAGNOSIS — E282 Polycystic ovarian syndrome: Secondary | ICD-10-CM

## 2024-01-27 DIAGNOSIS — Z794 Long term (current) use of insulin: Secondary | ICD-10-CM

## 2024-01-27 DIAGNOSIS — E119 Type 2 diabetes mellitus without complications: Secondary | ICD-10-CM | POA: Diagnosis not present

## 2024-01-27 DIAGNOSIS — J4540 Moderate persistent asthma, uncomplicated: Secondary | ICD-10-CM

## 2024-01-27 DIAGNOSIS — Z79899 Other long term (current) drug therapy: Secondary | ICD-10-CM | POA: Insufficient documentation

## 2024-01-27 DIAGNOSIS — R053 Chronic cough: Secondary | ICD-10-CM | POA: Diagnosis not present

## 2024-01-27 DIAGNOSIS — Z6841 Body Mass Index (BMI) 40.0 and over, adult: Secondary | ICD-10-CM

## 2024-01-27 DIAGNOSIS — G894 Chronic pain syndrome: Secondary | ICD-10-CM | POA: Diagnosis not present

## 2024-01-27 DIAGNOSIS — E782 Mixed hyperlipidemia: Secondary | ICD-10-CM

## 2024-01-27 DIAGNOSIS — G4733 Obstructive sleep apnea (adult) (pediatric): Secondary | ICD-10-CM

## 2024-01-27 DIAGNOSIS — I10 Essential (primary) hypertension: Secondary | ICD-10-CM

## 2024-01-27 MED ORDER — AIRSUPRA 90-80 MCG/ACT IN AERO: 2.0000 | INHALATION_SPRAY | RESPIRATORY_TRACT | 3 refills | Status: AC | PRN

## 2024-01-27 NOTE — Progress Notes (Signed)
 Acute Office Visit  Subjective:     Patient ID: Vicki Rivera, female    DOB: 09-20-1975, 48 y.o.   MRN: [id number]  Chief Complaint  Patient presents with   Follow-up    HPI  Discussed the use of AI scribe software for clinical note transcription with the patient, who gave verbal consent to proceed.  History of Present Illness Vicki Rivera is a 48 year old female with chronic pain and asthma who presents with worsening pain and respiratory symptoms.  Chronic musculoskeletal pain - Chronic pain worsens during colder weather, with significant discomfort in the fall - Physical therapy not yet initiated due to scheduling issues; earliest availability in November  Asthma and respiratory symptoms - Wheezing, especially in the lower lung lobes - Requires nebulizer treatments after work - Uses albuterol  inhaler - Trial of Symbicort without improvement in symptoms - Suspects workplace environment may exacerbate respiratory symptoms  Gastrointestinal symptoms and mast cell activation - Random episodes of nausea, not related to food intake or blood glucose levels - Nausea can occur at various times, including after showers - Difficulty swallowing larger pills due to strong gag reflex  Dietary restrictions and allergies - Follows a vegetarian diet - Avoids gluten, certain wheat, and corn due to allergies  Medication use - Takes Pepcid and Zyrtec or Xyzal twice daily  Hyperlipidemia - Hyperlipidemia well-controlled with medication     ROS Per HPI      Objective:    BP 124/78 (BP Location: Left Arm, Patient Position: Sitting)   Pulse 93   Temp 98.1 F (36.7 C) (Temporal)   Ht 5' 4 (1.626 m)   Wt (!) 438 lb (198.7 kg)   LMP 11/13/2023 (Approximate)   SpO2 93%   BMI 75.18 kg/m    Physical Exam Vitals and nursing note reviewed.  Constitutional:      General: She is not in acute distress.    Appearance: Normal appearance. She is obese.  HENT:     Head:  Normocephalic and atraumatic.     Right Ear: External ear normal.     Left Ear: External ear normal.     Nose: Nose normal.     Mouth/Throat:     Mouth: Mucous membranes are moist.     Pharynx: Oropharynx is clear.  Eyes:     Extraocular Movements: Extraocular movements intact.     Pupils: Pupils are equal, round, and reactive to light.  Cardiovascular:     Rate and Rhythm: Normal rate and regular rhythm.     Pulses: Normal pulses.     Heart sounds: Normal heart sounds.  Pulmonary:     Effort: Pulmonary effort is normal. No respiratory distress.     Breath sounds: Wheezing (Mild diffuse) present. No rhonchi or rales.  Musculoskeletal:        General: Normal range of motion.     Cervical back: Normal range of motion.     Right lower leg: No edema.     Left lower leg: No edema.  Lymphadenopathy:     Cervical: No cervical adenopathy.  Neurological:     General: No focal deficit present.     Mental Status: She is alert and oriented to person, place, and time.  Psychiatric:        Mood and Affect: Mood normal.        Thought Content: Thought content normal.     No results found for any visits on 01/27/24.  Assessment & Plan:   Assessment and Plan Assessment & Plan Moderate persistent asthma without complication, chronic cough  asthma symptoms persist with wheezing and need for nebulizer treatments, particularly in the fall due to allergies. Current treatment includes albuterol  and Symbicort, but symptoms are not fully controlled. - Prescribe Airsupra inhaler for combined albuterol  and budesonide use. - Use incentive spirometer before nebulizer treatments to improve lung function. - Continue using nebulizer as needed, up to three times a day.  Chronic pain syndrome  including fibromyalgia and multiple joint pain Chronic pain and polyneuropathy persist, with worsening symptoms in colder weather. Pain management strategies are being explored. - Continue physical therapy  at [facility]. - Schedule acupuncture appointment with Feliciano in November. - Use Tylenol 1000 mg as needed for pain, not exceeding 3000 mg per day.  Insulin -dependent type 2 diabetes mellitus Insulin  supply is running low, with refill not due until next week. She is managing insulin  use frugally and has contacted Dr. Wilburt for an early refill. - Contact Dr. Vianne for early insulin  refill.  Dysphagia, globus sensation Possible esophageal stricture and evaluation of esophageal symptoms Symptoms suggestive of esophageal stricture, including nausea and gag reflex issues. Family history of esophageal issues noted. - Refer to GI for evaluation of possible esophageal stricture. - Consider endoscopy to assess esophageal structure.  BMI 75 - Continue efforts in healthy diet and activity level  Primary hypertension -Controlled, CBC, CMP today - Continue lisinopril hydrochlorothiazide  OSA on CPAP - Stable, continue CPAP  Mixed hyperlipidemia -Lipids today, continue rosuvastatin  PCOS - Continue to follow-up with endocrinology Will also check thyroid to rule out this as a cause of endocrine disruption  Medication management -Labs today, will dose adjust medications as indicated     Orders Placed This Encounter  Procedures   CBC with Differential/Platelet    Release to patient:   Immediate [1]   Comprehensive metabolic panel with GFR    Release to patient:   Immediate [1]   TSH   Thyroid Profile   Vitamin B12   Vitamin D (25 hydroxy)     Meds ordered this encounter  Medications   Albuterol -Budesonide (AIRSUPRA) 90-80 MCG/ACT AERO    Sig: Inhale 2 puffs into the lungs every 4 (four) hours as needed.    Dispense:  5.9 g    Refill:  3    Return in about 3 months (around 04/28/2024) for meds OV.  Corean LITTIE Ku, FNP

## 2024-01-27 NOTE — Patient Instructions (Addendum)
 Incentive spirometer as needed  Airsupra 2 puffs every 4 hours   1000mg  tylenol before getting toe nails clipped  Continue with current medication regimen  Get back in with GI for eval of esophagus and possible stricture  We are checking labs today, will be in contact with any results that require further attention  Follow up with me in about 3 months for labs and medication management, sooner if needed.

## 2024-02-02 DIAGNOSIS — E119 Type 2 diabetes mellitus without complications: Secondary | ICD-10-CM | POA: Insufficient documentation

## 2024-02-09 ENCOUNTER — Encounter: Payer: Self-pay | Admitting: Internal Medicine

## 2024-02-10 ENCOUNTER — Other Ambulatory Visit: Payer: Self-pay

## 2024-02-10 DIAGNOSIS — E1165 Type 2 diabetes mellitus with hyperglycemia: Secondary | ICD-10-CM

## 2024-02-10 MED ORDER — HUMULIN R U-500 KWIKPEN 500 UNIT/ML ~~LOC~~ SOPN: PEN_INJECTOR | SUBCUTANEOUS | 1 refills | Status: AC

## 2024-02-11 ENCOUNTER — Ambulatory Visit
Admission: RE | Admit: 2024-02-11 | Discharge: 2024-02-11 | Disposition: A | Discharge status: Home / Self Care | Source: Ambulatory Visit | Attending: Family Medicine | Admitting: Family Medicine

## 2024-02-11 DIAGNOSIS — Z1231 Encounter for screening mammogram for malignant neoplasm of breast: Secondary | ICD-10-CM

## 2024-02-17 ENCOUNTER — Ambulatory Visit: Payer: Self-pay | Admitting: Family Medicine

## 2024-02-25 ENCOUNTER — Encounter (HOSPITAL_BASED_OUTPATIENT_CLINIC_OR_DEPARTMENT_OTHER): Payer: Self-pay | Admitting: Physical Therapy

## 2024-02-25 ENCOUNTER — Ambulatory Visit (HOSPITAL_BASED_OUTPATIENT_CLINIC_OR_DEPARTMENT_OTHER): Attending: Family Medicine | Admitting: Physical Therapy

## 2024-02-25 ENCOUNTER — Other Ambulatory Visit: Payer: Self-pay

## 2024-02-25 DIAGNOSIS — M791 Myalgia, unspecified site: Secondary | ICD-10-CM | POA: Insufficient documentation

## 2024-02-25 DIAGNOSIS — M79604 Pain in right leg: Secondary | ICD-10-CM | POA: Insufficient documentation

## 2024-02-25 DIAGNOSIS — M5416 Radiculopathy, lumbar region: Secondary | ICD-10-CM | POA: Diagnosis not present

## 2024-02-25 DIAGNOSIS — M25512 Pain in left shoulder: Secondary | ICD-10-CM | POA: Diagnosis present

## 2024-02-25 DIAGNOSIS — M25511 Pain in right shoulder: Secondary | ICD-10-CM | POA: Diagnosis present

## 2024-02-25 DIAGNOSIS — M79605 Pain in left leg: Secondary | ICD-10-CM | POA: Diagnosis present

## 2024-02-25 DIAGNOSIS — R2689 Other abnormalities of gait and mobility: Secondary | ICD-10-CM | POA: Diagnosis present

## 2024-02-25 DIAGNOSIS — G8929 Other chronic pain: Secondary | ICD-10-CM | POA: Insufficient documentation

## 2024-02-25 DIAGNOSIS — M2550 Pain in unspecified joint: Secondary | ICD-10-CM | POA: Insufficient documentation

## 2024-02-25 DIAGNOSIS — M5459 Other low back pain: Secondary | ICD-10-CM | POA: Diagnosis present

## 2024-02-25 NOTE — Therapy (Signed)
 OUTPATIENT PHYSICAL THERAPY LOWER EXTREMITY EVALUATION   Patient Name: Vicki Rivera MRN: [id number] DOB:[birth year], 48 y.o., female Today's Date: 02/25/2024  END OF SESSION:  PT End of Session - 02/25/24 1521     Visit Number 1    Date for Recertification  04/24/24    Authorization Type aetna    PT Start Time 1315    PT Stop Time 1355    PT Time Calculation (min) 40 min    Activity Tolerance Patient tolerated treatment well    Behavior During Therapy [facility] for tasks assessed/performed          Past Medical History:  Diagnosis Date   Allergy    Anxiety    Asthma    Diabetes mellitus without complication (HCC)    DM Type II    Hypertension    Insomnia    Morbid obesity (HCC)    PCOS (polycystic ovarian syndrome)    Sinusitis    Past Surgical History:  Procedure Laterality Date   CHOLECYSTECTOMY     COLONOSCOPY N/A 01/07/2024   Procedure: COLONOSCOPY;  Surgeon: Kriss Estefana DEL, DO;  Location: [facility];  Service: Gastroenterology;  Laterality: N/A;   Patient Active Problem List   Diagnosis Date Noted   Insulin  dependent type 2 diabetes mellitus (HCC) 02/02/2024   Other dysphagia 01/27/2024   Globus sensation 01/27/2024   Long-term use of high-risk medication 01/27/2024   Chronic cough 01/27/2024   Chronic pain syndrome 01/05/2024   Idiopathic peripheral neuropathy 01/05/2024   OSA on CPAP 01/05/2024   Multiple joint pain 12/26/2023   Type 2 diabetes mellitus with hyperglycemia, without long-term current use of insulin  (HCC) 12/26/2023   Immunization due 12/26/2023   Lower respiratory infection 12/26/2023   Wheezing 12/26/2023   PCOS (polycystic ovarian syndrome)    Morbid obesity (HCC)    Diabetes mellitus without complication (HCC)    Hypertension    Asthma    Hyperlipidemia 07/15/2017   Class 3 severe obesity with serious comorbidity and body mass index (BMI) of 50.0 to 59.9 in adult [facility]) 01/11/2017   Uncontrolled type 2 diabetes mellitus with  hyperglycemia, with long-term current use of insulin  (HCC) 06/07/2015    PCP: Corean Ku MD  REFERRING PROVIDER: Artist Lloyd MD  REFERRING DIAG:  Other LBP Chronic pain of lower extremity, bilateral Chronic pain both shoulders Other abnormalities of gait and mobility  THERAPY DIAG:  Other low back pain  Chronic pain of lower extremity, bilateral  Chronic pain of both shoulders  Other abnormalities of gait and mobility  Rationale for Evaluation and Treatment: Rehabilitation  ONSET DATE: years/chronic  SUBJECTIVE:   SUBJECTIVE STATEMENT: I started some kind of flare back in FEB 2025.  I had a flare back in 2019-2020.  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  I do yoga maybe 1 x week which helps for a short while. I am in school and don't have a lot of time.  Am more stiff with less pain as day progresseds more movement  more pain  Acupuncture helps the overall pain'. Have massages  PERTINENT HISTORY: Evaluate and Treat for polyarthralgia  1-2 times per week for 4-6 weeks.   Decrease pain, increase strength, flexibility, function, and range of motion.  Modalities may include, traction, ionto, phono, stim, and dry needling prn. PAIN:  Are you having pain? Yes: NPRS scale: current 3-4/10; worst 8/10; least 3-4/10 Pain location: shoulders, cervical spine arms/hands; hips LB and legs  Pain description: stabbing pain, bugs crawling Aggravating factors: life Relieving factors: resting, slow movement; sometime Yoga  PRECAUTIONS: None  RED FLAGS: None   WEIGHT BEARING RESTRICTIONS: No  FALLS:  Has patient fallen in last 6 months? Yes. Number of falls 1 tripped on box  LIVING ENVIRONMENT: Lives with: lives with their spouse and small animals Lives in: House/apartment Stairs: yes Has following equipment at home: None  OCCUPATION: librarian  PLOF: Independent  PATIENT GOALS: relieve pain, be mobile  NEXT MD  VISIT: weeks  OBJECTIVE:  Note: Objective measures were completed at Evaluation unless otherwise noted.  DIAGNOSTIC FINDINGS: Lab and Radiology Results   Right shoulder: Mild AC DJD.  No acute fracture.  No severe glenohumeral DJD.   Left shoulder: Minimal AC DJD.  No acute fracture.  No significant glenohumeral DJD.   Lumbar spine: DDD worse at L4-5 L5-S1.  No acute fractures.  PATIENT SURVEYS:  LEFS: 24/80  COGNITION: Overall cognitive status: Within functional limits for tasks assessed     SENSATION: WFL    POSTURE: rounded shoulders and forward head  PALPATION: Unable due to body habitus  LOWER EXTREMITY ROM:  Wfl. Some limitations due to body habitus  LOWER EXTREMITY MMT:  MMT Right eval Left eval  Hip flexion 4+ 4+  Hip extension    Hip abduction    Hip adduction    Hip internal rotation    Hip external rotation    Knee flexion    Knee extension 5 5  Ankle dorsiflexion    Ankle plantarflexion    Ankle inversion    Ankle eversion     (Blank rows = not tested)   FUNCTIONAL TESTS:  Timed up and go (TUG): 12.50 s  GAIT: Distance walked: 500 ft Assistive device utilized: None Level of assistance: Complete Independence Comments: increased lateral displacement                                                                                                                                 TREATMENT  Eval Self care:Posture and optometrist instruction   PATIENT EDUCATION:  Education details: Discussed eval findings, rehab rationale, aquatic program progression/POC and pools in area. Patient is in agreement  Person educated: Patient Education method: Explanation Education comprehension: verbalized understanding  HOME EXERCISE PROGRAM: Aquatic TBA  ASSESSMENT:  CLINICAL IMPRESSION: Patient is a 48 y.o. f who was seen today for physical therapy evaluation and treatment for polyarthralgia. This has been a chronic condition for the past few  years with at least 2 flairs.  MD states it is likely she has a potential systemic inflammatory situation rather than discrete musculoskeletal issues. She reports limitation to standing and walking limited by tightness and pain throughout posterior hips and legs. She is unable to keep up her prior active lifestyle and does not tolerate exercise (Pilates/ minimal Yoga). Her goals are to reduce pain and improve overall function. She is a member here  at [facility] but has not attended in a long while.  She is familiar with the [facility] [employer] water classes and wants to try to return.  She will benefit from skilled aquatic PT with plan to improve pain/management, increase exercise and activity toleration and improve QOL  OBJECTIVE IMPAIRMENTS: Abnormal gait, cardiopulmonary status limiting activity, decreased activity tolerance, decreased balance, decreased endurance, decreased mobility, difficulty walking, decreased ROM, decreased strength, impaired flexibility, postural dysfunction, obesity, and pain.   ACTIVITY LIMITATIONS: carrying, lifting, bending, sitting, standing, squatting, stairs, transfers, and locomotion level  PARTICIPATION LIMITATIONS: meal prep, cleaning, shopping, community activity, and occupation  PERSONAL FACTORS: Past/current experiences, Time since onset of injury/illness/exacerbation, and 3+ comorbidities: see PmHx are also affecting patient's functional outcome.   REHAB POTENTIAL: Good  CLINICAL DECISION MAKING: Stable/uncomplicated  EVALUATION COMPLEXITY: Low   GOALS: Goals reviewed with patient? Yes  SHORT TERM GOALS: Target date: 03/21/24 Pt will tolerate full aquatic sessions consistently without increase in pain and with improving function to demonstrate good toleration and effectiveness of intervention.  Baseline: Goal status: INITIAL  2.  Pt will return to [facility] [employer] water classes a [facility] Baseline:  Goal status: INITIAL  3.  Pt will report  reduction of general pain while submerged by 50% to demonstrate water properties and effect on pain. Baseline:  Goal status: INITIAL   LONG TERM GOALS: Target date: 04/30/24  Pt to improve on LEFS by at least 9 point to demonstrate statistically significant Improvement in function. Baseline: 24/80 Goal status: INITIAL  2.  Pt will tolerate standing/walking up to 20 minutes without limitation due to hip Baseline:  Goal status: INITIAL  3.  Pt will be indep with final HEP's (land and aquatic as appropriate) for continued management of condition Baseline:  Goal status: INITIAL  4.  Pt will report decrease in pain by at least 30% (overall) for improved toleration to activity/quality of life and to demonstrate improved management of pain. Baseline:  Goal status: INITIAL     PLAN:  PT FREQUENCY: 1-2x/week  PT DURATION: 8 weeks extended due to holidays  PLANNED INTERVENTIONS: 97164- PT Re-evaluation, 97750- Physical Performance Testing, 97110-Therapeutic exercises, 97530- Therapeutic activity, [id number]- Neuromuscular re-education, 97535- Self Care, 02859- Manual therapy, [id number]- Gait training, [phone]- Aquatic Therapy, [phone] (1-2 muscles), [id number] (3+ muscles)- Dry Needling, Patient/Family education, Balance training, Stair training, Taping, Joint mobilization, DME instructions, Cryotherapy, and Moist heat  PLAN FOR NEXT SESSION: aquatics only: general ROM/stretching and strengthening all extremities and core. Pain management.  Return to aquatic exercises (indep or classes); pain management   Ronal Foots) Val Schiavo MPT 02/25/24 5:32 PM [facility] [address] [place], [place], [address] Phone: [phone]   Fax:  [phone]

## 2024-02-27 ENCOUNTER — Other Ambulatory Visit: Payer: Self-pay | Admitting: Internal Medicine

## 2024-02-27 DIAGNOSIS — E1165 Type 2 diabetes mellitus with hyperglycemia: Secondary | ICD-10-CM

## 2024-03-03 ENCOUNTER — Ambulatory Visit (HOSPITAL_BASED_OUTPATIENT_CLINIC_OR_DEPARTMENT_OTHER): Admitting: Physical Therapy

## 2024-03-03 ENCOUNTER — Encounter (HOSPITAL_BASED_OUTPATIENT_CLINIC_OR_DEPARTMENT_OTHER): Payer: Self-pay | Admitting: Physical Therapy

## 2024-03-03 DIAGNOSIS — M79604 Pain in right leg: Secondary | ICD-10-CM | POA: Diagnosis present

## 2024-03-03 DIAGNOSIS — M79605 Pain in left leg: Secondary | ICD-10-CM | POA: Diagnosis present

## 2024-03-03 DIAGNOSIS — M25512 Pain in left shoulder: Secondary | ICD-10-CM | POA: Insufficient documentation

## 2024-03-03 DIAGNOSIS — M25511 Pain in right shoulder: Secondary | ICD-10-CM | POA: Insufficient documentation

## 2024-03-03 DIAGNOSIS — R2689 Other abnormalities of gait and mobility: Secondary | ICD-10-CM | POA: Diagnosis present

## 2024-03-03 DIAGNOSIS — M5459 Other low back pain: Secondary | ICD-10-CM | POA: Insufficient documentation

## 2024-03-03 DIAGNOSIS — G8929 Other chronic pain: Secondary | ICD-10-CM | POA: Diagnosis present

## 2024-03-03 NOTE — Therapy (Signed)
 OUTPATIENT PHYSICAL THERAPY LOWER EXTREMITY TREATMENT   Patient Name: Vicki Rivera MRN: [id number] DOB:20-Jun-1975, 48 y.o., female Today's Date: 03/03/2024  END OF SESSION:  PT End of Session - 03/03/24 1103     Visit Number 2    Date for Recertification  04/24/24    Authorization Type aetna    PT Start Time 1101    PT Stop Time 1140    PT Time Calculation (min) 39 min    Activity Tolerance Patient tolerated treatment well    Behavior During Therapy WFL for tasks assessed/performed           Past Medical History:  Diagnosis Date   Allergy    Anxiety    Asthma    Diabetes mellitus without complication (HCC)    DM Type II    Hypertension    Insomnia    Morbid obesity (HCC)    PCOS (polycystic ovarian syndrome)    Sinusitis    Past Surgical History:  Procedure Laterality Date   CHOLECYSTECTOMY     COLONOSCOPY N/A 01/07/2024   Procedure: COLONOSCOPY;  Surgeon: Kriss Estefana DEL, DO;  Location: [facility];  Service: Gastroenterology;  Laterality: N/A;   Patient Active Problem List   Diagnosis Date Noted   Insulin  dependent type 2 diabetes mellitus (HCC) 02/02/2024   Other dysphagia 01/27/2024   Globus sensation 01/27/2024   Long-term use of high-risk medication 01/27/2024   Chronic cough 01/27/2024   Chronic pain syndrome 01/05/2024   Idiopathic peripheral neuropathy 01/05/2024   OSA on CPAP 01/05/2024   Multiple joint pain 12/26/2023   Type 2 diabetes mellitus with hyperglycemia, without long-term current use of insulin  (HCC) 12/26/2023   Immunization due 12/26/2023   Lower respiratory infection 12/26/2023   Wheezing 12/26/2023   PCOS (polycystic ovarian syndrome)    Morbid obesity (HCC)    Diabetes mellitus without complication (HCC)    Hypertension    Asthma    Hyperlipidemia 07/15/2017   Class 3 severe obesity with serious comorbidity and body mass index (BMI) of 50.0 to 59.9 in adult [facility]) 01/11/2017   Uncontrolled type 2 diabetes mellitus with  hyperglycemia, with long-term current use of insulin  (HCC) 06/07/2015    PCP: Corean Ku MD  REFERRING PROVIDER: Artist Lloyd MD  REFERRING DIAG:  Other LBP Chronic pain of lower extremity, bilateral Chronic pain both shoulders Other abnormalities of gait and mobility  THERAPY DIAG:  Other low back pain  Chronic pain of lower extremity, bilateral  Chronic pain of both shoulders  Other abnormalities of gait and mobility  Rationale for Evaluation and Treatment: Rehabilitation  ONSET DATE: years/chronic  SUBJECTIVE:   SUBJECTIVE STATEMENT:  No changes. Pain this am 4/10  Initial Subjective I started some kind of flare back in FEB 2025.  I had a flare back in 2019-2020.  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  I do yoga maybe 1 x week which helps for a short while. I am in school and don't have a lot of time.  Am more stiff with less pain as day progresses more movement  more pain  Acupuncture helps the overall pain'. Have massages  PERTINENT HISTORY: Evaluate and Treat for polyarthralgia  1-2 times per week for 4-6 weeks.   Decrease pain, increase strength, flexibility, function, and range of motion.  Modalities may include, traction, ionto, phono, stim, and dry needling prn. PAIN:  Are you having pain? Yes: NPRS scale: current 3-4/10; worst 8/10; least  3-4/10 Pain location: shoulders, cervical spine arms/hands; hips LB and legs Pain description: stabbing pain, bugs crawling Aggravating factors: life Relieving factors: resting, slow movement; sometime Yoga  PRECAUTIONS: None  RED FLAGS: None   WEIGHT BEARING RESTRICTIONS: No  FALLS:  Has patient fallen in last 6 months? Yes. Number of falls 1 tripped on box  LIVING ENVIRONMENT: Lives with: lives with their spouse and small animals Lives in: House/apartment Stairs: yes Has following equipment at home: None  OCCUPATION: librarian  PLOF:  Independent  PATIENT GOALS: relieve pain, be mobile  NEXT MD VISIT: weeks  OBJECTIVE:  Note: Objective measures were completed at Evaluation unless otherwise noted.  DIAGNOSTIC FINDINGS: Lab and Radiology Results   Right shoulder: Mild AC DJD.  No acute fracture.  No severe glenohumeral DJD.   Left shoulder: Minimal AC DJD.  No acute fracture.  No significant glenohumeral DJD.   Lumbar spine: DDD worse at L4-5 L5-S1.  No acute fractures.  PATIENT SURVEYS:  LEFS: 24/80  COGNITION: Overall cognitive status: Within functional limits for tasks assessed     SENSATION: WFL    POSTURE: rounded shoulders and forward head  PALPATION: Unable due to body habitus  LOWER EXTREMITY ROM:  Wfl. Some limitations due to body habitus  LOWER EXTREMITY MMT:  MMT Right eval Left eval  Hip flexion 4+ 4+  Hip extension    Hip abduction    Hip adduction    Hip internal rotation    Hip external rotation    Knee flexion    Knee extension 5 5  Ankle dorsiflexion    Ankle plantarflexion    Ankle inversion    Ankle eversion     (Blank rows = not tested)   FUNCTIONAL TESTS:  Timed up and go (TUG): 12.50 s  GAIT: Distance walked: 500 ft Assistive device utilized: None Level of assistance: Complete Independence Comments: increased lateral displacement                                                                                                                                 TREATMENT  [facility] Adult PT Treatment:                                                DATE: 03/03/24 Pt seen for aquatic therapy today.  Treatment took place in water 3.5-4.75 ft in depth at the [facility] pool. Temp of water was 91.  Pt entered/exited the pool via stairs using step to pattern with hand rail.  *Intro to setting *walking forward, back and side stepping in 3.6 ft with unsupported *ue support yellow hand buoys forward/backward marching *side stepping ue horizontal add/abd RBHB->  RBHB shoulder add/abd *solid noodle pull down wide stance then staggered x 10 *decompression position with white noodle wrapped anteriorly: hip flex/ext;  LAQ  Pt requires the buoyancy and hydrostatic pressure of water for support, and to offload joints by unweighting joint load by at least 50 % in navel deep water and by at least 75-80% in chest to neck deep water.  Viscosity of the water is needed for resistance of strengthening. Water current perturbations provides challenge to standing balance requiring increased core activation.      PATIENT EDUCATION:  Education details: Discussed eval findings, rehab rationale, aquatic program progression/POC and pools in area. Patient is in agreement  Person educated: Patient Education method: Explanation Education comprehension: verbalized understanding  HOME EXERCISE PROGRAM: Aquatic TBA  ASSESSMENT:  CLINICAL IMPRESSION: Pt demonstrates safety and independence in aquatic setting with therapist instructing from deck. She is confident in setting, moving throughout all depths easily.  Pt is directed through various movement patterns and trials in both suspended vertical and standing positions.   Pt is provided VC and demonstration throughout session for execution of exercises while monitoring toleration. She does have an episode where her lt knee pops out while side stepping. It remains sore throughout remaining session. Avoided lateral movements. Pt reports fatigue approx 35 minutes into session. Pt advised to rest after session and be prepared for fatigue increasing. She is a good candidate for aquatic intervention and will benefit from the properties of water to progress towards functional goals.    Initial Impression Patient is a 48 y.o. f who was seen today for physical therapy evaluation and treatment for polyarthralgia. This has been a chronic condition for the past few years with at least 2 flairs.  MD states it is likely she has a  potential systemic inflammatory situation rather than discrete musculoskeletal issues. She reports limitation to standing and walking limited by tightness and pain throughout posterior hips and legs. She is unable to keep up her prior active lifestyle and does not tolerate exercise (Pilates/ minimal Yoga). Her goals are to reduce pain and improve overall function. She is a member here at [employer] but has not attended in a long while.  She is familiar with the [facility] [employer] water classes and wants to try to return.  She will benefit from skilled aquatic PT with plan to improve pain/management, increase exercise and activity toleration and improve QOL  OBJECTIVE IMPAIRMENTS: Abnormal gait, cardiopulmonary status limiting activity, decreased activity tolerance, decreased balance, decreased endurance, decreased mobility, difficulty walking, decreased ROM, decreased strength, impaired flexibility, postural dysfunction, obesity, and pain.   ACTIVITY LIMITATIONS: carrying, lifting, bending, sitting, standing, squatting, stairs, transfers, and locomotion level  PARTICIPATION LIMITATIONS: meal prep, cleaning, shopping, community activity, and occupation  PERSONAL FACTORS: Past/current experiences, Time since onset of injury/illness/exacerbation, and 3+ comorbidities: see PmHx are also affecting patient's functional outcome.   REHAB POTENTIAL: Good  CLINICAL DECISION MAKING: Stable/uncomplicated  EVALUATION COMPLEXITY: Low   GOALS: Goals reviewed with patient? Yes  SHORT TERM GOALS: Target date: 03/21/24 Pt will tolerate full aquatic sessions consistently without increase in pain and with improving function to demonstrate good toleration and effectiveness of intervention.  Baseline: Goal status: INITIAL  2.  Pt will return to [facility] [employer] water classes a [facility] Baseline:  Goal status: INITIAL  3.  Pt will report reduction of general pain while submerged by 50% to demonstrate  water properties and effect on pain. Baseline:  Goal status: INITIAL   LONG TERM GOALS: Target date: 04/30/24  Pt to improve on LEFS by at least 9 point to demonstrate statistically significant Improvement in function. Baseline:  24/80 Goal status: INITIAL  2.  Pt will tolerate standing/walking up to 20 minutes without limitation due to hip Baseline:  Goal status: INITIAL  3.  Pt will be indep with final HEP's (land and aquatic as appropriate) for continued management of condition Baseline:  Goal status: INITIAL  4.  Pt will report decrease in pain by at least 30% (overall) for improved toleration to activity/quality of life and to demonstrate improved management of pain. Baseline:  Goal status: INITIAL     PLAN:  PT FREQUENCY: 1-2x/week  PT DURATION: 8 weeks extended due to holidays  PLANNED INTERVENTIONS: 97164- PT Re-evaluation, 97750- Physical Performance Testing, 97110-Therapeutic exercises, 97530- Therapeutic activity, [id number]- Neuromuscular re-education, 97535- Self Care, 02859- Manual therapy, [id number]- Gait training, [phone]- Aquatic Therapy, [phone] (1-2 muscles), 20561 (3+ muscles)- Dry Needling, Patient/Family education, Balance training, Stair training, Taping, Joint mobilization, DME instructions, Cryotherapy, and Moist heat  PLAN FOR NEXT SESSION: aquatics only: general ROM/stretching and strengthening all extremities and core. Pain management.  Return to aquatic exercises (indep or classes); pain management   Ronal Foots) Alica Shellhammer MPT 03/03/24 11:05 AM [facility] [address] [place], [place], [address] Phone: [phone]   Fax:  [phone]

## 2024-03-05 ENCOUNTER — Ambulatory Visit (HOSPITAL_BASED_OUTPATIENT_CLINIC_OR_DEPARTMENT_OTHER): Admitting: Physical Therapy

## 2024-03-05 ENCOUNTER — Encounter (HOSPITAL_BASED_OUTPATIENT_CLINIC_OR_DEPARTMENT_OTHER): Payer: Self-pay | Admitting: Physical Therapy

## 2024-03-05 DIAGNOSIS — M5459 Other low back pain: Secondary | ICD-10-CM | POA: Diagnosis not present

## 2024-03-05 DIAGNOSIS — G8929 Other chronic pain: Secondary | ICD-10-CM

## 2024-03-05 NOTE — Therapy (Signed)
 OUTPATIENT PHYSICAL THERAPY LOWER EXTREMITY TREATMENT   Patient Name: Vicki Rivera MRN: [id number] DOB:01-18-1976, 48 y.o., female Today's Date: 03/05/2024  END OF SESSION:  PT End of Session - 03/05/24 1618     Visit Number 3    Date for Recertification  04/24/24    Authorization Type aetna    PT Start Time 1616    PT Stop Time 1655    PT Time Calculation (min) 39 min    Activity Tolerance Patient tolerated treatment well    Behavior During Therapy WFL for tasks assessed/performed           Past Medical History:  Diagnosis Date   Allergy    Anxiety    Asthma    Diabetes mellitus without complication (HCC)    DM Type II    Hypertension    Insomnia    Morbid obesity (HCC)    PCOS (polycystic ovarian syndrome)    Sinusitis    Past Surgical History:  Procedure Laterality Date   CHOLECYSTECTOMY     COLONOSCOPY N/A 01/07/2024   Procedure: COLONOSCOPY;  Surgeon: Kriss Estefana DEL, DO;  Location: [facility];  Service: Gastroenterology;  Laterality: N/A;   Patient Active Problem List   Diagnosis Date Noted   Insulin  dependent type 2 diabetes mellitus (HCC) 02/02/2024   Other dysphagia 01/27/2024   Globus sensation 01/27/2024   Long-term use of high-risk medication 01/27/2024   Chronic cough 01/27/2024   Chronic pain syndrome 01/05/2024   Idiopathic peripheral neuropathy 01/05/2024   OSA on CPAP 01/05/2024   Multiple joint pain 12/26/2023   Type 2 diabetes mellitus with hyperglycemia, without long-term current use of insulin  (HCC) 12/26/2023   Immunization due 12/26/2023   Lower respiratory infection 12/26/2023   Wheezing 12/26/2023   PCOS (polycystic ovarian syndrome)    Morbid obesity (HCC)    Diabetes mellitus without complication (HCC)    Hypertension    Asthma    Hyperlipidemia 07/15/2017   Class 3 severe obesity with serious comorbidity and body mass index (BMI) of 50.0 to 59.9 in adult [facility]) 01/11/2017   Uncontrolled type 2 diabetes mellitus with  hyperglycemia, with long-term current use of insulin  (HCC) 06/07/2015    PCP: Corean Ku MD  REFERRING PROVIDER: Artist Lloyd MD  REFERRING DIAG:  Other LBP Chronic pain of lower extremity, bilateral Chronic pain both shoulders Other abnormalities of gait and mobility  THERAPY DIAG:  Other low back pain  Chronic pain of lower extremity, bilateral  Chronic pain of both shoulders  Rationale for Evaluation and Treatment: Rehabilitation  ONSET DATE: years/chronic  SUBJECTIVE:   SUBJECTIVE STATEMENT:  Worked all day and I am tired.  Pain 4/10. Felt pretty good after last session.  Initial Subjective I started some kind of flare back in FEB 2025.  I had a flare back in 2019-2020.  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  I do yoga maybe 1 x week which helps for a short while. I am in school and don't have a lot of time.  Am more stiff with less pain as day progresses more movement  more pain  Acupuncture helps the overall pain'. Have massages  PERTINENT HISTORY: Evaluate and Treat for polyarthralgia  1-2 times per week for 4-6 weeks.   Decrease pain, increase strength, flexibility, function, and range of motion.  Modalities may include, traction, ionto, phono, stim, and dry needling prn. PAIN:  Are you having pain? Yes: NPRS scale: current 3-4/10;  worst 8/10; least 3-4/10 Pain location: shoulders, cervical spine arms/hands; hips LB and legs Pain description: stabbing pain, bugs crawling Aggravating factors: life Relieving factors: resting, slow movement; sometime Yoga  PRECAUTIONS: None  RED FLAGS: None   WEIGHT BEARING RESTRICTIONS: No  FALLS:  Has patient fallen in last 6 months? Yes. Number of falls 1 tripped on box  LIVING ENVIRONMENT: Lives with: lives with their spouse and small animals Lives in: House/apartment Stairs: yes Has following equipment at home: None  OCCUPATION: librarian  PLOF:  Independent  PATIENT GOALS: relieve pain, be mobile  NEXT MD VISIT: weeks  OBJECTIVE:  Note: Objective measures were completed at Evaluation unless otherwise noted.  DIAGNOSTIC FINDINGS: Lab and Radiology Results   Right shoulder: Mild AC DJD.  No acute fracture.  No severe glenohumeral DJD.   Left shoulder: Minimal AC DJD.  No acute fracture.  No significant glenohumeral DJD.   Lumbar spine: DDD worse at L4-5 L5-S1.  No acute fractures.  PATIENT SURVEYS:  LEFS: 24/80  COGNITION: Overall cognitive status: Within functional limits for tasks assessed     SENSATION: WFL    POSTURE: rounded shoulders and forward head  PALPATION: Unable due to body habitus  LOWER EXTREMITY ROM:  Wfl. Some limitations due to body habitus  LOWER EXTREMITY MMT:  MMT Right eval Left eval  Hip flexion 4+ 4+  Hip extension    Hip abduction    Hip adduction    Hip internal rotation    Hip external rotation    Knee flexion    Knee extension 5 5  Ankle dorsiflexion    Ankle plantarflexion    Ankle inversion    Ankle eversion     (Blank rows = not tested)   FUNCTIONAL TESTS:  Timed up and go (TUG): 12.50 s  GAIT: Distance walked: 500 ft Assistive device utilized: None Level of assistance: Complete Independence Comments: increased lateral displacement                                                                                                                                 TREATMENT  [facility] Adult PT Treatment:                                                DATE: 03/05/24 Pt seen for aquatic therapy today.  Treatment took place in water 3.5-4.75 ft in depth at the [facility] pool. Temp of water was 91.  Pt entered/exited the pool via stairs using step to pattern with hand rail.   *walking forward, back and side stepping in 4.0 ft with unsupported *unsupported forward/backward marching with kick (forward) *suspended prone using yellow noodle: hip abd/add; hip  flex  -squatted rest period *yellow noodle pull down wide stance then staggered x 10  -squatted rest period *standing  horizontal add/abd; [place] *decompression position with white noodle wrapped anteriorly: hip flex/ext; LAQ  Pt requires the buoyancy and hydrostatic pressure of water for support, and to offload joints by unweighting joint load by at least 50 % in navel deep water and by at least 75-80% in chest to neck deep water.  Viscosity of the water is needed for resistance of strengthening. Water current perturbations provides challenge to standing balance requiring increased core activation.      PATIENT EDUCATION:  Education details: Discussed eval findings, rehab rationale, aquatic program progression/POC and pools in area. Patient is in agreement  Person educated: Patient Education method: Explanation Education comprehension: verbalized understanding  HOME EXERCISE PROGRAM: Aquatic TBA  ASSESSMENT:  CLINICAL IMPRESSION: Pt reports good response from last session.  Knee felt stiff but she believes she was able to stand longer and negotiate a few steps better post session. She reports today after working full day fairly high fatigue (8/10). Avoided lateral movement as pt reports with initial amb submerged that knee had increased in discomfort.  She required multiple rest periods due to pain sensitivity. Cuing for reduced speed with exercise to reduce water resistance which may cause accelerated fatigue. Overall good toleration. Will assess post toleration pain and fatigue level next session. Goals ongoing       Initial Impression Patient is a 48 y.o. f who was seen today for physical therapy evaluation and treatment for polyarthralgia. This has been a chronic condition for the past few years with at least 2 flairs.  MD states it is likely she has a potential systemic inflammatory situation rather than discrete musculoskeletal issues. She reports limitation to standing  and walking limited by tightness and pain throughout posterior hips and legs. She is unable to keep up her prior active lifestyle and does not tolerate exercise (Pilates/ minimal Yoga). Her goals are to reduce pain and improve overall function. She is a member here at [facility] but has not attended in a long while.  She is familiar with the [facility] [employer] water classes and wants to try to return.  She will benefit from skilled aquatic PT with plan to improve pain/management, increase exercise and activity toleration and improve QOL  OBJECTIVE IMPAIRMENTS: Abnormal gait, cardiopulmonary status limiting activity, decreased activity tolerance, decreased balance, decreased endurance, decreased mobility, difficulty walking, decreased ROM, decreased strength, impaired flexibility, postural dysfunction, obesity, and pain.   ACTIVITY LIMITATIONS: carrying, lifting, bending, sitting, standing, squatting, stairs, transfers, and locomotion level  PARTICIPATION LIMITATIONS: meal prep, cleaning, shopping, community activity, and occupation  PERSONAL FACTORS: Past/current experiences, Time since onset of injury/illness/exacerbation, and 3+ comorbidities: see PmHx are also affecting patient's functional outcome.   REHAB POTENTIAL: Good  CLINICAL DECISION MAKING: Stable/uncomplicated  EVALUATION COMPLEXITY: Low   GOALS: Goals reviewed with patient? Yes  SHORT TERM GOALS: Target date: 03/21/24 Pt will tolerate full aquatic sessions consistently without increase in pain and with improving function to demonstrate good toleration and effectiveness of intervention.  Baseline: Goal status: INITIAL  2.  Pt will return to [facility] [employer] water classes a [facility] Baseline:  Goal status: INITIAL  3.  Pt will report reduction of general pain while submerged by 50% to demonstrate water properties and effect on pain. Baseline:  Goal status: INITIAL   LONG TERM GOALS: Target date: 04/30/24  Pt to  improve on LEFS by at least 9 point to demonstrate statistically significant Improvement in function. Baseline: 24/80 Goal status: INITIAL  2.  Pt will  tolerate standing/walking up to 20 minutes without limitation due to hip Baseline:  Goal status: INITIAL  3.  Pt will be indep with final HEP's (land and aquatic as appropriate) for continued management of condition Baseline:  Goal status: INITIAL  4.  Pt will report decrease in pain by at least 30% (overall) for improved toleration to activity/quality of life and to demonstrate improved management of pain. Baseline:  Goal status: INITIAL     PLAN:  PT FREQUENCY: 1-2x/week  PT DURATION: 8 weeks extended due to holidays  PLANNED INTERVENTIONS: 97164- PT Re-evaluation, 97750- Physical Performance Testing, 97110-Therapeutic exercises, 97530- Therapeutic activity, [id number]- Neuromuscular re-education, 97535- Self Care, 02859- Manual therapy, [id number]- Gait training, [phone]- Aquatic Therapy, [phone] (1-2 muscles), [id number] (3+ muscles)- Dry Needling, Patient/Family education, Balance training, Stair training, Taping, Joint mobilization, DME instructions, Cryotherapy, and Moist heat  PLAN FOR NEXT SESSION: aquatics only: general ROM/stretching and strengthening all extremities and core. Pain management.  Return to aquatic exercises (indep or classes); pain management   Ronal Foots) Tawnia Schirm MPT 03/05/24 4:25 PM [facility] [address] [place], [place], [address] Phone: [phone]   Fax:  [phone]

## 2024-03-10 ENCOUNTER — Encounter (HOSPITAL_BASED_OUTPATIENT_CLINIC_OR_DEPARTMENT_OTHER): Payer: Self-pay | Admitting: Physical Therapy

## 2024-03-10 ENCOUNTER — Ambulatory Visit (HOSPITAL_BASED_OUTPATIENT_CLINIC_OR_DEPARTMENT_OTHER): Admitting: Physical Therapy

## 2024-03-10 DIAGNOSIS — R2689 Other abnormalities of gait and mobility: Secondary | ICD-10-CM

## 2024-03-10 DIAGNOSIS — G8929 Other chronic pain: Secondary | ICD-10-CM

## 2024-03-10 DIAGNOSIS — M5459 Other low back pain: Secondary | ICD-10-CM

## 2024-03-10 NOTE — Therapy (Signed)
 OUTPATIENT PHYSICAL THERAPY LOWER EXTREMITY TREATMENT   Patient Name: Vicki Rivera MRN: [id number] DOB:06-Feb-1976, 48 y.o., female Today's Date: 03/10/2024  END OF SESSION:  PT End of Session - 03/10/24 1543     Visit Number 4    Date for Recertification  04/24/24    Authorization Type aetna    PT Start Time 1545   arrives late   PT Stop Time 1615    PT Time Calculation (min) 30 min    Activity Tolerance Patient tolerated treatment well    Behavior During Therapy [facility] for tasks assessed/performed            Past Medical History:  Diagnosis Date   Allergy    Anxiety    Asthma    Diabetes mellitus without complication (HCC)    DM Type II    Hypertension    Insomnia    Morbid obesity (HCC)    PCOS (polycystic ovarian syndrome)    Sinusitis    Past Surgical History:  Procedure Laterality Date   CHOLECYSTECTOMY     COLONOSCOPY N/A 01/07/2024   Procedure: COLONOSCOPY;  Surgeon: Kriss Estefana DEL, DO;  Location: [facility];  Service: Gastroenterology;  Laterality: N/A;   Patient Active Problem List   Diagnosis Date Noted   Insulin  dependent type 2 diabetes mellitus (HCC) 02/02/2024   Other dysphagia 01/27/2024   Globus sensation 01/27/2024   Long-term use of high-risk medication 01/27/2024   Chronic cough 01/27/2024   Chronic pain syndrome 01/05/2024   Idiopathic peripheral neuropathy 01/05/2024   OSA on CPAP 01/05/2024   Multiple joint pain 12/26/2023   Type 2 diabetes mellitus with hyperglycemia, without long-term current use of insulin  (HCC) 12/26/2023   Immunization due 12/26/2023   Lower respiratory infection 12/26/2023   Wheezing 12/26/2023   PCOS (polycystic ovarian syndrome)    Morbid obesity (HCC)    Diabetes mellitus without complication (HCC)    Hypertension    Asthma    Hyperlipidemia 07/15/2017   Class 3 severe obesity with serious comorbidity and body mass index (BMI) of 50.0 to 59.9 in adult [facility]) 01/11/2017   Uncontrolled type 2 diabetes  mellitus with hyperglycemia, with long-term current use of insulin  (HCC) 06/07/2015    PCP: Corean Ku MD  REFERRING PROVIDER: Artist Lloyd MD  REFERRING DIAG:  Other LBP Chronic pain of lower extremity, bilateral Chronic pain both shoulders Other abnormalities of gait and mobility  THERAPY DIAG:  Other low back pain  Chronic pain of lower extremity, bilateral  Chronic pain of both shoulders  Other abnormalities of gait and mobility  Rationale for Evaluation and Treatment: Rehabilitation  ONSET DATE: years/chronic  SUBJECTIVE:   SUBJECTIVE STATEMENT:  I was hurting really bad after last session had to rest for 30 minutes before I could leave. I think I should only come 1 x a week  Initial Subjective I started some kind of flare back in FEB 2025.  I had a flare back in 2019-2020.  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  I do yoga maybe 1 x week which helps for a short while. I am in school and don't have a lot of time.  Am more stiff with less pain as day progresses more movement  more pain  Acupuncture helps the overall pain'. Have massages  PERTINENT HISTORY: Evaluate and Treat for polyarthralgia  1-2 times per week for 4-6 weeks.   Decrease pain, increase strength, flexibility, function, and range of motion.  Modalities may include, traction, ionto, phono, stim, and dry needling prn. PAIN:  Are you having pain? Yes: NPRS scale: current 3-4/10; worst 8/10; least 3-4/10 Pain location: shoulders, cervical spine arms/hands; hips LB and legs Pain description: stabbing pain, bugs crawling Aggravating factors: life Relieving factors: resting, slow movement; sometime Yoga  PRECAUTIONS: None  RED FLAGS: None   WEIGHT BEARING RESTRICTIONS: No  FALLS:  Has patient fallen in last 6 months? Yes. Number of falls 1 tripped on box  LIVING ENVIRONMENT: Lives with: lives with their spouse and small animals Lives  in: House/apartment Stairs: yes Has following equipment at home: None  OCCUPATION: librarian  PLOF: Independent  PATIENT GOALS: relieve pain, be mobile  NEXT MD VISIT: weeks  OBJECTIVE:  Note: Objective measures were completed at Evaluation unless otherwise noted.  DIAGNOSTIC FINDINGS: Lab and Radiology Results   Right shoulder: Mild AC DJD.  No acute fracture.  No severe glenohumeral DJD.   Left shoulder: Minimal AC DJD.  No acute fracture.  No significant glenohumeral DJD.   Lumbar spine: DDD worse at L4-5 L5-S1.  No acute fractures.  PATIENT SURVEYS:  LEFS: 24/80  COGNITION: Overall cognitive status: Within functional limits for tasks assessed     SENSATION: WFL    POSTURE: rounded shoulders and forward head  PALPATION: Unable due to body habitus  LOWER EXTREMITY ROM:  Wfl. Some limitations due to body habitus  LOWER EXTREMITY MMT:  MMT Right eval Left eval  Hip flexion 4+ 4+  Hip extension    Hip abduction    Hip adduction    Hip internal rotation    Hip external rotation    Knee flexion    Knee extension 5 5  Ankle dorsiflexion    Ankle plantarflexion    Ankle inversion    Ankle eversion     (Blank rows = not tested)   FUNCTIONAL TESTS:  Timed up and go (TUG): 12.50 s  GAIT: Distance walked: 500 ft Assistive device utilized: None Level of assistance: Complete Independence Comments: increased lateral displacement                                                                                                                                 TREATMENT  [facility] Adult PT Treatment:                                                DATE: 03/10/24 Pt seen for aquatic therapy today.  Treatment took place in water 3.5-4.75 ft in depth at the [facility] pool. Temp of water was 91.  Pt entered/exited the pool via stairs using step to pattern with hand rail.   *walking forward, back and side stepping in 4.0 ft with unsupported *unsupported  forward/backward marching with kick (forward) *white noodle suspended prone->vertical using white  noodle: hip abd/add; hip flex  -squatted rest period *standing horizontal add/abd; [place] *white noodle wrapped posteriorly:cycling  Pt requires the buoyancy and hydrostatic pressure of water for support, and to offload joints by unweighting joint load by at least 50 % in navel deep water and by at least 75-80% in chest to neck deep water.  Viscosity of the water is needed for resistance of strengthening. Water current perturbations provides challenge to standing balance requiring increased core activation.      PATIENT EDUCATION:  Education details: Discussed eval findings, rehab rationale, aquatic program progression/POC and pools in area. Patient is in agreement  Person educated: Patient Education method: Explanation Education comprehension: verbalized understanding  HOME EXERCISE PROGRAM: Aquatic TBA  ASSESSMENT:  CLINICAL IMPRESSION: Pt reports high fatigue and discomfort after last session.  She had worked all day then participated in therapy.  She has decided to reduce her frequency to 1 x week going forward for the next couple weeks for improved toleration.  Pt reports having weekly fibro massages for the next few weeks. She arrives late today inadvertently as she thought session started 15 mins later than scheduled.  She requires a seated rest period as she is OOB. Dialed session back regardless of late start due to response from last session as pt has worked today before arriving.  She tolerates well with frequent rest periods. Does report 1 episode of right knee popping when pivoting to turn. VC for mindfulness of movement to protect against.  Goals ongoing         Initial Impression Patient is a 48 y.o. f who was seen today for physical therapy evaluation and treatment for polyarthralgia. This has been a chronic condition for the past few years with at least 2 flairs.   MD states it is likely she has a potential systemic inflammatory situation rather than discrete musculoskeletal issues. She reports limitation to standing and walking limited by tightness and pain throughout posterior hips and legs. She is unable to keep up her prior active lifestyle and does not tolerate exercise (Pilates/ minimal Yoga). Her goals are to reduce pain and improve overall function. She is a member here at [facility] but has not attended in a long while.  She is familiar with the [facility] [employer] water classes and wants to try to return.  She will benefit from skilled aquatic PT with plan to improve pain/management, increase exercise and activity toleration and improve QOL  OBJECTIVE IMPAIRMENTS: Abnormal gait, cardiopulmonary status limiting activity, decreased activity tolerance, decreased balance, decreased endurance, decreased mobility, difficulty walking, decreased ROM, decreased strength, impaired flexibility, postural dysfunction, obesity, and pain.   ACTIVITY LIMITATIONS: carrying, lifting, bending, sitting, standing, squatting, stairs, transfers, and locomotion level  PARTICIPATION LIMITATIONS: meal prep, cleaning, shopping, community activity, and occupation  PERSONAL FACTORS: Past/current experiences, Time since onset of injury/illness/exacerbation, and 3+ comorbidities: see PmHx are also affecting patient's functional outcome.   REHAB POTENTIAL: Good  CLINICAL DECISION MAKING: Stable/uncomplicated  EVALUATION COMPLEXITY: Low   GOALS: Goals reviewed with patient? Yes  SHORT TERM GOALS: Target date: 03/21/24 Pt will tolerate full aquatic sessions consistently without increase in pain and with improving function to demonstrate good toleration and effectiveness of intervention.  Baseline: Goal status: INITIAL  2.  Pt will return to [facility] [employer] water classes a [facility] Baseline:  Goal status: INITIAL  3.  Pt will report reduction of general pain while  submerged by 50% to demonstrate water properties and effect on pain.  Baseline:  Goal status: INITIAL   LONG TERM GOALS: Target date: 04/30/24  Pt to improve on LEFS by at least 9 point to demonstrate statistically significant Improvement in function. Baseline: 24/80 Goal status: INITIAL  2.  Pt will tolerate standing/walking up to 20 minutes without limitation due to hip Baseline:  Goal status: INITIAL  3.  Pt will be indep with final HEP's (land and aquatic as appropriate) for continued management of condition Baseline:  Goal status: INITIAL  4.  Pt will report decrease in pain by at least 30% (overall) for improved toleration to activity/quality of life and to demonstrate improved management of pain. Baseline:  Goal status: INITIAL     PLAN:  PT FREQUENCY: 1-2x/week  PT DURATION: 8 weeks extended due to holidays  PLANNED INTERVENTIONS: 97164- PT Re-evaluation, 97750- Physical Performance Testing, 97110-Therapeutic exercises, 97530- Therapeutic activity, [id number]- Neuromuscular re-education, 97535- Self Care, 02859- Manual therapy, [id number]- Gait training, [phone]- Aquatic Therapy, [phone] (1-2 muscles), 20561 (3+ muscles)- Dry Needling, Patient/Family education, Balance training, Stair training, Taping, Joint mobilization, DME instructions, Cryotherapy, and Moist heat  PLAN FOR NEXT SESSION: aquatics only: general ROM/stretching and strengthening all extremities and core. Pain management.  Return to aquatic exercises (indep or classes); pain management   Ronal Foots) Aubreigh Fuerte MPT 03/10/24 5:19 PM [facility] [address] [place], [place], [address] Phone: [phone]   Fax:  [phone]

## 2024-03-12 ENCOUNTER — Ambulatory Visit (HOSPITAL_BASED_OUTPATIENT_CLINIC_OR_DEPARTMENT_OTHER): Payer: Self-pay | Admitting: Physical Therapy

## 2024-03-15 ENCOUNTER — Encounter: Payer: Self-pay | Admitting: Family Medicine

## 2024-03-16 ENCOUNTER — Ambulatory Visit: Admitting: Family Medicine

## 2024-03-16 ENCOUNTER — Other Ambulatory Visit: Payer: Self-pay | Admitting: Family Medicine

## 2024-03-16 DIAGNOSIS — M2550 Pain in unspecified joint: Secondary | ICD-10-CM

## 2024-03-17 ENCOUNTER — Encounter (HOSPITAL_BASED_OUTPATIENT_CLINIC_OR_DEPARTMENT_OTHER): Payer: Self-pay | Admitting: Physical Therapy

## 2024-03-17 ENCOUNTER — Ambulatory Visit (HOSPITAL_BASED_OUTPATIENT_CLINIC_OR_DEPARTMENT_OTHER): Admitting: Physical Therapy

## 2024-03-17 DIAGNOSIS — M5459 Other low back pain: Secondary | ICD-10-CM

## 2024-03-17 DIAGNOSIS — R2689 Other abnormalities of gait and mobility: Secondary | ICD-10-CM

## 2024-03-17 DIAGNOSIS — G8929 Other chronic pain: Secondary | ICD-10-CM

## 2024-03-17 NOTE — Therapy (Signed)
 OUTPATIENT PHYSICAL THERAPY LOWER EXTREMITY TREATMENT   Patient Name: Vicki Rivera MRN: [id number] DOB:Feb 20, 1976, 48 y.o., female Today's Date: 03/17/2024  END OF SESSION:  PT End of Session - 03/17/24 1610     Visit Number 5    Date for Recertification  04/24/24    Authorization Type aetna    PT Start Time 1610    PT Stop Time 1650    PT Time Calculation (min) 40 min    Activity Tolerance Patient tolerated treatment well    Behavior During Therapy [facility] for tasks assessed/performed            Past Medical History:  Diagnosis Date   Allergy    Anxiety    Asthma    Diabetes mellitus without complication (HCC)    DM Type II    Hypertension    Insomnia    Morbid obesity (HCC)    PCOS (polycystic ovarian syndrome)    Sinusitis    Past Surgical History:  Procedure Laterality Date   CHOLECYSTECTOMY     COLONOSCOPY N/A 01/07/2024   Procedure: COLONOSCOPY;  Surgeon: Kriss Estefana DEL, DO;  Location: [facility];  Service: Gastroenterology;  Laterality: N/A;   Patient Active Problem List   Diagnosis Date Noted   Insulin  dependent type 2 diabetes mellitus (HCC) 02/02/2024   Other dysphagia 01/27/2024   Globus sensation 01/27/2024   Long-term use of high-risk medication 01/27/2024   Chronic cough 01/27/2024   Chronic pain syndrome 01/05/2024   Idiopathic peripheral neuropathy 01/05/2024   OSA on CPAP 01/05/2024   Multiple joint pain 12/26/2023   Type 2 diabetes mellitus with hyperglycemia, without long-term current use of insulin  (HCC) 12/26/2023   Immunization due 12/26/2023   Lower respiratory infection 12/26/2023   Wheezing 12/26/2023   PCOS (polycystic ovarian syndrome)    Morbid obesity (HCC)    Diabetes mellitus without complication (HCC)    Hypertension    Asthma    Hyperlipidemia 07/15/2017   Class 3 severe obesity with serious comorbidity and body mass index (BMI) of 50.0 to 59.9 in adult [facility]) 01/11/2017   Uncontrolled type 2 diabetes mellitus with  hyperglycemia, with long-term current use of insulin  (HCC) 06/07/2015    PCP: Corean Ku MD  REFERRING PROVIDER: Artist Lloyd MD  REFERRING DIAG:  Other LBP Chronic pain of lower extremity, bilateral Chronic pain both shoulders Other abnormalities of gait and mobility  THERAPY DIAG:  Other low back pain  Chronic pain of lower extremity, bilateral  Chronic pain of both shoulders  Other abnormalities of gait and mobility  Rationale for Evaluation and Treatment: Rehabilitation  ONSET DATE: years/chronic  SUBJECTIVE:   SUBJECTIVE STATEMENT:  I took day off I felt I needed the rest. Pain 3/10 fatigue 9/10. I think I do better with just 1 therapy session a week.   Initial Subjective I started some kind of flare back in FEB 2025.  I had a flare back in 2019-2020.  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  I do yoga maybe 1 x week which helps for a short while. I am in school and don't have a lot of time.  Am more stiff with less pain as day progresses more movement  more pain  Acupuncture helps the overall pain'. Have massages  PERTINENT HISTORY: Evaluate and Treat for polyarthralgia  1-2 times per week for 4-6 weeks.   Decrease pain, increase strength, flexibility, function, and range of motion.  Modalities may include,  traction, ionto, phono, stim, and dry needling prn. PAIN:  Are you having pain? Yes: NPRS scale: current 3-4/10; worst 8/10; least 3-4/10 Pain location: shoulders, cervical spine arms/hands; hips LB and legs Pain description: stabbing pain, bugs crawling Aggravating factors: life Relieving factors: resting, slow movement; sometime Yoga  PRECAUTIONS: None  RED FLAGS: None   WEIGHT BEARING RESTRICTIONS: No  FALLS:  Has patient fallen in last 6 months? Yes. Number of falls 1 tripped on box  LIVING ENVIRONMENT: Lives with: lives with their spouse and small animals Lives in:  House/apartment Stairs: yes Has following equipment at home: None  OCCUPATION: librarian  PLOF: Independent  PATIENT GOALS: relieve pain, be mobile  NEXT MD VISIT: weeks  OBJECTIVE:  Note: Objective measures were completed at Evaluation unless otherwise noted.  DIAGNOSTIC FINDINGS: Lab and Radiology Results   Right shoulder: Mild AC DJD.  No acute fracture.  No severe glenohumeral DJD.   Left shoulder: Minimal AC DJD.  No acute fracture.  No significant glenohumeral DJD.   Lumbar spine: DDD worse at L4-5 L5-S1.  No acute fractures.  PATIENT SURVEYS:  LEFS: 24/80  COGNITION: Overall cognitive status: Within functional limits for tasks assessed     SENSATION: WFL    POSTURE: rounded shoulders and forward head  PALPATION: Unable due to body habitus  LOWER EXTREMITY ROM:  Wfl. Some limitations due to body habitus  LOWER EXTREMITY MMT:  MMT Right eval Left eval  Hip flexion 4+ 4+  Hip extension    Hip abduction    Hip adduction    Hip internal rotation    Hip external rotation    Knee flexion    Knee extension 5 5  Ankle dorsiflexion    Ankle plantarflexion    Ankle inversion    Ankle eversion     (Blank rows = not tested)   FUNCTIONAL TESTS:  Timed up and go (TUG): 12.50 s  GAIT: Distance walked: 500 ft Assistive device utilized: None Level of assistance: Complete Independence Comments: increased lateral displacement                                                                                                                                 TREATMENT  [facility] Adult PT Treatment:                                                DATE: 03/17/24 Pt seen for aquatic therapy today.  Treatment took place in water 3.5-4.75 ft in depth at the [facility] pool. Temp of water was 91.  Pt entered/exited the pool via stairs using step to pattern with hand rail.   *walking forward, back and side stepping in 4.0 ft with unsupported *standing  horizontal add/abd; [place] *white noodle suspended prone->vertical using white noodle: hip abd/add;  hip flex *big yellow noodle pull down for TrA engagement *supported by yellow noodle forward/backward marching with kick (forward) *white noodle wrapped posteriorly:cycling  - squatted rest periods throughout session as pt deems necessary.  Pt requires the buoyancy and hydrostatic pressure of water for support, and to offload joints by unweighting joint load by at least 50 % in navel deep water and by at least 75-80% in chest to neck deep water.  Viscosity of the water is needed for resistance of strengthening. Water current perturbations provides challenge to standing balance requiring increased core activation.      PATIENT EDUCATION:  Education details: Discussed eval findings, rehab rationale, aquatic program progression/POC and pools in area. Patient is in agreement  Person educated: Patient Education method: Explanation Education comprehension: verbalized understanding  HOME EXERCISE PROGRAM: Aquatic TBA  ASSESSMENT:  CLINICAL IMPRESSION: Pt well rested today as she was off from work.  She reports low pain but high fatigue.  She does tolerate session with improvement as compared to last session when she came after work. No increase in pain and no added fatigue (as per pt as session ended). Will assess overall toleration next session. Goals ongoing     Initial Impression Patient is a 48 y.o. f who was seen today for physical therapy evaluation and treatment for polyarthralgia. This has been a chronic condition for the past few years with at least 2 flairs.  MD states it is likely she has a potential systemic inflammatory situation rather than discrete musculoskeletal issues. She reports limitation to standing and walking limited by tightness and pain throughout posterior hips and legs. She is unable to keep up her prior active lifestyle and does not tolerate exercise  (Pilates/ minimal Yoga). Her goals are to reduce pain and improve overall function. She is a member here at [facility] but has not attended in a long while.  She is familiar with the [facility] water classes and wants to try to return.  She will benefit from skilled aquatic PT with plan to improve pain/management, increase exercise and activity toleration and improve QOL  OBJECTIVE IMPAIRMENTS: Abnormal gait, cardiopulmonary status limiting activity, decreased activity tolerance, decreased balance, decreased endurance, decreased mobility, difficulty walking, decreased ROM, decreased strength, impaired flexibility, postural dysfunction, obesity, and pain.   ACTIVITY LIMITATIONS: carrying, lifting, bending, sitting, standing, squatting, stairs, transfers, and locomotion level  PARTICIPATION LIMITATIONS: meal prep, cleaning, shopping, community activity, and occupation  PERSONAL FACTORS: Past/current experiences, Time since onset of injury/illness/exacerbation, and 3+ comorbidities: see PmHx are also affecting patient's functional outcome.   REHAB POTENTIAL: Good  CLINICAL DECISION MAKING: Stable/uncomplicated  EVALUATION COMPLEXITY: Low   GOALS: Goals reviewed with patient? Yes  SHORT TERM GOALS: Target date: 03/21/24 Pt will tolerate full aquatic sessions consistently without increase in pain and with improving function to demonstrate good toleration and effectiveness of intervention.  Baseline: Goal status: INITIAL  2.  Pt will return to [facility] water classes a [facility] Baseline:  Goal status: INITIAL  3.  Pt will report reduction of general pain while submerged by 50% to demonstrate water properties and effect on pain. Baseline:  Goal status: INITIAL   LONG TERM GOALS: Target date: 04/30/24  Pt to improve on LEFS by at least 9 point to demonstrate statistically significant Improvement in function. Baseline: 24/80 Goal status: INITIAL  2.  Pt will  tolerate standing/walking up to 20 minutes without limitation due to hip Baseline:  Goal status: INITIAL  3.  Pt will  be indep with final HEP's (land and aquatic as appropriate) for continued management of condition Baseline:  Goal status: INITIAL  4.  Pt will report decrease in pain by at least 30% (overall) for improved toleration to activity/quality of life and to demonstrate improved management of pain. Baseline:  Goal status: INITIAL     PLAN:  PT FREQUENCY: 1-2x/week  PT DURATION: 8 weeks extended due to holidays  PLANNED INTERVENTIONS: 97164- PT Re-evaluation, 97750- Physical Performance Testing, 97110-Therapeutic exercises, 97530- Therapeutic activity, [id number]- Neuromuscular re-education, 97535- Self Care, [id number]- Manual therapy, [id number]- Gait training, [phone]- Aquatic Therapy, [phone] (1-2 muscles), [id number] (3+ muscles)- Dry Needling, Patient/Family education, Balance training, Stair training, Taping, Joint mobilization, DME instructions, Cryotherapy, and Moist heat  PLAN FOR NEXT SESSION: aquatics only: general ROM/stretching and strengthening all extremities and core. Pain management.  Return to aquatic exercises (indep or classes); pain management   Ronal Foots) Trisha Ken MPT 03/17/2024 4:12 PM [facility] [address] [place], [place], [address] Phone: [phone]   Fax:  [phone]

## 2024-03-20 NOTE — Telephone Encounter (Signed)
Noted. Will discuss at next OV.

## 2024-03-21 ENCOUNTER — Ambulatory Visit (HOSPITAL_COMMUNITY)
Admission: RE | Admit: 2024-03-21 | Discharge: 2024-03-21 | Disposition: A | Discharge status: Home / Self Care | Source: Ambulatory Visit | Attending: Emergency Medicine | Admitting: Emergency Medicine

## 2024-03-21 ENCOUNTER — Encounter (HOSPITAL_COMMUNITY): Payer: Self-pay

## 2024-03-21 VITALS — BP 132/83 | HR 106 | Temp 98.4°F | Resp 25

## 2024-03-21 DIAGNOSIS — J019 Acute sinusitis, unspecified: Secondary | ICD-10-CM | POA: Diagnosis not present

## 2024-03-21 DIAGNOSIS — B9689 Other specified bacterial agents as the cause of diseases classified elsewhere: Secondary | ICD-10-CM

## 2024-03-21 MED ORDER — DOXYCYCLINE HYCLATE 100 MG PO CAPS: 100.0000 mg | ORAL_CAPSULE | Freq: Two times a day (BID) | ORAL | 0 refills | Status: DC

## 2024-03-21 NOTE — ED Triage Notes (Signed)
 Wed night started having really disgusting snot and difficulty breathing. I stated out with sore throat but dont have that anymore. Took Sudafed, and Nyquil.

## 2024-03-21 NOTE — ED Provider Notes (Signed)
 " [facility]    CSN: [id number] Arrival date & time: 03/21/24  1444      History   Chief Complaint Chief Complaint  Patient presents with   appt     HPI Vicki Rivera is a 48 y.o. female.   Patient presents with nasal congestion, sinus pressure, and sinus headache that began on 12/17.  Patient states that she has had a mild cough as well.  Patient states that she initially had a mild sore throat when symptoms began as well.  Patient denies fever, body aches, chills, chest pain, nausea, vomiting, diarrhea, abdominal pain.  Patient reports that she does have chronic shortness of breath and states that this is not any different than normal.  Patient reports that she does have a history of recurrent sinus infections and feels that this is the same.  Patient states that she normally needs a double round of doxycycline  in order to clear this.  Patient states that she has been using nasal spray, allergy medications, and Sudafed without relief.  The history is provided by the patient and medical records.    Past Medical History:  Diagnosis Date   Allergy    Anxiety    Asthma    Diabetes mellitus without complication (HCC)    DM Type II    Hypertension    Insomnia    Morbid obesity (HCC)    PCOS (polycystic ovarian syndrome)    Sinusitis     Patient Active Problem List   Diagnosis Date Noted   Insulin  dependent type 2 diabetes mellitus (HCC) 02/02/2024   Other dysphagia 01/27/2024   Globus sensation 01/27/2024   Long-term use of high-risk medication 01/27/2024   Chronic cough 01/27/2024   Chronic pain syndrome 01/05/2024   Idiopathic peripheral neuropathy 01/05/2024   OSA on CPAP 01/05/2024   Multiple joint pain 12/26/2023   Type 2 diabetes mellitus with hyperglycemia, without long-term current use of insulin  (HCC) 12/26/2023   Immunization due 12/26/2023   Lower respiratory infection 12/26/2023   Wheezing 12/26/2023   PCOS (polycystic ovarian syndrome)     Morbid obesity (HCC)    Diabetes mellitus without complication (HCC)    Hypertension    Asthma    Hyperlipidemia 07/15/2017   Class 3 severe obesity with serious comorbidity and body mass index (BMI) of 50.0 to 59.9 in adult (HCC) 01/11/2017   Uncontrolled type 2 diabetes mellitus with hyperglycemia, with long-term current use of insulin  (HCC) 06/07/2015    Past Surgical History:  Procedure Laterality Date   CHOLECYSTECTOMY     COLONOSCOPY N/A 01/07/2024   Procedure: COLONOSCOPY;  Surgeon: Kriss Estefana DEL, DO;  Location: [facility];  Service: Gastroenterology;  Laterality: N/A;    OB History   No obstetric history on file.      Home Medications    Prior to Admission medications  Medication Sig Start Date End Date Taking? Authorizing Provider  doxycycline  (VIBRAMYCIN ) 100 MG capsule Take 1 capsule (100 mg total) by mouth 2 (two) times daily. 03/21/24  Yes Johnie, Haward Pope A, NP  albuterol  (PROVENTIL ) (2.5 MG/3ML) 0.083% nebulizer solution Take 3 mLs (2.5 mg total) by nebulization every 6 (six) hours as needed for wheezing or shortness of breath. 12/26/23   Alvia Corean CROME, FNP  albuterol  (VENTOLIN  HFA) 108 (90 Base) MCG/ACT inhaler Inhale 2 puffs into the lungs every 6 (six) hours as needed.     [provider]  Albuterol -Budesonide (AIRSUPRA ) 90-80 MCG/ACT AERO Inhale 2 puffs into the lungs  every 4 (four) hours as needed. 01/27/24   Alvia Corean CROME, FNP  Blood Glucose Monitoring Suppl (ACCU-CHEK GUIDE) w/Device KIT 1 each by Does not apply route daily. 02/10/19   Trixie File, MD  budesonide-formoterol (SYMBICORT) 160-4.5 MCG/ACT inhaler Inhale 2 puffs into the lungs 2 (two) times daily.    [provider]  buPROPion (WELLBUTRIN XL) 300 MG 24 hr tablet Take 300 mg by mouth daily. 09/11/22   [provider]  Continuous Glucose Sensor (DEXCOM G6 SENSOR) MISC Use as instructed, change every 10 days 01/06/24   Trixie File, MD   Continuous Glucose Transmitter (DEXCOM G6 TRANSMITTER) MISC 1 DEVICE BY DOES NOT APPLY ROUTE EVERY 3 (THREE) MONTHS. 03/02/24   Trixie File, MD  desvenlafaxine (PRISTIQ) 100 MG 24 hr tablet TAKE 1 TABLET BY MOUTH EVERY DAY 03/16/24   Alvia Corean CROME, FNP  fluticasone (FLONASE) 50 MCG/ACT nasal spray Place 2 sprays into both nostrils daily.  12/21/14   [provider]  glucose blood (ACCU-CHEK GUIDE) test strip Use to check blood sugars 3 times daily 08/05/17   Trixie File, MD  Insulin  Disposable Pump (OMNIPOD 5 DEXG7G6 PODS GEN 5) MISC Use every 2 days as advised 06/27/23   Trixie File, MD  Insulin  Pen Needle (PEN NEEDLES) 31G X 8 MM MISC Use once every 3 days 09/02/23   Trixie File, MD  insulin  regular human CONCENTRATED (HUMULIN  R U-500 KWIKPEN) 500 UNIT/ML KwikPen INJECT UP TO 300 UNITS DAILY UNDER THE SKIN AS ADVISED 02/10/24   Trixie File, MD  levocetirizine (XYZAL) 5 MG tablet Take 5 mg by mouth every evening.     [provider]  lisinopril-hydrochlorothiazide (PRINZIDE,ZESTORETIC) 10-12.5 MG tablet Take 1 tablet by mouth daily.     [provider]  meloxicam (MOBIC) 15 MG tablet TAKE 1 TABLET BY MOUTH EVERY DAY 03/16/24   Alvia Corean CROME, FNP  montelukast (SINGULAIR) 5 MG chewable tablet Chew 10 mg by mouth at bedtime.     [provider]  [facility] DELICA LANCETS 33G MISC Use to test 3 times daily as instructed. 02/07/16   Trixie File, MD  rosuvastatin (CRESTOR) 20 MG tablet Take 20 mg by mouth daily.     [provider]    Family History Family History  Problem Relation Age of Onset   Hypertension Mother    Diabetes Mother    Hyperlipidemia Mother    Parkinson's disease Mother    Hyperlipidemia Father    Diabetes Sister        TYPE I    Diabetes Maternal Grandmother    Dementia Maternal Grandfather    Heart disease Paternal Grandfather        CAD; MI   Breast cancer Neg Hx     Social  History Social History[1]   Allergies   Amoxicillin-pot clavulanate, Benzonatate, Canagliflozin, Ceftin  [cefuroxime], Ciprofloxacin, Clarithromycin, Emetrol, Metformin, Sulfa antibiotics, and Vraylar [cariprazine hcl]   Review of Systems Review of Systems  Per HPI  Physical Exam Triage Vital Signs ED Triage Vitals  Encounter Vitals Group     BP 03/21/24 1503 132/83     Girls Systolic BP Percentile --      Girls Diastolic BP Percentile --      Boys Systolic BP Percentile --      Boys Diastolic BP Percentile --      Pulse Rate 03/21/24 1503 (!) 106     Resp 03/21/24 1503 (!) 25     Temp 03/21/24 1503 98.4 F (  36.9 C)     Temp Source 03/21/24 1503 Oral     SpO2 03/21/24 1503 98 %     Weight --      Height --      Head Circumference --      Peak Flow --      Pain Score 03/21/24 1502 0     Pain Loc --      Pain Education --      Exclude from Growth Chart --    No data found.  Updated Vital Signs BP 132/83 (BP Location: Left Arm)   Pulse (!) 106   Temp 98.4 F (36.9 C) (Oral)   Resp (!) 25   SpO2 98%   Visual Acuity Right Eye Distance:   Left Eye Distance:   Bilateral Distance:    Right Eye Near:   Left Eye Near:    Bilateral Near:     Physical Exam Vitals and nursing note reviewed.  Constitutional:      General: She is awake. She is not in acute distress.    Appearance: Normal appearance. She is well-developed and well-groomed. She is not ill-appearing.  HENT:     Right Ear: Tympanic membrane, ear canal and external ear normal.     Left Ear: Tympanic membrane, ear canal and external ear normal.     Nose: Congestion and rhinorrhea present.     Right Sinus: Maxillary sinus tenderness present.     Left Sinus: Maxillary sinus tenderness present.     Mouth/Throat:     Mouth: Mucous membranes are moist.     Pharynx: Posterior oropharyngeal erythema present. No oropharyngeal exudate.  Cardiovascular:     Rate and Rhythm: Regular rhythm. Tachycardia present.   Pulmonary:     Effort: Pulmonary effort is normal. Tachypnea present.     Breath sounds: Normal breath sounds.  Skin:    General: Skin is warm and dry.  Neurological:     Mental Status: She is alert.  Psychiatric:        Behavior: Behavior is cooperative.      UC Treatments / Results  Labs (all labs ordered are listed, but only abnormal results are displayed) Labs Reviewed - No data to display  EKG   Radiology No results found.  Procedures Procedures (including critical care time)  Medications Ordered in UC Medications - No data to display  Initial Impression / Assessment and Plan / UC Course  I have reviewed the triage vital signs and the nursing notes.  Pertinent labs & imaging results that were available during my care of the patient were reviewed by me and considered in my medical decision making (see chart for details).     Patient is overall well-appearing.  Vitals are overall stable.  Mild tachycardia and tachypnea present.  Patient reports that this is her baseline as she is chronically short of breath.  Present doxycycline  per patient request for coverage of possible bacterial sinusitis.  Discussed symptomatic treatment.  Discussed follow-up and return precautions. Final Clinical Impressions(s) / UC Diagnoses   Final diagnoses:  Acute bacterial sinusitis     Discharge Instructions      Starting doxycycline  twice daily for 10 days for sinusitis. You can continue using nasal spray and other daily allergy medications to help with your symptoms. Also recommend over-the-counter Coricidin for her symptoms.  This medication is safe with your history of high blood pressure. Follow-up with your primary care provider or return here as needed.   ED Prescriptions  Medication Sig Dispense Auth. Provider   doxycycline  (VIBRAMYCIN ) 100 MG capsule Take 1 capsule (100 mg total) by mouth 2 (two) times daily. 20 capsule Johnie Flaming A, NP      PDMP not  reviewed this encounter.    [1]  Social History Tobacco Use   Smoking status: Never   Smokeless tobacco: Never  Substance Use Topics   Alcohol use: Yes    Alcohol/week: 0.0 standard drinks of alcohol     Johnie Flaming LABOR, NP 03/21/24 1602  "

## 2024-03-21 NOTE — Discharge Instructions (Addendum)
 Starting doxycycline  twice daily for 10 days for sinusitis. You can continue using nasal spray and other daily allergy medications to help with your symptoms. Also recommend over-the-counter Coricidin for her symptoms.  This medication is safe with your history of high blood pressure. Follow-up with your primary care provider or return here as needed.

## 2024-03-23 ENCOUNTER — Other Ambulatory Visit: Payer: Self-pay | Admitting: Medical Genetics

## 2024-03-24 ENCOUNTER — Ambulatory Visit: Admitting: Family Medicine

## 2024-03-24 VITALS — BP 130/60 | HR 106 | Ht 64.0 in

## 2024-03-24 DIAGNOSIS — M25512 Pain in left shoulder: Secondary | ICD-10-CM | POA: Diagnosis not present

## 2024-03-24 DIAGNOSIS — M5416 Radiculopathy, lumbar region: Secondary | ICD-10-CM | POA: Diagnosis not present

## 2024-03-24 DIAGNOSIS — M25511 Pain in right shoulder: Secondary | ICD-10-CM | POA: Diagnosis not present

## 2024-03-24 MED ORDER — GABAPENTIN 300 MG PO CAPS: 300.0000 mg | ORAL_CAPSULE | Freq: Three times a day (TID) | ORAL | 3 refills | Status: AC | PRN

## 2024-03-24 MED ORDER — PREDNISONE 50 MG PO TABS: 50.0000 mg | ORAL_TABLET | Freq: Every day | ORAL | 0 refills | Status: DC

## 2024-03-24 NOTE — Patient Instructions (Addendum)
 Thank you for coming in today.   I've sent a prescription for Gabapentin  and Prednisone  to your pharmacy.  You should hear from MRI scheduling within 1 week. If you do not hear please let me know.    Let's see what the MRI tells us  and plan from there

## 2024-03-24 NOTE — Progress Notes (Signed)
"       ° °  I, Claretha Schimke am a scribe for Dr. Artist Lloyd, MD.  Vicki Rivera is a 48 y.o. female who presents to [facility] at [facility] today for f/u bilat shoulder pain and lumbar radiculopathy. Pt was last seen by Dr. Lloyd on 01/13/24 and labs were obtained and she was referred to PT, completing 5 visits.  Today, pt reports that her leg muscles are hurting today. Follow up with the aquatic therapy. Physical therapy is fine but it isn't improving anything. Legs are tight all the time. When she works them it gets tighter and later in the day they go back down to the tightness that it started with.    Dx testing: 01/13/24 L-spine, and R & L shoulder XR and labs  Pertinent review of systems: No fevers or chills  Relevant historical information: Hypertension.  Diabetes.  Sleep apnea and obesity.   Exam:  BP 130/60   Pulse (!) 106   Ht 5' 4 (1.626 m)   SpO2 98%   BMI 75.18 kg/m  General: Well Developed, well nourished, and in no acute distress.   MSK: L-spine decreased lumbar motion.    EXAM: LUMBAR SPINE - 2-3 VIEW   COMPARISON:  None Available.   FINDINGS: Limited exam because of obese body habitus. Normal alignment and preserved vertebral body heights. Minimal scattered lower thoracic and lumbar endplate bony spurring. Relatively preserved disc spaces. Slight facet arthropathy at L4-5 and L5-S1. No acute osseous finding or compression fracture. Aorta atherosclerotic. SI joints are maintained.   IMPRESSION: Minor degenerative changes as above. No acute finding by plain radiography.     Electronically Signed   By: CHRISTELLA.  Shick M.D.   On: 01/16/2024 13:39  I, Artist Lloyd, personally (independently) visualized and performed the interpretation of the images attached in this note.    Assessment and Plan: 48 y.o. female with bilateral lower extremity pain.  This could be lumbar radiculopathy.  Rheumatologic evaluation was unremarkable.  Plan for MRI  lumbar spine.  Vicki Rivera is quite large.  I do think she is going to fit in the MRI scanner but she might not.  Alternate possibility would be CT myelogram.  For now prescribed gabapentin .  Prednisone  as a backup plan.   PDMP not reviewed this encounter. Orders Placed This Encounter  Procedures   MR LUMBAR SPINE WO CONTRAST    Standing Status:   Future    Expiration Date:   03/24/2025    What is the patient's sedation requirement?:   No Sedation    Does the patient have a pacemaker or implanted devices?:   No    Preferred imaging location?:   [facility] (table limit-550lbs)   Meds ordered this encounter  Medications   gabapentin  (NEURONTIN ) 300 MG capsule    Sig: Take 1-2 capsules (300-600 mg total) by mouth 3 (three) times daily as needed.    Dispense:  90 capsule    Refill:  3   predniSONE  (DELTASONE ) 50 MG tablet    Sig: Take 1 tablet (50 mg total) by mouth daily. If worse.    Dispense:  5 tablet    Refill:  0     Discussed warning signs or symptoms. Please see discharge instructions. Patient expresses understanding.   The above documentation has been reviewed and is accurate and complete Artist Lloyd, M.D.   "

## 2024-03-25 ENCOUNTER — Ambulatory Visit (HOSPITAL_BASED_OUTPATIENT_CLINIC_OR_DEPARTMENT_OTHER): Payer: Self-pay | Admitting: Physical Therapy

## 2024-03-25 ENCOUNTER — Ambulatory Visit (HOSPITAL_BASED_OUTPATIENT_CLINIC_OR_DEPARTMENT_OTHER): Admitting: Physical Therapy

## 2024-03-25 ENCOUNTER — Encounter (HOSPITAL_BASED_OUTPATIENT_CLINIC_OR_DEPARTMENT_OTHER): Payer: Self-pay | Admitting: Physical Therapy

## 2024-03-25 DIAGNOSIS — G8929 Other chronic pain: Secondary | ICD-10-CM

## 2024-03-25 DIAGNOSIS — M5459 Other low back pain: Secondary | ICD-10-CM | POA: Diagnosis not present

## 2024-03-25 DIAGNOSIS — R2689 Other abnormalities of gait and mobility: Secondary | ICD-10-CM

## 2024-03-25 NOTE — Therapy (Signed)
 " OUTPATIENT PHYSICAL THERAPY LOWER EXTREMITY TREATMENT   Patient Name: Vicki Rivera MRN: [id number] DOB:1975-10-17, 48 y.o., female Today's Date: 03/25/2024  END OF SESSION:  PT End of Session - 03/25/24 1136     Visit Number 6    Date for Recertification  04/24/24    Authorization Type aetna    PT Start Time 1142    PT Stop Time 1221    PT Time Calculation (min) 39 min    Activity Tolerance Patient limited by fatigue;Patient limited by pain    Behavior During Therapy [facility] for tasks assessed/performed            Past Medical History:  Diagnosis Date   Allergy    Anxiety    Asthma    Diabetes mellitus without complication (HCC)    DM Type II    Hypertension    Insomnia    Morbid obesity (HCC)    PCOS (polycystic ovarian syndrome)    Sinusitis    Past Surgical History:  Procedure Laterality Date   CHOLECYSTECTOMY     COLONOSCOPY N/A 01/07/2024   Procedure: COLONOSCOPY;  Surgeon: Kriss Estefana DEL, DO;  Location: [facility];  Service: Gastroenterology;  Laterality: N/A;   Patient Active Problem List   Diagnosis Date Noted   Insulin  dependent type 2 diabetes mellitus (HCC) 02/02/2024   Other dysphagia 01/27/2024   Globus sensation 01/27/2024   Long-term use of high-risk medication 01/27/2024   Chronic cough 01/27/2024   Chronic pain syndrome 01/05/2024   Idiopathic peripheral neuropathy 01/05/2024   OSA on CPAP 01/05/2024   Multiple joint pain 12/26/2023   Type 2 diabetes mellitus with hyperglycemia, without long-term current use of insulin  (HCC) 12/26/2023   Immunization due 12/26/2023   Lower respiratory infection 12/26/2023   Wheezing 12/26/2023   PCOS (polycystic ovarian syndrome)    Morbid obesity (HCC)    Diabetes mellitus without complication (HCC)    Hypertension    Asthma    Hyperlipidemia 07/15/2017   Class 3 severe obesity with serious comorbidity and body mass index (BMI) of 50.0 to 59.9 in adult [facility]) 01/11/2017   Uncontrolled type 2  diabetes mellitus with hyperglycemia, with long-term current use of insulin  (HCC) 06/07/2015    PCP: Corean Ku MD  REFERRING PROVIDER: Artist Lloyd MD  REFERRING DIAG:  Other LBP Chronic pain of lower extremity, bilateral Chronic pain both shoulders Other abnormalities of gait and mobility  THERAPY DIAG:  Other low back pain  Chronic pain of lower extremity, bilateral  Chronic pain of both shoulders  Other abnormalities of gait and mobility  Rationale for Evaluation and Treatment: Rehabilitation  ONSET DATE: years/chronic  SUBJECTIVE:   SUBJECTIVE STATEMENT:  I saw Dr Lloyd yesterday and he put me on gabapentin . Pain 4/10 fatigue 9/10.   Initial Subjective I started some kind of flare back in FEB 2025.  I had a flare back in 2019-2020.  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  I do yoga maybe 1 x week which helps for a short while. I am in school and don't have a lot of time.  Am more stiff with less pain as day progresses more movement  more pain  Acupuncture helps the overall pain'. Have massages  PERTINENT HISTORY: Evaluate and Treat for polyarthralgia  1-2 times per week for 4-6 weeks.   Decrease pain, increase strength, flexibility, function, and range of motion.  Modalities may include, traction, ionto, phono, stim, and dry needling  prn. PAIN:  Are you having pain? Yes: NPRS scale: current 3-4/10; worst 8/10; least 3-4/10 Pain location: shoulders, cervical spine arms/hands; hips LB and legs Pain description: stabbing pain, bugs crawling Aggravating factors: life Relieving factors: resting, slow movement; sometime Yoga  PRECAUTIONS: None  RED FLAGS: None   WEIGHT BEARING RESTRICTIONS: No  FALLS:  Has patient fallen in last 6 months? Yes. Number of falls 1 tripped on box  LIVING ENVIRONMENT: Lives with: lives with their spouse and small animals Lives in: House/apartment Stairs: yes Has  following equipment at home: None  OCCUPATION: librarian  PLOF: Independent  PATIENT GOALS: relieve pain, be mobile  NEXT MD VISIT: weeks  OBJECTIVE:  Note: Objective measures were completed at Evaluation unless otherwise noted.  DIAGNOSTIC FINDINGS: Lab and Radiology Results   Right shoulder: Mild AC DJD.  No acute fracture.  No severe glenohumeral DJD.   Left shoulder: Minimal AC DJD.  No acute fracture.  No significant glenohumeral DJD.   Lumbar spine: DDD worse at L4-5 L5-S1.  No acute fractures.  PATIENT SURVEYS:  LEFS: 24/80  COGNITION: Overall cognitive status: Within functional limits for tasks assessed     SENSATION: WFL    POSTURE: rounded shoulders and forward head  PALPATION: Unable due to body habitus  LOWER EXTREMITY ROM:  Wfl. Some limitations due to body habitus  LOWER EXTREMITY MMT:  MMT Right eval Left eval  Hip flexion 4+ 4+  Hip extension    Hip abduction    Hip adduction    Hip internal rotation    Hip external rotation    Knee flexion    Knee extension 5 5  Ankle dorsiflexion    Ankle plantarflexion    Ankle inversion    Ankle eversion     (Blank rows = not tested)   FUNCTIONAL TESTS:  Timed up and go (TUG): 12.50 s  GAIT: Distance walked: 500 ft Assistive device utilized: None Level of assistance: Complete Independence Comments: increased lateral displacement                                                                                                                                 TREATMENT  [facility] Adult PT Treatment:                                                DATE: 03/17/24 Pt seen for aquatic therapy today.  Treatment took place in water 3.5-4.75 ft in depth at the [facility] pool. Temp of water was 91.  Pt entered/exited the pool via stairs using step to pattern with hand rail.   *walking forward, back and side stepping in 4.0 ft with unsupported *standing horizontal add/abd; Bow and  Arrow *supported by yellow noodle forward/backward marching with kick (forward) *white noodle suspended prone->vertical using white noodle:  hip abd/add; hip flex; IT band stretch; cycling *white noodle wrapped posteriorly:cycling  - rest periods throughout session as pt deems necessary.  Pt requires the buoyancy and hydrostatic pressure of water for support, and to offload joints by unweighting joint load by at least 50 % in navel deep water and by at least 75-80% in chest to neck deep water.  Viscosity of the water is needed for resistance of strengthening. Water current perturbations provides challenge to standing balance requiring increased core activation.      PATIENT EDUCATION:  Education details: Discussed eval findings, rehab rationale, aquatic program progression/POC and pools in area. Patient is in agreement  Person educated: Patient Education method: Explanation Education comprehension: verbalized understanding  HOME EXERCISE PROGRAM: Aquatic TBA  ASSESSMENT:  CLINICAL IMPRESSION: Pt continues to report high pain and fatigue level.  Md added gabapentin . Pt may be in a flare. She reports good response from last session without added fatigue. Pain about the same no real changes up to this point.  Aquatics does provide relief today focusing on gentle movement patterns.  Goals ongoing      Initial Impression Patient is a 48 y.o. f who was seen today for physical therapy evaluation and treatment for polyarthralgia. This has been a chronic condition for the past few years with at least 2 flairs.  MD states it is likely she has a potential systemic inflammatory situation rather than discrete musculoskeletal issues. She reports limitation to standing and walking limited by tightness and pain throughout posterior hips and legs. She is unable to keep up her prior active lifestyle and does not tolerate exercise (Pilates/ minimal Yoga). Her goals are to reduce pain and improve overall  function. She is a member here at [facility] but has not attended in a long while.  She is familiar with the [facility] water classes and wants to try to return.  She will benefit from skilled aquatic PT with plan to improve pain/management, increase exercise and activity toleration and improve QOL  OBJECTIVE IMPAIRMENTS: Abnormal gait, cardiopulmonary status limiting activity, decreased activity tolerance, decreased balance, decreased endurance, decreased mobility, difficulty walking, decreased ROM, decreased strength, impaired flexibility, postural dysfunction, obesity, and pain.   ACTIVITY LIMITATIONS: carrying, lifting, bending, sitting, standing, squatting, stairs, transfers, and locomotion level  PARTICIPATION LIMITATIONS: meal prep, cleaning, shopping, community activity, and occupation  PERSONAL FACTORS: Past/current experiences, Time since onset of injury/illness/exacerbation, and 3+ comorbidities: see PmHx are also affecting patient's functional outcome.   REHAB POTENTIAL: Good  CLINICAL DECISION MAKING: Stable/uncomplicated  EVALUATION COMPLEXITY: Low   GOALS: Goals reviewed with patient? Yes  SHORT TERM GOALS: Target date: 03/21/24 Pt will tolerate full aquatic sessions consistently without increase in pain and with improving function to demonstrate good toleration and effectiveness of intervention.  Baseline: Goal status: Met 03/25/24  2.  Pt will return to [facility] water classes a [facility] Baseline:  Goal status: in progress 03/25/24  3.  Pt will report reduction of general pain while submerged by 50% to demonstrate water properties and effect on pain. Baseline:  Goal status: In progress 03/25/24   LONG TERM GOALS: Target date: 04/30/24  Pt to improve on LEFS by at least 9 point to demonstrate statistically significant Improvement in function. Baseline: 24/80 Goal status: INITIAL  2.  Pt will tolerate standing/walking up to 20 minutes without  limitation due to hip Baseline:  Goal status: INITIAL  3.  Pt will be indep with final HEP's (land and aquatic as  appropriate) for continued management of condition Baseline:  Goal status: INITIAL  4.  Pt will report decrease in pain by at least 30% (overall) for improved toleration to activity/quality of life and to demonstrate improved management of pain. Baseline:  Goal status: INITIAL     PLAN:  PT FREQUENCY: 1-2x/week  PT DURATION: 8 weeks extended due to holidays  PLANNED INTERVENTIONS: 97164- PT Re-evaluation, 97750- Physical Performance Testing, 97110-Therapeutic exercises, 97530- Therapeutic activity, [id number]- Neuromuscular re-education, 97535- Self Care, [id number]- Manual therapy, [id number]- Gait training, [id number]- Aquatic Therapy, [id number] (1-2 muscles), 20561 (3+ muscles)- Dry Needling, Patient/Family education, Balance training, Stair training, Taping, Joint mobilization, DME instructions, Cryotherapy, and Moist heat  PLAN FOR NEXT SESSION: aquatics only: general ROM/stretching and strengthening all extremities and core. Pain management.  Return to aquatic exercises (indep or classes); pain management   Ronal Foots) Erina Hamme MPT 03/25/2024 1:06 PM [facility] [address] [place], [place], [address] Phone: [phone]   Fax:  [phone]   "

## 2024-03-30 ENCOUNTER — Ambulatory Visit: Admitting: Family Medicine

## 2024-03-30 ENCOUNTER — Encounter: Payer: Self-pay | Admitting: Family Medicine

## 2024-03-30 ENCOUNTER — Ambulatory Visit: Admitting: Internal Medicine

## 2024-03-30 ENCOUNTER — Encounter: Payer: Self-pay | Admitting: Internal Medicine

## 2024-03-30 VITALS — BP 122/60 | HR 111 | Ht 64.0 in | Wt >= 6400 oz

## 2024-03-30 VITALS — BP 118/74 | HR 102 | Temp 98.0°F | Resp 19 | Ht 65.0 in | Wt >= 6400 oz

## 2024-03-30 DIAGNOSIS — Z79899 Other long term (current) drug therapy: Secondary | ICD-10-CM | POA: Diagnosis not present

## 2024-03-30 DIAGNOSIS — R5383 Other fatigue: Secondary | ICD-10-CM

## 2024-03-30 DIAGNOSIS — E538 Deficiency of other specified B group vitamins: Secondary | ICD-10-CM

## 2024-03-30 DIAGNOSIS — Z794 Long term (current) use of insulin: Secondary | ICD-10-CM

## 2024-03-30 DIAGNOSIS — Z6841 Body Mass Index (BMI) 40.0 and over, adult: Secondary | ICD-10-CM | POA: Diagnosis not present

## 2024-03-30 DIAGNOSIS — E559 Vitamin D deficiency, unspecified: Secondary | ICD-10-CM | POA: Diagnosis not present

## 2024-03-30 DIAGNOSIS — E785 Hyperlipidemia, unspecified: Secondary | ICD-10-CM | POA: Diagnosis not present

## 2024-03-30 DIAGNOSIS — M6281 Muscle weakness (generalized): Secondary | ICD-10-CM

## 2024-03-30 DIAGNOSIS — M791 Myalgia, unspecified site: Secondary | ICD-10-CM | POA: Diagnosis not present

## 2024-03-30 DIAGNOSIS — Z124 Encounter for screening for malignant neoplasm of cervix: Secondary | ICD-10-CM

## 2024-03-30 DIAGNOSIS — G894 Chronic pain syndrome: Secondary | ICD-10-CM | POA: Diagnosis not present

## 2024-03-30 DIAGNOSIS — J4540 Moderate persistent asthma, uncomplicated: Secondary | ICD-10-CM

## 2024-03-30 DIAGNOSIS — E1165 Type 2 diabetes mellitus with hyperglycemia: Secondary | ICD-10-CM | POA: Diagnosis not present

## 2024-03-30 DIAGNOSIS — E66813 Obesity, class 3: Secondary | ICD-10-CM | POA: Diagnosis not present

## 2024-03-30 DIAGNOSIS — M2550 Pain in unspecified joint: Secondary | ICD-10-CM

## 2024-03-30 DIAGNOSIS — E782 Mixed hyperlipidemia: Secondary | ICD-10-CM

## 2024-03-30 LAB — POCT GLYCOSYLATED HEMOGLOBIN (HGB A1C): Hemoglobin A1C: 7.7 % — AB (ref 4.0–5.6)

## 2024-03-30 MED ORDER — ALBUTEROL SULFATE (2.5 MG/3ML) 0.083% IN NEBU: 2.5000 mg | INHALATION_SOLUTION | Freq: Four times a day (QID) | RESPIRATORY_TRACT | 1 refills | Status: AC | PRN

## 2024-03-30 MED ORDER — DEXCOM G7 SENSOR MISC: 3.0000 | 4 refills | Status: AC

## 2024-03-30 NOTE — Patient Instructions (Signed)
 We will be in touch with labs to check for Friday. Hopefully this will help us  decide on how to proceed.  Continue current medication regimen.   Follow up with specialists as scheduled.   Follow-up with me in 6 mos for medication management, sooner if needed.

## 2024-03-30 NOTE — Progress Notes (Unsigned)
 "  Established Patient Office Visit  Subjective:     Patient ID: Vicki Rivera, female    DOB: 09/11/1975, 48 y.o.   MRN: [id number]  Chief Complaint  Patient presents with   Follow-up    F/U Continued worstening of muscle sympyoms.  Wanting testing for lyme disease and long covid    HPI  Discussed the use of AI scribe software for clinical note transcription with the patient, who gave verbal consent to proceed.  History of Present Illness Vicki Rivera is a 48 year old female who presents with worsening chronic fatigue and muscle pain. She is accompanied by her wife, MJ.  Chronic fatigue and myalgia - Marked flare of chronic fatigue and muscle pain since last Tuesday, with severe symptoms through Friday - Fatigue improved by Saturday, but persistent muscle pain and heaviness remain - Muscle pain and fatigue severe enough to require assistance with walking - Even brief walking, such as through a store, triggers fatigue and pain, limiting daily activities - Leg-predominant weakness and heaviness - Intermittent malaise and feverish sensations  Orthostatic symptoms and dyspnea - Dizziness and shortness of breath when standing, somewhat improved since Saturday  Neurological and vascular symptoms - Arm turns purple in certain positions, associated with numbness but no pain  Therapeutic interventions and response - Acupuncture, massage, and aquatic physical therapy without clear benefit - Gabapentin  300 mg taken once without relief; hesitant to increase dose despite physical therapist's suggestion  Migraine history and management - History of migraines, previously improved with yoga - Currently using lifestyle modifications for symptom management  Environmental exposure - 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  Constitutional:      General: She is not in acute distress.    Appearance: She is obese.  HENT:     Head: Normocephalic and atraumatic.     Right Ear: External ear normal.     Left Ear: External ear normal.     Nose: Nose normal.     Mouth/Throat:     Mouth: Mucous membranes are moist.     Pharynx: Oropharynx is clear.  Eyes:     Extraocular Movements: Extraocular movements intact.     Pupils: Pupils are equal, round, and reactive to light.  Cardiovascular:     Rate and Rhythm: Normal rate and regular rhythm.     Pulses: Normal pulses.     Heart sounds: Normal heart sounds.  Pulmonary:     Effort: Pulmonary effort is normal. No respiratory distress.     Breath sounds: Normal breath sounds. No wheezing, rhonchi or rales.  Musculoskeletal:        General: Normal range of motion.     Cervical back: Normal range of motion.     Right lower leg: No edema.     Left lower leg: No edema.  Lymphadenopathy:     Cervical: No cervical adenopathy.  Neurological:     General: No focal deficit present.     Mental Status: She is alert and oriented to person,  place, and time.  Psychiatric:        Mood and Affect: Mood normal.        Thought Content: Thought content normal.     Results for orders placed or performed in visit on 03/30/24  POCT glycosylated hemoglobin (Hb A1C)  Result Value Ref Range   Hemoglobin A1C 7.7 (A) 4.0 - 5.6 %   HbA1c POC (<> result, manual entry)     HbA1c, POC (prediabetic range)     HbA1c, POC (controlled diabetic range)      The ASCVD Risk score (Arnett DK, et al., 2019) failed to calculate for the following reasons:   Cannot find a previous HDL lab   Cannot find a previous total cholesterol lab  BP Readings from Last 3 Encounters:  03/30/24 118/74  03/30/24 122/60  03/24/24 130/60    Wt Readings from Last 3 Encounters:  03/30/24 (!) 442 lb (200.5 kg)  03/30/24 (!) 443 lb (200.9 kg)  01/27/24 (!) 438 lb (198.7 kg)      Last CBC No results found for: WBC, HGB, HCT, MCV, MCH, RDW, PLT Last metabolic panel Lab Results  Component Value Date   GLUCOSE 352 (H) 04/28/2020   NA 132 (L) 04/28/2020   K 4.0 04/28/2020   CL 96 04/28/2020   CO2 29 04/28/2020   BUN 8 04/28/2020   CREATININE 0.80 04/28/2020   GFR 88.07 04/28/2020   CALCIUM 9.3 04/28/2020   PROT 7.2 04/28/2020   ALBUMIN 4.1 04/28/2020   BILITOT 0.7 04/28/2020   ALKPHOS 50 04/28/2020   AST 20 04/28/2020   ALT 19 04/28/2020   Last lipids Lab Results  Component Value Date   CHOL 140 04/28/2020   HDL 38.10 (L) 04/28/2020   LDLDIRECT 83.0 04/28/2020   TRIG 265.0 (H) 04/28/2020   CHOLHDL 4 04/28/2020   Last hemoglobin A1c Lab Results  Component Value Date   HGBA1C 7.7 (A) 03/30/2024   Last thyroid functions No results found for: TSH, T3TOTAL, T4TOTAL, FREET4, THYROIDAB Last vitamin D No results found for: 25OHVITD2, 25OHVITD3, VD25OH Last vitamin B12 and Folate No results found for: VITAMINB12, FOLATE       Assessment & Plan:   Assessment and Plan Assessment & Plan Moderate persistent asthma, uncomplicated Chronic, currently stable Asthma symptoms improved with increased antihistamines. Occasional wheezing and dyspnea persist, possibly related to intercostal muscle issues or pleuritis. - Continue current asthma management with antihistamines and bronchodilators as needed.  Chronic pain syndrome, multiple joint pain, and muscle pain and weakness, chronic fatigue Chronic pain syndrome with worsening muscle pain and fatigue. Differential diagnosis includes Lyme disease, long COVID, Epstein Barr virus, and myositis. Previous treatments ineffective. Gabapentin  trial at 300 mg was ineffective; considering higher dose. MRI scheduled to rule out lumbar issues.  Acupuncture and massage ineffective. Consideration of mobility aids due to fatigue and difficulty with movement. - Sports medicine ordered MRI to evaluate for lumbar issues. - Consider higher dose of gabapentin  on weekends to assess efficacy and side effects. - Ordered Epstein Barr virus panel and consider testing for Lyme disease and long COVID. - Ordered Sjogren's and myositis antibodies. - Consider prostaglandin testing. - Discussed potential use of mobility aids such as a rollator or shower chair. - Continue physical therapy and acupuncture as tolerated.  Type 2 Diabetes Mellitus with hyperglycemia, with long term use of insulin  - CGM with insulin  pump - managed by Dr. Irean with endocrinology - chronic, stable  Mixed Hyperlipidemia Chronic requiring ongoing mgt - lipids today -  stable with rosuvastatin  Vitamin D Deficiency Chronic - vit D levels today  Vit B12 Deficiency Chronic -Vit B12 levels today  Cervical cancer screening Routine health maintenance discussed. Transition to new gynecologist at [facility] for ongoing care.  Medication Management - labs today, will dose adjust medications as indicated      Orders Placed This Encounter  Procedures   Lyme Disease Serology w/Reflex    Standing Status:   Future    Expected Date:   03/30/2024    Expiration Date:   03/30/2025   Epstein-Barr virus VCA antibody panel    Standing Status:   Future    Expiration Date:   03/30/2025   SARS-CoV-2 Antibodies    Standing Status:   Future    Expiration Date:   03/30/2025    Previously tested for COVID-19:   No    Resident in a congregate (group) care setting:   No    Employed in healthcare setting:   No    Pregnant:   No   Rocky mtn spotted fvr abs pnl(IgG+IgM)    Standing Status:   Future    Expiration Date:   03/31/2025   CK (Creatine Kinase)    Standing Status:   Future    Expiration Date:   03/31/2025   Gamma GT    Standing Status:   Future     Expiration Date:   03/31/2025   CBC with Differential/Platelet    Standing Status:   Future    Expiration Date:   03/31/2025    Release to patient:   Immediate [1]   Comprehensive metabolic panel with GFR    Standing Status:   Future    Expiration Date:   03/31/2025    Release to patient:   Immediate [1]   Lipid panel    Standing Status:   Future    Expiration Date:   03/31/2025   Vitamin B12    Standing Status:   Future    Expiration Date:   03/31/2025   VITAMIN D 25 Hydroxy (Vit-D Deficiency, Fractures)    Standing Status:   Future    Expiration Date:   03/31/2025   Thyroid Cascade Profile    Standing Status:   Future    Expiration Date:   03/31/2025   Myositis Assessr Plus Jo-1 Autoabs    Standing Status:   Future    Expiration Date:   03/31/2025   Ambulatory referral to Obstetrics / Gynecology    Referral Priority:   Routine    Referral Type:   Consultation    Referral Reason:   Specialty Services Required    Requested Specialty:   Obstetrics and Gynecology    Number of Visits Requested:   1     Meds ordered this encounter  Medications   albuterol  (PROVENTIL ) (2.5 MG/3ML) 0.083% nebulizer solution    Sig: Take 3 mLs (2.5 mg total) by nebulization every 6 (six) hours as needed for wheezing or shortness of breath.    Dispense:  150 mL    Refill:  1    Return if symptoms worsen or fail to improve.  Corean LITTIE Ku, FNP   "

## 2024-03-30 NOTE — Patient Instructions (Addendum)
 Please use the following pump settings: - basal rates: 12 am: 1 unit/h 5 am: 1.15 units/h Max basal rate: 1.3 >> 2.3 - ICR: 1:1  - coffee: 6-8 units - lunch: 8-10 units - dinner: 10-14 units - target:  6 am: 120 - ISF: 16 - Insulin  on Board: 6h - max basal rate 1.3  Bolus 30-45 min before the meal.  Please return in 4-6 months.

## 2024-03-30 NOTE — Progress Notes (Addendum)
 Patient ID: Vicki Rivera, female   DOB: 11/12/1975, 48 y.o.   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 Last appt 5 months ago.  Interim history: No increased urination, nausea, chest pain.  She does have blurry vision. She started Pilates 2 weeks prior to our last visit.  She enjoyed this and felt that this was helping, however, since last visit, she was advised to stop it as she is investigated for fibromyalgia and mast cell activation syndrome.  However, she started water aerobics since then.  Reviewed HbA1c levels: Lab Results  Component Value Date   HGBA1C 7.9 (A) 10/17/2023   HGBA1C 8.6 (A) 04/19/2023   HGBA1C 8.6 10/17/2022   HGBA1C 8.0 (A) 02/06/2022   HGBA1C 8.7 (A) 03/28/2021   HGBA1C 9.1 (A) 01/10/2021   HGBA1C 9.6 (A) 09/01/2020   HGBA1C 10.7 (A) 04/28/2020   HGBA1C 9.6 (A) 01/19/2020   HGBA1C 10.0 (A) 06/10/2019   HGBA1C 10.7 (A) 02/10/2019   HGBA1C 10.3 (A) 10/10/2018   HGBA1C 9.8 (A) 06/10/2018   HGBA1C 9.0 (A) 10/15/2017   HGBA1C 9.0 07/15/2017   HGBA1C 9.3 04/16/2017   HGBA1C 7.6 02/07/2016   HGBA1C 9.3 06/07/2015  10/17/2022: HbA1c 8.6% 12/04/2016: HbA1c 10.1% 09/12/2015: HbA1c 10.6% 12/21/2014: 7.1%  Previously on: - Basaglar  >> U200 Tresiba  30-34 units in a.m. and 40 units at night - Humalog  26-34 units before each meal, 3x a day  She tried Victoza >> N/V/AP and pancreatitis reportedly She tried Trulicity 0.75 mg weekly >> N/V She tried Invokana >> AP and severe nausea. She tried Metformin >> severe N/AP/D She tried Basaglar  >> developed a facial rash (but unclear if from the Bai drinks she was drinking then)  At last visit she was on U500 insulin : - 60 units at waking up/40-60 units >> 80 units before B - 100-110 units before L >> 100-120 units before L - 60-70 units  before D >> 70-80 units if exercising in the evening; 100-110 units before D  Insulin  pump: - OmniPod Dash - started 2022 - now OmniPod 5 - started 04/2021 - auto mode  Insulin : - U500  CGM: - Freestyle libre 2 - now Dexcom G6 >> would like to switch to the G7 sensor  Supplies: - [facility] for both  Pump settings: - basal rates: 12 am: 1 unit/h 5 am: 1.15 units/h - ICR: 1:1  - coffee: 6-8 units - lunch: 8-10 units - dinner: 10-12 units >> actually using 8-10 units - target:  6 am: 130 >> 120 - ISF: 16 - Insulin  on Board: 6h - max basal rate 1.3 Bolus 30-45 min before the meal.  TDD from basal insulin : 56% (21.5 units) >> 61% (26 units) TDD from bolus insulin : 44% (16.8 units) >> 39% (17 units)  TDD: Up to 250 units a day Changes the OmniPod every 2 days.  She checks her sugars more than 4 times a day with her Dexcom CGM:   Prev.:   Prev.:  Lowest sugar was  40s - off pump >> .SABRA. 60s >> 65. she has hypoglycemia awareness in the 150s Highest sugar was HI >> ... 330 >> 400 >> 325.  Glucometer: Freestyle >> [employer] >> AccuChek guide  Diet: Mostly plant-based + cheese. She is following an anti-inflammatory diet-LEAP.  -No CKD, last BUN/creatinine: 09/19/2023: BUN 13, GFR 84, glucose  131 Lab Results  Component Value Date   BUN 8 04/28/2020   CREATININE 0.80 04/28/2020   Lab Results  Component Value Date   MICRALBCREAT 3 04/19/2023  On lisinopril.  -+ HL; last set of lipids: 09/19/2023: 150/156/45/78 07/24/2022: 160/278/40/75 Lab Results  Component Value Date   CHOL 140 04/28/2020   HDL 38.10 (L) 04/28/2020   LDLDIRECT 83.0 04/28/2020   TRIG 265.0 (H) 04/28/2020   CHOLHDL 4 04/28/2020  On Crestor 20.  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  - No numbness and tingling in her feet.  Last foot exam 04/19/2023.  She also has HTN, PCOS, anemia -on iron.  ROS: + See HPI  I reviewed pt's medications, allergies, PMH, social hx,  family hx, and changes were documented in the history of present illness. Otherwise, unchanged from my initial visit note.  Past Medical History:  Diagnosis Date   Allergy    Anxiety    Asthma    Diabetes mellitus without complication (HCC)    DM Type II    Hypertension    Insomnia    Morbid obesity (HCC)    PCOS (polycystic ovarian syndrome)    Sinusitis    Past Surgical History:  Procedure Laterality Date   CHOLECYSTECTOMY     COLONOSCOPY N/A 01/07/2024   Procedure: COLONOSCOPY;  Surgeon: Kriss Estefana DEL, DO;  Location: [facility];  Service: Gastroenterology;  Laterality: N/A;   Social History   Social History   Marital Status: Single    Spouse Name: N/A   Number of Children: 0   Social History Main Topics   Smoking status: Never Smoker    Smokeless tobacco: Not on file   Alcohol Use: 0.0 oz/week    0 Standard drinks or equivalent per week   Drug Use: Not on file   Social History Narrative   Single (female partner-8 yrs)   0 children   Exercise- 2 times weekly   Caffeine use - 3-4 servings daily   College Graduate - [address] and [employer] (Master's)    Occup - [employer]   Current Outpatient Medications on File Prior to Visit  Medication Sig Dispense Refill   albuterol  (PROVENTIL ) (2.5 MG/3ML) 0.083% nebulizer solution Take 3 mLs (2.5 mg total) by nebulization every 6 (six) hours as needed for wheezing or shortness of breath. 150 mL 1   albuterol  (VENTOLIN  HFA) 108 (90 Base) MCG/ACT inhaler Inhale 2 puffs into the lungs every 6 (six) hours as needed.      Albuterol -Budesonide (AIRSUPRA ) 90-80 MCG/ACT AERO Inhale 2 puffs into the lungs every 4 (four) hours as needed. 5.9 g 3   Blood Glucose Monitoring Suppl (ACCU-CHEK GUIDE) w/Device KIT 1 each by Does not apply route daily. 1 kit 0   budesonide-formoterol (SYMBICORT) 160-4.5 MCG/ACT inhaler Inhale 2 puffs into the lungs 2 (two) times daily.     buPROPion  (WELLBUTRIN XL) 300 MG 24 hr tablet Take 300 mg by mouth daily.     Continuous Glucose Sensor (DEXCOM G6 SENSOR) MISC Use as instructed, change every 10 days 9 each 3   Continuous Glucose Transmitter (DEXCOM G6 TRANSMITTER) MISC 1 DEVICE BY DOES NOT APPLY ROUTE EVERY 3 (THREE) MONTHS. 1 each 3   desvenlafaxine (PRISTIQ) 100 MG 24 hr tablet TAKE 1 TABLET BY MOUTH EVERY DAY 90 tablet 0   doxycycline  (VIBRAMYCIN ) 100 MG capsule Take 1 capsule (100 mg total) by mouth 2 (two) times daily.  20 capsule 0   fluticasone (FLONASE) 50 MCG/ACT nasal spray Place 2 sprays into both nostrils daily.      gabapentin  (NEURONTIN ) 300 MG capsule Take 1-2 capsules (300-600 mg total) by mouth 3 (three) times daily as needed. 90 capsule 3   glucose blood (ACCU-CHEK GUIDE) test strip Use to check blood sugars 3 times daily 100 each 12   Insulin  Disposable Pump (OMNIPOD 5 DEXG7G6 PODS GEN 5) MISC Use every 2 days as advised 45 each 3   Insulin  Pen Needle (PEN NEEDLES) 31G X 8 MM MISC Use once every 3 days 90 each 4   insulin  regular human CONCENTRATED (HUMULIN  R U-500 KWIKPEN) 500 UNIT/ML KwikPen INJECT UP TO 300 UNITS DAILY UNDER THE SKIN AS ADVISED 60 mL 1   levocetirizine (XYZAL) 5 MG tablet Take 5 mg by mouth every evening.      lisinopril-hydrochlorothiazide (PRINZIDE,ZESTORETIC) 10-12.5 MG tablet Take 1 tablet by mouth daily.      meloxicam (MOBIC) 15 MG tablet TAKE 1 TABLET BY MOUTH EVERY DAY 90 tablet 0   montelukast (SINGULAIR) 5 MG chewable tablet Chew 10 mg by mouth at bedtime.      ONETOUCH DELICA LANCETS 33G MISC Use to test 3 times daily as instructed. 300 each 3   predniSONE  (DELTASONE ) 50 MG tablet Take 1 tablet (50 mg total) by mouth daily. If worse. 5 tablet 0   rosuvastatin (CRESTOR) 20 MG tablet Take 20 mg by mouth daily.      No current facility-administered medications on file prior to visit.   Allergies  Allergen Reactions   Amoxicillin-Pot Clavulanate Hives   Benzonatate Other (See Comments)    Canagliflozin Nausea Only   Ceftin  [Cefuroxime] Hives   Ciprofloxacin Hives   Clarithromycin Other (See Comments)   Emetrol Nausea Only   Metformin Nausea Only   Sulfa Antibiotics Hives   Vraylar [Cariprazine Hcl]     Vision loss, tremors, worsening depression, psychosis   Family History  Problem Relation Age of Onset   Hypertension Mother    Diabetes Mother    Hyperlipidemia Mother    Parkinson's disease Mother    Hyperlipidemia Father    Diabetes Sister        TYPE I    Diabetes Maternal Grandmother    Dementia Maternal Grandfather    Heart disease Paternal Grandfather        CAD; MI   Breast cancer Neg Hx    PE: BP 122/60   Pulse (!) 111   Ht 5' 4 (1.626 m)   Wt (!) 443 lb (200.9 kg)   SpO2 97%   BMI 76.04 kg/m  Wt Readings from Last 15 Encounters:  03/30/24 (!) 443 lb (200.9 kg)  01/27/24 (!) 438 lb (198.7 kg)  01/13/24 (!) 432 lb (196 kg)  12/26/23 (!) 432 lb 12.8 oz (196.3 kg)  10/17/23 (!) 427 lb 6.4 oz (193.9 kg)  04/19/23 (!) 409 lb (185.5 kg)  10/17/22 (!) 402 lb (182.3 kg)  02/06/22 (!) 389 lb 3.2 oz (176.5 kg)  03/28/21 (!) 414 lb 6.4 oz (188 kg)  01/10/21 (!) 420 lb 6.4 oz (190.7 kg)  09/01/20 (!) 421 lb (191 kg)  04/28/20 (!) 416 lb (188.7 kg)  01/19/20 (!) 401 lb (181.9 kg)  06/10/19 (!) 401 lb (181.9 kg)  02/10/19 (!) 394 lb (178.7 kg)    Constitutional: obese, in NAD Eyes: EOMI, no exophthalmos ENT: no thyromegaly, no cervical lymphadenopathy Cardiovascular: Tachycardia, RR, No MRG Respiratory: CTA B  Musculoskeletal: no deformities Skin: no rashes Diabetic Foot Exam - Simple   Simple Foot Form Diabetic Foot exam was performed with the following findings: Yes 03/30/2024 10:35 AM  Visual Inspection No deformities, no ulcerations, no other skin breakdown bilaterally: Yes Sensation Testing Intact to touch and monofilament testing bilaterally: Yes Pulse Check Posterior Tibialis and Dorsalis pulse intact bilaterally: Yes Comments + B  halluceal calluses    ASSESSMENT: 1. DM2, insulin -dependent, uncontrolled, with long term complications - DR  2. HL  3. Obesity class III  PLAN:  1. Patient with uncontrolled, type 2 diabetes, very insulin  resistant, on U-500 concentrated insulin , on the OmniPod insulin  pump started in 2022.  She switched to the OmniPod 5 in 04/2021.  She changes the pods every 2 rather than 3 days.  Her OmniPod pump integrated with the Dexcom CGM. -At last visit, HbA1c was slightly lower, at 7.9%.  Reviewing the CGM trends, sugars appeared to have improved since the previous visit, dropping to the normal range in the second half of the night and then increasing slightly particularly after lunch and then more significantly after dinner.  Sugars after dinner were almost all elevated and they were remaining elevated up until approximately 3 AM when they started to improve.  Upon reviewing her pump downloads, she was still entering only approximately 6 to 8 g of carbs for each meal and we discussed about increasing this for lunch and dinner.  We also lowered her target CBG more.  I introduced the changes into the pump for her. CGM interpretation: -At today's visit, we reviewed her CGM downloads: It appears that 68% of values are in target range (goal >70%), while 32% are higher than 180 (goal <25%), and 0% are lower than 70 (goal <4%).  The calculated average blood sugar is 163.  The projected HbA1c for the next 3 months (GMI) is 7.2%. -Reviewing the CGM trends, sugars appear to be improved, based in a long time.  They are fluctuating within the target range mostly during the day, but do increase after dinner.  In the last 2 weeks the sugars after lunch have improved.  With dinner, she does not always enter carbs into the pump as she forgets.  We discussed about trying her best to remember and enter them.  However, even when entering carbs, she is entering 8 to 10 g, and we discussed about increasing this to 10-14 g.   She is worried about blood sugars dropping overnight but as of now they are staying higher in the target range so we definitely have room to lower them a little.  - At today's visit, we also discussed about increasing her maximum basal rate, which is now too low, at 1.3.  I introduced this into the pump for her: 2.3.  I explained why this was necessary. - I also sent a prescription for the Dexcom G7 to her pharmacy, to switch from the Dexcom G6 which she is currently using - I suggested to:  Patient Instructions  Please use the following pump settings: - basal rates: 12 am: 1 unit/h 5 am: 1.15 units/h Max basal rate: 1.3 >> 2.3 - ICR: 1:1  - coffee: 6-8 units - lunch: 8-10 units - dinner: 10-14 units - target:  6 am: 120 - ISF: 16 - Insulin  on Board: 6h - max basal rate 1.3  Bolus 30-45 min before the meal.  Please return in 4-6 months.  - we checked her HbA1c: 7.7% (lower) - advised to check sugars  at different times of the day - 4x a day, rotating check times - advised for yearly eye exams >> she is UTD - return to clinic in 4-6 months   2. HL - Latest lipid panel was reviewed from 09/2023: Fractions at goal: 150/156/45/78  - she continues on Crestor 20 mg daily without side effects  3. Obesity class III - Unfortunately have to use U-500 insulin , which is weight inducing - She gained 25 pounds before the last 2 visits combined - She gained 16 pounds since last visit - 2 weeks prior to last visit, she started Pilates, which I advised her to continue.  However, she was not able to continue and now doing water aerobics.  Lela Fendt, MD PhD [facility] Endocrinology

## 2024-03-31 ENCOUNTER — Telehealth: Payer: Self-pay

## 2024-03-31 NOTE — Telephone Encounter (Signed)
-----   Message from Corean LITTIE Ku sent at 03/31/2024  8:40 AM EST ----- I ordered lots of labs for her to have drawn on Friday to hopefully lead us  in a direction with helping her symptoms.  I have another idea too. She is taking rosuvastatin, and I'd like her to trial stopping this medication. I know she has been on it for a while, but it seems like when she had the flu in February it tripped a trigger for pain and fatigue. I want her to stop it after she has labs drawn on Friday. Give it about 2 weeks and let me know if this helps the pain and fatigue.

## 2024-03-31 NOTE — Telephone Encounter (Signed)
Spoke with patient, gave verbal understanding.

## 2024-04-03 ENCOUNTER — Encounter (HOSPITAL_BASED_OUTPATIENT_CLINIC_OR_DEPARTMENT_OTHER): Payer: Self-pay | Admitting: Physical Therapy

## 2024-04-03 ENCOUNTER — Other Ambulatory Visit: Payer: Self-pay | Admitting: Family Medicine

## 2024-04-03 ENCOUNTER — Other Ambulatory Visit

## 2024-04-03 ENCOUNTER — Encounter: Payer: Self-pay | Admitting: Family Medicine

## 2024-04-03 ENCOUNTER — Ambulatory Visit (HOSPITAL_BASED_OUTPATIENT_CLINIC_OR_DEPARTMENT_OTHER): Payer: Self-pay | Attending: Family Medicine | Admitting: Physical Therapy

## 2024-04-03 DIAGNOSIS — Z794 Long term (current) use of insulin: Secondary | ICD-10-CM | POA: Diagnosis not present

## 2024-04-03 DIAGNOSIS — G894 Chronic pain syndrome: Secondary | ICD-10-CM | POA: Diagnosis not present

## 2024-04-03 DIAGNOSIS — M2550 Pain in unspecified joint: Secondary | ICD-10-CM

## 2024-04-03 DIAGNOSIS — J4540 Moderate persistent asthma, uncomplicated: Secondary | ICD-10-CM | POA: Diagnosis not present

## 2024-04-03 DIAGNOSIS — G8929 Other chronic pain: Secondary | ICD-10-CM | POA: Insufficient documentation

## 2024-04-03 DIAGNOSIS — M79605 Pain in left leg: Secondary | ICD-10-CM | POA: Diagnosis present

## 2024-04-03 DIAGNOSIS — M25511 Pain in right shoulder: Secondary | ICD-10-CM | POA: Insufficient documentation

## 2024-04-03 DIAGNOSIS — E1165 Type 2 diabetes mellitus with hyperglycemia: Secondary | ICD-10-CM

## 2024-04-03 DIAGNOSIS — E559 Vitamin D deficiency, unspecified: Secondary | ICD-10-CM | POA: Diagnosis not present

## 2024-04-03 DIAGNOSIS — M5459 Other low back pain: Secondary | ICD-10-CM | POA: Insufficient documentation

## 2024-04-03 DIAGNOSIS — M791 Myalgia, unspecified site: Secondary | ICD-10-CM

## 2024-04-03 DIAGNOSIS — M25512 Pain in left shoulder: Secondary | ICD-10-CM | POA: Insufficient documentation

## 2024-04-03 DIAGNOSIS — E538 Deficiency of other specified B group vitamins: Secondary | ICD-10-CM

## 2024-04-03 DIAGNOSIS — E782 Mixed hyperlipidemia: Secondary | ICD-10-CM | POA: Diagnosis not present

## 2024-04-03 DIAGNOSIS — M79604 Pain in right leg: Secondary | ICD-10-CM | POA: Insufficient documentation

## 2024-04-03 DIAGNOSIS — R2689 Other abnormalities of gait and mobility: Secondary | ICD-10-CM | POA: Insufficient documentation

## 2024-04-03 DIAGNOSIS — M6281 Muscle weakness (generalized): Secondary | ICD-10-CM

## 2024-04-03 LAB — COMPREHENSIVE METABOLIC PANEL WITH GFR
ALT: 15 U/L (ref 3–35)
AST: 15 U/L (ref 5–37)
Albumin: 4.3 g/dL (ref 3.5–5.2)
Alkaline Phosphatase: 69 U/L (ref 39–117)
BUN: 15 mg/dL (ref 6–23)
CO2: 26 meq/L (ref 19–32)
Calcium: 9.3 mg/dL (ref 8.4–10.5)
Chloride: 97 meq/L (ref 96–112)
Creatinine, Ser: 0.94 mg/dL (ref 0.40–1.20)
GFR: 70.59 mL/min
Glucose, Bld: 183 mg/dL — ABNORMAL HIGH (ref 70–99)
Potassium: 3.9 meq/L (ref 3.5–5.1)
Sodium: 134 meq/L — ABNORMAL LOW (ref 135–145)
Total Bilirubin: 0.5 mg/dL (ref 0.2–1.2)
Total Protein: 7.6 g/dL (ref 6.0–8.3)

## 2024-04-03 LAB — LIPID PANEL
Cholesterol: 173 mg/dL (ref 28–200)
HDL: 50 mg/dL
LDL Cholesterol: 58 mg/dL (ref 10–99)
NonHDL: 122.74
Total CHOL/HDL Ratio: 3
Triglycerides: 324 mg/dL — ABNORMAL HIGH (ref 10.0–149.0)
VLDL: 64.8 mg/dL — ABNORMAL HIGH (ref 0.0–40.0)

## 2024-04-03 LAB — CBC WITH DIFFERENTIAL/PLATELET
Basophils Absolute: 0.1 K/uL (ref 0.0–0.1)
Basophils Relative: 0.8 % (ref 0.0–3.0)
Eosinophils Absolute: 0.7 K/uL (ref 0.0–0.7)
Eosinophils Relative: 5.9 % — ABNORMAL HIGH (ref 0.0–5.0)
HCT: 37.3 % (ref 36.0–46.0)
Hemoglobin: 12 g/dL (ref 12.0–15.0)
Lymphocytes Relative: 33.3 % (ref 12.0–46.0)
Lymphs Abs: 3.8 K/uL (ref 0.7–4.0)
MCHC: 32.1 g/dL (ref 30.0–36.0)
MCV: 71.8 fl — ABNORMAL LOW (ref 78.0–100.0)
Monocytes Absolute: 0.6 K/uL (ref 0.1–1.0)
Monocytes Relative: 5.2 % (ref 3.0–12.0)
Neutro Abs: 6.2 K/uL (ref 1.4–7.7)
Neutrophils Relative %: 54.8 % (ref 43.0–77.0)
Platelets: 391 K/uL (ref 150.0–400.0)
RBC: 5.19 Mil/uL — ABNORMAL HIGH (ref 3.87–5.11)
RDW: 16.4 % — ABNORMAL HIGH (ref 11.5–15.5)
WBC: 11.3 K/uL — ABNORMAL HIGH (ref 4.0–10.5)

## 2024-04-03 LAB — GAMMA GT: GGT: 27 U/L (ref 7–51)

## 2024-04-03 LAB — VITAMIN B12: Vitamin B-12: 338 pg/mL (ref 211–911)

## 2024-04-03 LAB — CK: Total CK: 165 U/L (ref 17–177)

## 2024-04-03 LAB — VITAMIN D 25 HYDROXY (VIT D DEFICIENCY, FRACTURES): VITD: 43.83 ng/mL (ref 30.00–100.00)

## 2024-04-03 NOTE — Therapy (Signed)
 " OUTPATIENT PHYSICAL THERAPY LOWER EXTREMITY TREATMENT   Patient Name: Vicki Rivera MRN: [id number] DOB:February 28, 1976, 49 y.o., female Today's Date: 04/03/2024  END OF SESSION:  PT End of Session - 04/03/24 1401     Visit Number 7    Date for Recertification  04/24/24    Authorization Type aetna    PT Start Time 1350    PT Stop Time 1428    PT Time Calculation (min) 38 min    Behavior During Therapy WFL for tasks assessed/performed            Past Medical History:  Diagnosis Date   Allergy    Anxiety    Asthma    Diabetes mellitus without complication (HCC)    DM Type II    Hypertension    Insomnia    Morbid obesity (HCC)    PCOS (polycystic ovarian syndrome)    Sinusitis    Past Surgical History:  Procedure Laterality Date   CHOLECYSTECTOMY     COLONOSCOPY N/A 01/07/2024   Procedure: COLONOSCOPY;  Surgeon: Kriss Estefana DEL, DO;  Location: [facility];  Service: Gastroenterology;  Laterality: N/A;   Patient Active Problem List   Diagnosis Date Noted   Insulin  dependent type 2 diabetes mellitus (HCC) 02/02/2024   Other dysphagia 01/27/2024   Globus sensation 01/27/2024   Long-term use of high-risk medication 01/27/2024   Chronic cough 01/27/2024   Chronic pain syndrome 01/05/2024   Idiopathic peripheral neuropathy 01/05/2024   OSA on CPAP 01/05/2024   Multiple joint pain 12/26/2023   Type 2 diabetes mellitus with hyperglycemia, without long-term current use of insulin  (HCC) 12/26/2023   Immunization due 12/26/2023   Lower respiratory infection 12/26/2023   Wheezing 12/26/2023   PCOS (polycystic ovarian syndrome)    Morbid obesity (HCC)    Diabetes mellitus without complication (HCC)    Hypertension    Asthma    Hyperlipidemia 07/15/2017   Class 3 severe obesity with serious comorbidity and body mass index (BMI) of 50.0 to 59.9 in adult [facility]) 01/11/2017   Uncontrolled type 2 diabetes mellitus with hyperglycemia, with long-term current use of insulin  (HCC)  06/07/2015    PCP: Corean Ku MD  REFERRING PROVIDER: Artist Lloyd MD  REFERRING DIAG:  Other LBP Chronic pain of lower extremity, bilateral Chronic pain both shoulders Other abnormalities of gait and mobility  THERAPY DIAG:  Other low back pain  Chronic pain of lower extremity, bilateral  Chronic pain of both shoulders  Other abnormalities of gait and mobility  Rationale for Evaluation and Treatment: Rehabilitation  ONSET DATE: years/chronic  SUBJECTIVE:   SUBJECTIVE STATEMENT:  Pt reports she tried 600 mg of Gabapentin ; helped some with pain, but was still sleepy.  She is getting MRI of lower lumbar tomorrow and having blood drawn today for more testing. Bugs crawling-sensation has stopped since taking antihistamines.    Initial Subjective I started some kind of flare back in FEB 2025.  I had a flare back in 2019-2020.  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  I do yoga maybe 1 x week which helps for a short while. I am in school and don't have a lot of time.  Am more stiff with less pain as day progresses more movement  more pain  Acupuncture helps the overall pain'. Have massages  PERTINENT HISTORY: Evaluate and Treat for polyarthralgia  1-2 times per week for 4-6 weeks.   Decrease pain, increase strength, flexibility, function, and  range of motion.  Modalities may include, traction, ionto, phono, stim, and dry needling prn. PAIN:  Are you having pain? Yes: NPRS scale: current 4/10 Pain location: generalized; legs are worst Pain description: achey,  Aggravating factors: life Relieving factors: resting, slow movement; sometime Yoga  PRECAUTIONS: None  RED FLAGS: None   WEIGHT BEARING RESTRICTIONS: No  FALLS:  Has patient fallen in last 6 months? Yes. Number of falls 1 tripped on box  LIVING ENVIRONMENT: Lives with: lives with their spouse and small animals Lives in: House/apartment Stairs:  yes Has following equipment at home: None  OCCUPATION: librarian  PLOF: Independent  PATIENT GOALS: relieve pain, be mobile  NEXT MD VISIT: weeks  OBJECTIVE:  Note: Objective measures were completed at Evaluation unless otherwise noted.  DIAGNOSTIC FINDINGS: Lab and Radiology Results   Right shoulder: Mild AC DJD.  No acute fracture.  No severe glenohumeral DJD.   Left shoulder: Minimal AC DJD.  No acute fracture.  No significant glenohumeral DJD.   Lumbar spine: DDD worse at L4-5 L5-S1.  No acute fractures.  PATIENT SURVEYS:  LEFS: 24/80  COGNITION: Overall cognitive status: Within functional limits for tasks assessed     SENSATION: WFL    POSTURE: rounded shoulders and forward head  PALPATION: Unable due to body habitus  LOWER EXTREMITY ROM:  Wfl. Some limitations due to body habitus  LOWER EXTREMITY MMT:  MMT Right eval Left eval  Hip flexion 4+ 4+  Hip extension    Hip abduction    Hip adduction    Hip internal rotation    Hip external rotation    Knee flexion    Knee extension 5 5  Ankle dorsiflexion    Ankle plantarflexion    Ankle inversion    Ankle eversion     (Blank rows = not tested)   FUNCTIONAL TESTS:  Timed up and go (TUG): 12.50 s  GAIT: Distance walked: 500 ft Assistive device utilized: None Level of assistance: Complete Independence Comments: increased lateral displacement                                                                                                                                 TREATMENT  [facility] Adult PT Treatment:                                                DATE: 04/03/24 Pt seen for aquatic therapy today.  Treatment took place in water 3.5-4.75 ft in depth at the [facility] pool. Temp of water was 91.  Pt entered/exited the pool via stairs using step to pattern with hand rail.  *walking forward/ backward unsupported, multiple laps, cues for even step length and upright trunk * side marching  with horiz abdct/add * [place] with step back with UE on  rainbow hand floats  * standard stance with shoulder add/abdct with rainbow hand floats x 12 *forward walking kick - one episode of Rt knee popping * short rest break while suspended on white noodle, for lumbar decompression *white noodle wrapped posteriorly:cycling   Pt requires the buoyancy and hydrostatic pressure of water for support, and to offload joints by unweighting joint load by at least 50 % in navel deep water and by at least 75-80% in chest to neck deep water.  Viscosity of the water is needed for resistance of strengthening. Water current perturbations provides challenge to standing balance requiring increased core activation.      PATIENT EDUCATION:  Education details: exercise rationale, progressions, and modifications Person educated: Patient Education method: Explanation Education comprehension: verbalized understanding  HOME EXERCISE PROGRAM: Aquatic TBA  ASSESSMENT:  CLINICAL IMPRESSION: Pt had one instance of popping in Rt knee with forward walking kicks, otherwise pt did well with less rest breaks than in previous visits.  Overall pain level reduced with 75% submersion and gentle movement patterns. Will continue to progress as tolerated. Pt is progressing gradually towards remaining goals. Will await testing results (see subjective).       Initial Impression Patient is a 49 y.o. f who was seen today for physical therapy evaluation and treatment for polyarthralgia. This has been a chronic condition for the past few years with at least 2 flairs.  MD states it is likely she has a potential systemic inflammatory situation rather than discrete musculoskeletal issues. She reports limitation to standing and walking limited by tightness and pain throughout posterior hips and legs. She is unable to keep up her prior active lifestyle and does not tolerate exercise (Pilates/ minimal Yoga). Her goals are to  reduce pain and improve overall function. She is a member here at [facility] but has not attended in a long while.  She is familiar with the Less Pain with [employer] water classes and wants to try to return.  She will benefit from skilled aquatic PT with plan to improve pain/management, increase exercise and activity toleration and improve QOL  OBJECTIVE IMPAIRMENTS: Abnormal gait, cardiopulmonary status limiting activity, decreased activity tolerance, decreased balance, decreased endurance, decreased mobility, difficulty walking, decreased ROM, decreased strength, impaired flexibility, postural dysfunction, obesity, and pain.   ACTIVITY LIMITATIONS: carrying, lifting, bending, sitting, standing, squatting, stairs, transfers, and locomotion level  PARTICIPATION LIMITATIONS: meal prep, cleaning, shopping, community activity, and occupation  PERSONAL FACTORS: Past/current experiences, Time since onset of injury/illness/exacerbation, and 3+ comorbidities: see PmHx are also affecting patient's functional outcome.   REHAB POTENTIAL: Good  CLINICAL DECISION MAKING: Stable/uncomplicated  EVALUATION COMPLEXITY: Low   GOALS: Goals reviewed with patient? Yes  SHORT TERM GOALS: Target date: 03/21/24 Pt will tolerate full aquatic sessions consistently without increase in pain and with improving function to demonstrate good toleration and effectiveness of intervention.  Baseline: Goal status: Met 03/25/24  2.  Pt will return to Less Pain with [employer] water classes at [facility] Baseline:  Goal status: in progress 03/25/24  3.  Pt will report reduction of general pain while submerged by 50% to demonstrate water properties and effect on pain. Baseline:  Goal status: In progress 03/25/24   LONG TERM GOALS: Target date: 04/30/24  Pt to improve on LEFS by at least 9 point to demonstrate statistically significant Improvement in function. Baseline: 24/80 Goal status: INITIAL  2.  Pt will tolerate  standing/walking up to 20 minutes without limitation due to hip Baseline:  Goal status: INITIAL  3.  Pt will be indep with final HEP's (land and aquatic as appropriate) for continued management of condition Baseline:  Goal status: INITIAL  4.  Pt will report decrease in pain by at least 30% (overall) for improved toleration to activity/quality of life and to demonstrate improved management of pain. Baseline:  Goal status: INITIAL     PLAN:  PT FREQUENCY: 1-2x/week  PT DURATION: 8 weeks extended due to holidays  PLANNED INTERVENTIONS: 97164- PT Re-evaluation, 97750- Physical Performance Testing, 97110-Therapeutic exercises, 97530- Therapeutic activity, [id number]- Neuromuscular re-education, 97535- Self Care, 02859- Manual therapy, [id number]- Gait training, [id number]- Aquatic Therapy, [phone] (1-2 muscles), 20561 (3+ muscles)- Dry Needling, Patient/Family education, Balance training, Stair training, Taping, Joint mobilization, DME instructions, Cryotherapy, and Moist heat  PLAN FOR NEXT SESSION: aquatics only: general ROM/stretching and strengthening all extremities and core. Pain management.  Return to aquatic exercises (indep or classes); pain management  Delon Aquas, PTA 04/03/2024 2:37 PM [facility] [address] [place], [place], [address] Phone: [phone]   Fax:  [phone]  "

## 2024-04-04 ENCOUNTER — Ambulatory Visit
Admission: RE | Admit: 2024-04-04 | Discharge: 2024-04-04 | Disposition: A | Source: Ambulatory Visit | Attending: Family Medicine | Admitting: Family Medicine

## 2024-04-04 ENCOUNTER — Encounter: Payer: Self-pay | Admitting: Internal Medicine

## 2024-04-04 DIAGNOSIS — M5416 Radiculopathy, lumbar region: Secondary | ICD-10-CM

## 2024-04-06 ENCOUNTER — Other Ambulatory Visit (HOSPITAL_COMMUNITY): Payer: Self-pay

## 2024-04-06 ENCOUNTER — Telehealth: Payer: Self-pay

## 2024-04-06 ENCOUNTER — Encounter: Payer: Self-pay | Admitting: Family Medicine

## 2024-04-06 DIAGNOSIS — M5416 Radiculopathy, lumbar region: Secondary | ICD-10-CM

## 2024-04-06 LAB — THYROID CASCADE PROFILE: TSH: 2.7 u[IU]/mL (ref 0.450–4.500)

## 2024-04-06 LAB — LYME DISEASE SEROLOGY W/REFLEX: Lyme Total Antibody EIA: NEGATIVE

## 2024-04-06 LAB — EPSTEIN-BARR VIRUS VCA ANTIBODY PANEL
EBV NA IgG: <18 U/mL
EBV VCA IgG: <18 U/mL
EBV VCA IgM: <36 U/mL

## 2024-04-06 LAB — ROCKY MTN SPOTTED FVR ABS PNL(IGG+IGM)
RMSF IgG: NOT DETECTED
RMSF IgM: NOT DETECTED

## 2024-04-06 LAB — SARS-COV-2 ANTIBODIES: SARS-CoV-2 Antibodies: POSITIVE

## 2024-04-06 NOTE — Telephone Encounter (Signed)
 Pt needs PA for Dexcom G7

## 2024-04-06 NOTE — Telephone Encounter (Signed)
 Pharmacy Patient Advocate Encounter   Received notification from Pt Calls Messages that prior authorization for Dexcom G7 sensor is required/requested.   Insurance verification completed.   The patient is insured through [facility].   Per test claim: PA required; PA submitted to above mentioned insurance via Latent Key/confirmation #/EOC [id number] Status is pending

## 2024-04-07 NOTE — Telephone Encounter (Signed)
 Forwarding to Dr. Denyse Amass to review and advise.

## 2024-04-08 ENCOUNTER — Ambulatory Visit: Payer: Self-pay | Admitting: Family Medicine

## 2024-04-08 ENCOUNTER — Encounter (HOSPITAL_BASED_OUTPATIENT_CLINIC_OR_DEPARTMENT_OTHER): Payer: Self-pay | Admitting: Physical Therapy

## 2024-04-08 ENCOUNTER — Ambulatory Visit (HOSPITAL_BASED_OUTPATIENT_CLINIC_OR_DEPARTMENT_OTHER): Admitting: Physical Therapy

## 2024-04-08 DIAGNOSIS — M2550 Pain in unspecified joint: Secondary | ICD-10-CM

## 2024-04-08 DIAGNOSIS — G8929 Other chronic pain: Secondary | ICD-10-CM

## 2024-04-08 DIAGNOSIS — M5459 Other low back pain: Secondary | ICD-10-CM | POA: Diagnosis not present

## 2024-04-08 DIAGNOSIS — M791 Myalgia, unspecified site: Secondary | ICD-10-CM

## 2024-04-08 NOTE — Therapy (Signed)
 " OUTPATIENT PHYSICAL THERAPY LOWER EXTREMITY TREATMENT   Patient Name: Vicki Rivera MRN: [id number] DOB:[birth year], 49 y.o., female Today's Date: 04/08/2024  END OF SESSION:  PT End of Session - 04/08/24 1617     Visit Number 8    Date for Recertification  04/24/24    Authorization Type aetna    PT Start Time 1533    PT Stop Time 1614    PT Time Calculation (min) 41 min    Activity Tolerance Patient tolerated treatment well;Patient limited by fatigue    Behavior During Therapy [facility] for tasks assessed/performed             Past Medical History:  Diagnosis Date   Allergy    Anxiety    Asthma    Diabetes mellitus without complication (HCC)    DM Type II    Hypertension    Insomnia    Morbid obesity (HCC)    PCOS (polycystic ovarian syndrome)    Sinusitis    Past Surgical History:  Procedure Laterality Date   CHOLECYSTECTOMY     COLONOSCOPY N/A 01/07/2024   Procedure: COLONOSCOPY;  Surgeon: Kriss Estefana DEL, DO;  Location: [facility];  Service: Gastroenterology;  Laterality: N/A;   Patient Active Problem List   Diagnosis Date Noted   Insulin  dependent type 2 diabetes mellitus (HCC) 02/02/2024   Other dysphagia 01/27/2024   Globus sensation 01/27/2024   Long-term use of high-risk medication 01/27/2024   Chronic cough 01/27/2024   Chronic pain syndrome 01/05/2024   Idiopathic peripheral neuropathy 01/05/2024   OSA on CPAP 01/05/2024   Multiple joint pain 12/26/2023   Type 2 diabetes mellitus with hyperglycemia, without long-term current use of insulin  (HCC) 12/26/2023   Immunization due 12/26/2023   Lower respiratory infection 12/26/2023   Wheezing 12/26/2023   PCOS (polycystic ovarian syndrome)    Morbid obesity (HCC)    Diabetes mellitus without complication (HCC)    Hypertension    Asthma    Hyperlipidemia 07/15/2017   Class 3 severe obesity with serious comorbidity and body mass index (BMI) of 50.0 to 59.9 in adult [facility]) 01/11/2017   Uncontrolled  type 2 diabetes mellitus with hyperglycemia, with long-term current use of insulin  (HCC) 06/07/2015    PCP: Corean Ku MD  REFERRING PROVIDER: Artist Lloyd MD  REFERRING DIAG:  Other LBP Chronic pain of lower extremity, bilateral Chronic pain both shoulders Other abnormalities of gait and mobility  THERAPY DIAG:  Other low back pain  Chronic pain of lower extremity, bilateral  Rationale for Evaluation and Treatment: Rehabilitation  ONSET DATE: years/chronic  SUBJECTIVE:   SUBJECTIVE STATEMENT:  I just feel crappy.  Will be getting CAT scan couldn't do MRI due to panic.  Had a long covid test come back positive.  Pain today 4/10.  Fatigue 7/10. Have been nauseous all day today and yesterday, worse with exertion.   Initial Subjective I started some kind of flare back in FEB 2025.  I had a flare back in 2019-2020.  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  I do yoga maybe 1 x week which helps for a short while. I am in school and don't have a lot of time.  Am more stiff with less pain as day progresses more movement  more pain  Acupuncture helps the overall pain'. Have massages  PERTINENT HISTORY: Evaluate and Treat for polyarthralgia  1-2 times per week for 4-6 weeks.   Decrease pain, increase strength, flexibility,  function, and range of motion.  Modalities may include, traction, ionto, phono, stim, and dry needling prn. PAIN:  Are you having pain? Yes: NPRS scale: current 4/10 Pain location: generalized; legs are worst Pain description: achey,  Aggravating factors: life Relieving factors: resting, slow movement; sometime Yoga  PRECAUTIONS: None  RED FLAGS: None   WEIGHT BEARING RESTRICTIONS: No  FALLS:  Has patient fallen in last 6 months? Yes. Number of falls 1 tripped on box  LIVING ENVIRONMENT: Lives with: lives with their spouse and small animals Lives in: House/apartment Stairs: yes Has following  equipment at home: None  OCCUPATION: librarian  PLOF: Independent  PATIENT GOALS: relieve pain, be mobile  NEXT MD VISIT: weeks  OBJECTIVE:  Note: Objective measures were completed at Evaluation unless otherwise noted.  DIAGNOSTIC FINDINGS: Lab and Radiology Results   Right shoulder: Mild AC DJD.  No acute fracture.  No severe glenohumeral DJD.   Left shoulder: Minimal AC DJD.  No acute fracture.  No significant glenohumeral DJD.   Lumbar spine: DDD worse at L4-5 L5-S1.  No acute fractures.  PATIENT SURVEYS:  LEFS: 24/80  COGNITION: Overall cognitive status: Within functional limits for tasks assessed     SENSATION: WFL    POSTURE: rounded shoulders and forward head  PALPATION: Unable due to body habitus  LOWER EXTREMITY ROM:  Wfl. Some limitations due to body habitus  LOWER EXTREMITY MMT:  MMT Right eval Left eval  Hip flexion 4+ 4+  Hip extension    Hip abduction    Hip adduction    Hip internal rotation    Hip external rotation    Knee flexion    Knee extension 5 5  Ankle dorsiflexion    Ankle plantarflexion    Ankle inversion    Ankle eversion     (Blank rows = not tested)   FUNCTIONAL TESTS:  Timed up and go (TUG): 12.50 s  GAIT: Distance walked: 500 ft Assistive device utilized: None Level of assistance: Complete Independence Comments: increased lateral displacement                                                                                                                                 TREATMENT  [facility] Adult PT Treatment:                                                DATE: 04/08/24 Pt seen for aquatic therapy today.  Treatment took place in water 3.5-4.75 ft in depth at the [facility] pool. Temp of water was 91.  Pt entered/exited the pool via stairs using step to pattern with hand rail.  *walking forward/ backward unsupported, multiple laps, cues for even step length and upright trunk *white noodle wrapped  posteriorly:cycling * side marching with horiz abdct/add * standard stance with  shoulder add/abdct with rainbow hand floats x 12 *standing ue support wall: SL clam shell; hip circles cw&ccw *Open book R/L x 10  * [place] with step back with UE on rainbow hand floats  *forward walking kick - one episode of Rt knee popping * short rest break while suspended on white noodle, for lumbar decompression    Pt requires the buoyancy and hydrostatic pressure of water for support, and to offload joints by unweighting joint load by at least 50 % in navel deep water and by at least 75-80% in chest to neck deep water.  Viscosity of the water is needed for resistance of strengthening. Water current perturbations provides challenge to standing balance requiring increased core activation.      PATIENT EDUCATION:  Education details: exercise rationale, progressions, and modifications Person educated: Patient Education method: Explanation Education comprehension: verbalized understanding  HOME EXERCISE PROGRAM: Aquatic TBA  ASSESSMENT:  CLINICAL IMPRESSION: Pt arrives feeling unwell (fatigued, nauseous, painful). Session initiated with decompression and walking to reduce joint pain and improve comfort level. She requires multiple rest periods throughout session although does tolerate multiple exercises just with shortened duration. Of note: her head and face flush/red throughout session.  She reports she is not overly warm nor has headache. Pt does have new dx of Long Covid. Pt making slow progress towards goals but continues to put forth good effort.        Initial Impression Patient is a 49 y.o. f who was seen today for physical therapy evaluation and treatment for polyarthralgia. This has been a chronic condition for the past few years with at least 2 flairs.  MD states it is likely she has a potential systemic inflammatory situation rather than discrete musculoskeletal issues. She  reports limitation to standing and walking limited by tightness and pain throughout posterior hips and legs. She is unable to keep up her prior active lifestyle and does not tolerate exercise (Pilates/ minimal Yoga). Her goals are to reduce pain and improve overall function. She is a member here at [employer] but has not attended in a long while.  She is familiar with the Less Pain with [employer] water classes and wants to try to return.  She will benefit from skilled aquatic PT with plan to improve pain/management, increase exercise and activity toleration and improve QOL  OBJECTIVE IMPAIRMENTS: Abnormal gait, cardiopulmonary status limiting activity, decreased activity tolerance, decreased balance, decreased endurance, decreased mobility, difficulty walking, decreased ROM, decreased strength, impaired flexibility, postural dysfunction, obesity, and pain.   ACTIVITY LIMITATIONS: carrying, lifting, bending, sitting, standing, squatting, stairs, transfers, and locomotion level  PARTICIPATION LIMITATIONS: meal prep, cleaning, shopping, community activity, and occupation  PERSONAL FACTORS: Past/current experiences, Time since onset of injury/illness/exacerbation, and 3+ comorbidities: see PmHx are also affecting patient's functional outcome.   REHAB POTENTIAL: Good  CLINICAL DECISION MAKING: Stable/uncomplicated  EVALUATION COMPLEXITY: Low   GOALS: Goals reviewed with patient? Yes  SHORT TERM GOALS: Target date: 03/21/24 Pt will tolerate full aquatic sessions consistently without increase in pain and with improving function to demonstrate good toleration and effectiveness of intervention.  Baseline: Goal status: Met 03/25/24  2.  Pt will return to Less Pain with [employer] water classes at [facility] Baseline:  Goal status: in progress 03/25/24  3.  Pt will report reduction of general pain while submerged by 50% to demonstrate water properties and effect on pain. Baseline:  Goal status: In progress  03/25/24   LONG TERM GOALS: Target date: 04/30/24  Pt to improve  on LEFS by at least 9 point to demonstrate statistically significant Improvement in function. Baseline: 24/80 Goal status: INITIAL  2.  Pt will tolerate standing/walking up to 20 minutes without limitation due to hip Baseline:  Goal status: INITIAL  3.  Pt will be indep with final HEP's (land and aquatic as appropriate) for continued management of condition Baseline:  Goal status: INITIAL  4.  Pt will report decrease in pain by at least 30% (overall) for improved toleration to activity/quality of life and to demonstrate improved management of pain. Baseline:  Goal status: INITIAL     PLAN:  PT FREQUENCY: 1-2x/week  PT DURATION: 8 weeks extended due to holidays  PLANNED INTERVENTIONS: 97164- PT Re-evaluation, 97750- Physical Performance Testing, 97110-Therapeutic exercises, 97530- Therapeutic activity, [id number]- Neuromuscular re-education, 97535- Self Care, 02859- Manual therapy, [id number]- Gait training, [phone]- Aquatic Therapy, [phone] (1-2 muscles), 20561 (3+ muscles)- Dry Needling, Patient/Family education, Balance training, Stair training, Taping, Joint mobilization, DME instructions, Cryotherapy, and Moist heat  PLAN FOR NEXT SESSION: aquatics only: general ROM/stretching and strengthening all extremities and core. Pain management.  Return to aquatic exercises (indep or classes); pain management  Ronal Foots) Jeraline Marcinek MPT 04/08/2024 4:18 PM [facility] [address] [place], [place], [address] Phone: [phone]   Fax:  [phone]   "

## 2024-04-09 NOTE — Progress Notes (Signed)
 Noted, will keep an eye out for myositis results.

## 2024-04-14 ENCOUNTER — Ambulatory Visit (HOSPITAL_BASED_OUTPATIENT_CLINIC_OR_DEPARTMENT_OTHER): Admitting: Physical Therapy

## 2024-04-16 ENCOUNTER — Encounter (HOSPITAL_BASED_OUTPATIENT_CLINIC_OR_DEPARTMENT_OTHER): Payer: Self-pay | Admitting: Physical Therapy

## 2024-04-16 ENCOUNTER — Ambulatory Visit (HOSPITAL_BASED_OUTPATIENT_CLINIC_OR_DEPARTMENT_OTHER): Admitting: Physical Therapy

## 2024-04-16 DIAGNOSIS — R2689 Other abnormalities of gait and mobility: Secondary | ICD-10-CM

## 2024-04-16 DIAGNOSIS — M5459 Other low back pain: Secondary | ICD-10-CM

## 2024-04-16 DIAGNOSIS — G8929 Other chronic pain: Secondary | ICD-10-CM

## 2024-04-16 NOTE — Therapy (Signed)
 " OUTPATIENT PHYSICAL THERAPY LOWER EXTREMITY TREATMENT   Patient Name: Vicki Rivera MRN: [id number] DOB:1976-03-29, 49 y.o., female Today's Date: 04/16/2024  END OF SESSION:  PT End of Session - 04/16/24 1629     Visit Number 9    Date for Recertification  04/24/24    Authorization Type aetna    PT Start Time 1610    PT Stop Time 1648    PT Time Calculation (min) 38 min    Activity Tolerance Patient tolerated treatment well;Patient limited by fatigue    Behavior During Therapy [facility] for tasks assessed/performed             Past Medical History:  Diagnosis Date   Allergy    Anxiety    Asthma    Diabetes mellitus without complication (HCC)    DM Type II    Hypertension    Insomnia    Morbid obesity (HCC)    PCOS (polycystic ovarian syndrome)    Sinusitis    Past Surgical History:  Procedure Laterality Date   CHOLECYSTECTOMY     COLONOSCOPY N/A 01/07/2024   Procedure: COLONOSCOPY;  Surgeon: Kriss Estefana DEL, DO;  Location: [facility];  Service: Gastroenterology;  Laterality: N/A;   Patient Active Problem List   Diagnosis Date Noted   Insulin  dependent type 2 diabetes mellitus (HCC) 02/02/2024   Other dysphagia 01/27/2024   Globus sensation 01/27/2024   Long-term use of high-risk medication 01/27/2024   Chronic cough 01/27/2024   Chronic pain syndrome 01/05/2024   Idiopathic peripheral neuropathy 01/05/2024   OSA on CPAP 01/05/2024   Multiple joint pain 12/26/2023   Type 2 diabetes mellitus with hyperglycemia, without long-term current use of insulin  (HCC) 12/26/2023   Immunization due 12/26/2023   Lower respiratory infection 12/26/2023   Wheezing 12/26/2023   PCOS (polycystic ovarian syndrome)    Morbid obesity (HCC)    Diabetes mellitus without complication (HCC)    Hypertension    Asthma    Hyperlipidemia 07/15/2017   Class 3 severe obesity with serious comorbidity and body mass index (BMI) of 50.0 to 59.9 in adult [facility]) 01/11/2017   Uncontrolled  type 2 diabetes mellitus with hyperglycemia, with long-term current use of insulin  (HCC) 06/07/2015    PCP: Corean Ku MD  REFERRING PROVIDER: Artist Lloyd MD  REFERRING DIAG:  Other LBP Chronic pain of lower extremity, bilateral Chronic pain both shoulders Other abnormalities of gait and mobility  THERAPY DIAG:  Other low back pain  Chronic pain of lower extremity, bilateral  Chronic pain of both shoulders  Other abnormalities of gait and mobility  Rationale for Evaluation and Treatment: Rehabilitation  ONSET DATE: years/chronic  SUBJECTIVE:   SUBJECTIVE STATEMENT:  Pt reports continued elevated pain and fatigue levels.    Fatigue is 8/10.  Still awaiting some blood work tests.    Initial Subjective I started some kind of flare back in FEB 2025.  I had a flare back in 2019-2020.  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  I do yoga maybe 1 x week which helps for a short while. I am in school and don't have a lot of time.  Am more stiff with less pain as day progresses more movement  more pain  Acupuncture helps the overall pain'. Have massages  PERTINENT HISTORY: Evaluate and Treat for polyarthralgia  1-2 times per week for 4-6 weeks.   Decrease pain, increase strength, flexibility, function, and range of motion.  Modalities may  include, traction, ionto, phono, stim, and dry needling prn. PAIN:  Are you having pain? Yes: NPRS scale: current 6/10 Pain location: generalized; legs are worst Pain description: achey,  Aggravating factors: life Relieving factors: resting, slow movement; sometime Yoga  PRECAUTIONS: None  RED FLAGS: None   WEIGHT BEARING RESTRICTIONS: No  FALLS:  Has patient fallen in last 6 months? Yes. Number of falls 1 tripped on box  LIVING ENVIRONMENT: Lives with: lives with their spouse and small animals Lives in: House/apartment Stairs: yes Has following equipment at home:  None  OCCUPATION: librarian  PLOF: Independent  PATIENT GOALS: relieve pain, be mobile  NEXT MD VISIT: weeks  OBJECTIVE:  Note: Objective measures were completed at Evaluation unless otherwise noted.  DIAGNOSTIC FINDINGS: Lab and Radiology Results   Right shoulder: Mild AC DJD.  No acute fracture.  No severe glenohumeral DJD.   Left shoulder: Minimal AC DJD.  No acute fracture.  No significant glenohumeral DJD.   Lumbar spine: DDD worse at L4-5 L5-S1.  No acute fractures.  PATIENT SURVEYS:  LEFS: 24/80  COGNITION: Overall cognitive status: Within functional limits for tasks assessed     SENSATION: WFL    POSTURE: rounded shoulders and forward head  PALPATION: Unable due to body habitus  LOWER EXTREMITY ROM:  Wfl. Some limitations due to body habitus  LOWER EXTREMITY MMT:  MMT Right eval Left eval  Hip flexion 4+ 4+  Hip extension    Hip abduction    Hip adduction    Hip internal rotation    Hip external rotation    Knee flexion    Knee extension 5 5  Ankle dorsiflexion    Ankle plantarflexion    Ankle inversion    Ankle eversion     (Blank rows = not tested)   FUNCTIONAL TESTS:  Timed up and go (TUG): 12.50 s  GAIT: Distance walked: 500 ft Assistive device utilized: None Level of assistance: Complete Independence Comments: increased lateral displacement                                                                                                                                 TREATMENT  [facility] Adult PT Treatment:                                                DATE: 04/16/24 Pt seen for aquatic therapy today.  Treatment took place in water 3.5-4.75 ft in depth at the [facility] pool. Temp of water was 91.  Pt entered/exited the pool via stairs using step to pattern with hand rail.  *walking forward/ backward unsupported, multiple laps * Ai Chi postures 5-10 of each:  contemplating; floating, uplifting, enclosing, folding,  soothing, gathering, freeing  * short rest breaks in squatted position as needed throughout session  Pt requires the buoyancy and hydrostatic pressure of water for support, and to offload joints by unweighting joint load by at least 50 % in navel deep water and by at least 75-80% in chest to neck deep water.  Viscosity of the water is needed for resistance of strengthening. Water current perturbations provides challenge to standing balance requiring increased core activation.      PATIENT EDUCATION:  Education details: exercise rationale, progressions, and modifications Person educated: Patient Education method: Explanation Education comprehension: verbalized understanding  HOME EXERCISE PROGRAM: Aquatic TBA  ASSESSMENT:  CLINICAL IMPRESSION: Pt shown Ai Chi exercises this visit as alternative exercise for more mindful breathing and stress reduction.  Pt had one instance of increased Lt ankle pain when in staggered stance which resolved with change in postion.  Pt reported improvement in pain and energy level at end of session.  Pt making slow progress towards goals but continues to put forth good effort.  Will begin d/c planning next visit.         Initial Impression Patient is a 49 y.o. f who was seen today for physical therapy evaluation and treatment for polyarthralgia. This has been a chronic condition for the past few years with at least 2 flairs.  MD states it is likely she has a potential systemic inflammatory situation rather than discrete musculoskeletal issues. She reports limitation to standing and walking limited by tightness and pain throughout posterior hips and legs. She is unable to keep up her prior active lifestyle and does not tolerate exercise (Pilates/ minimal Yoga). Her goals are to reduce pain and improve overall function. She is a member here at [facility] but has not attended in a long while.  She is familiar with the Less Pain with Slater water classes and  wants to try to return.  She will benefit from skilled aquatic PT with plan to improve pain/management, increase exercise and activity toleration and improve QOL  OBJECTIVE IMPAIRMENTS: Abnormal gait, cardiopulmonary status limiting activity, decreased activity tolerance, decreased balance, decreased endurance, decreased mobility, difficulty walking, decreased ROM, decreased strength, impaired flexibility, postural dysfunction, obesity, and pain.   ACTIVITY LIMITATIONS: carrying, lifting, bending, sitting, standing, squatting, stairs, transfers, and locomotion level  PARTICIPATION LIMITATIONS: meal prep, cleaning, shopping, community activity, and occupation  PERSONAL FACTORS: Past/current experiences, Time since onset of injury/illness/exacerbation, and 3+ comorbidities: see PmHx are also affecting patient's functional outcome.   REHAB POTENTIAL: Good  CLINICAL DECISION MAKING: Stable/uncomplicated  EVALUATION COMPLEXITY: Low   GOALS: Goals reviewed with patient? Yes  SHORT TERM GOALS: Target date: 03/21/24 Pt will tolerate full aquatic sessions consistently without increase in pain and with improving function to demonstrate good toleration and effectiveness of intervention.  Baseline: Goal status: Met 03/25/24  2.  Pt will return to Less Pain with Slater water classes at [facility] Baseline:  Goal status: in progress 03/25/24  3.  Pt will report reduction of general pain while submerged by 50% to demonstrate water properties and effect on pain. Baseline:  Goal status: In progress 03/25/24   LONG TERM GOALS: Target date: 04/30/24  Pt to improve on LEFS by at least 9 point to demonstrate statistically significant Improvement in function. Baseline: 24/80 Goal status: INITIAL  2.  Pt will tolerate standing/walking up to 20 minutes without limitation due to hip Baseline:  Goal status: INITIAL  3.  Pt will be indep with final HEP's (land and aquatic as appropriate) for continued  management of condition Baseline:  Goal status: INITIAL  4.  Pt  will report decrease in pain by at least 30% (overall) for improved toleration to activity/quality of life and to demonstrate improved management of pain. Baseline:  Goal status: INITIAL     PLAN:  PT FREQUENCY: 1-2x/week  PT DURATION: 8 weeks extended due to holidays  PLANNED INTERVENTIONS: 97164- PT Re-evaluation, 97750- Physical Performance Testing, 97110-Therapeutic exercises, 97530- Therapeutic activity, [id number]- Neuromuscular re-education, 97535- Self Care, 02859- Manual therapy, [id number]- Gait training, [id number]- Aquatic Therapy, [phone] (1-2 muscles), 20561 (3+ muscles)- Dry Needling, Patient/Family education, Balance training, Stair training, Taping, Joint mobilization, DME instructions, Cryotherapy, and Moist heat  PLAN FOR NEXT SESSION: aquatics only: general ROM/stretching and strengthening all extremities and core. Pain management.  Return to aquatic exercises (indep or classes); pain management  Delon Aquas, PTA 04/16/24 6:05 PM [facility] [address] [place], [place], [address] Phone: [phone]   Fax:  [phone]  "

## 2024-04-20 ENCOUNTER — Encounter: Payer: Self-pay | Admitting: Family Medicine

## 2024-04-21 ENCOUNTER — Ambulatory Visit (HOSPITAL_BASED_OUTPATIENT_CLINIC_OR_DEPARTMENT_OTHER): Admitting: Physical Therapy

## 2024-04-23 ENCOUNTER — Ambulatory Visit (HOSPITAL_BASED_OUTPATIENT_CLINIC_OR_DEPARTMENT_OTHER): Admitting: Physical Therapy

## 2024-04-23 LAB — MYOMARKER 3 PLUS PROFILE (RDL)
Anti-EJ Ab (RDL): NEGATIVE
Anti-Jo-1 Ab (RDL): <20 U
Anti-Ku Ab (RDL): NEGATIVE
Anti-MDA-5 Ab (CADM-140)(RDL): <20 U
Anti-Mi-2 Ab (RDL): NEGATIVE
Anti-NXP-2 (P140) Ab (RDL): <20 U
Anti-OJ Ab (RDL): NEGATIVE
Anti-PL-12 Ab (RDL: NEGATIVE
Anti-PL-7 Ab (RDL): NEGATIVE
Anti-PM/Scl-100 Ab (RDL): <20 U
Anti-SAE1 Ab, IgG (RDL): <20 U
Anti-SRP Ab (RDL): NEGATIVE
Anti-SS-A 52kD Ab, IgG (RDL): <20 U
Anti-TIF-1gamma Ab (RDL): <20 U
Anti-U1 RNP Ab (RDL): <20 U
Anti-U2 RNP Ab (RDL): NEGATIVE
Anti-U3 RNP (Fibrillarin)(RDL): NEGATIVE

## 2024-04-23 NOTE — Progress Notes (Signed)
 Addressed via MyChart.

## 2024-04-24 NOTE — Discharge Instructions (Signed)

## 2024-04-28 ENCOUNTER — Ambulatory Visit
Admission: RE | Admit: 2024-04-28 | Discharge: 2024-04-28 | Disposition: A | Source: Ambulatory Visit | Attending: Family Medicine | Admitting: Family Medicine

## 2024-04-28 ENCOUNTER — Inpatient Hospital Stay
Admission: RE | Admit: 2024-04-28 | Discharge: 2024-04-28 | Disposition: A | Source: Ambulatory Visit | Attending: Family Medicine | Admitting: Family Medicine

## 2024-04-28 ENCOUNTER — Ambulatory Visit (HOSPITAL_BASED_OUTPATIENT_CLINIC_OR_DEPARTMENT_OTHER): Admitting: Physical Therapy

## 2024-04-28 DIAGNOSIS — M5416 Radiculopathy, lumbar region: Secondary | ICD-10-CM

## 2024-04-28 MED ORDER — IOPAMIDOL (ISOVUE-M 200) INJECTION 41%: 18.0000 mL | Freq: Once | INTRAMUSCULAR | Status: DC

## 2024-04-28 NOTE — Progress Notes (Signed)
 Discussed with pt the inability to do Myelogram at 315 due to weight limits on the procedure table. Pt and wife verbalized understanding and comprehend the need to have at a facility with proper equipment.

## 2024-04-29 NOTE — Addendum Note (Signed)
 Addended by: MARDY LEOTIS RAMAN on: 04/29/2024 07:36 AM   Modules accepted: Orders

## 2024-04-30 ENCOUNTER — Encounter (HOSPITAL_BASED_OUTPATIENT_CLINIC_OR_DEPARTMENT_OTHER): Payer: Self-pay | Admitting: Physical Therapy

## 2024-04-30 ENCOUNTER — Ambulatory Visit (HOSPITAL_BASED_OUTPATIENT_CLINIC_OR_DEPARTMENT_OTHER): Admitting: Physical Therapy

## 2024-04-30 DIAGNOSIS — G8929 Other chronic pain: Secondary | ICD-10-CM

## 2024-04-30 DIAGNOSIS — M5459 Other low back pain: Secondary | ICD-10-CM

## 2024-04-30 NOTE — Therapy (Signed)
 " OUTPATIENT PHYSICAL THERAPY LOWER EXTREMITY TREATMENT Re-cert/DC  PHYSICAL THERAPY DISCHARGE SUMMARY  Visits from Start of Care: 10  Current functional level related to goals / functional outcomes: indep   Remaining deficits: Chronic pain and fatigue   Education / Equipment: Management of condition and HEP   Patient agrees to discharge. Patient goals were partially met. Patient is being discharged due to maximized rehab potential.    Patient Name: Vicki Rivera MRN: [id number] DOB:July 26, 1975, 49 y.o., female Today's Date: 04/30/2024  END OF SESSION:  PT End of Session - 04/30/24 1622     Visit Number 10    Date for Recertification  05/07/24    Authorization Type aetna    PT Start Time 1620    PT Stop Time 1700    PT Time Calculation (min) 40 min    Activity Tolerance Patient tolerated treatment well;Patient limited by fatigue    Behavior During Therapy [facility] for tasks assessed/performed             Past Medical History:  Diagnosis Date   Allergy    Anxiety    Asthma    Diabetes mellitus without complication (HCC)    DM Type II    Hypertension    Insomnia    Morbid obesity (HCC)    PCOS (polycystic ovarian syndrome)    Sinusitis    Past Surgical History:  Procedure Laterality Date   CHOLECYSTECTOMY     COLONOSCOPY N/A 01/07/2024   Procedure: COLONOSCOPY;  Surgeon: Kriss Estefana DEL, DO;  Location: [facility];  Service: Gastroenterology;  Laterality: N/A;   Patient Active Problem List   Diagnosis Date Noted   Insulin  dependent type 2 diabetes mellitus (HCC) 02/02/2024   Other dysphagia 01/27/2024   Globus sensation 01/27/2024   Long-term use of high-risk medication 01/27/2024   Chronic cough 01/27/2024   Chronic pain syndrome 01/05/2024   Idiopathic peripheral neuropathy 01/05/2024   OSA on CPAP 01/05/2024   Multiple joint pain 12/26/2023   Type 2 diabetes mellitus with hyperglycemia, without long-term current use of insulin  (HCC) 12/26/2023    Immunization due 12/26/2023   Lower respiratory infection 12/26/2023   Wheezing 12/26/2023   PCOS (polycystic ovarian syndrome)    Morbid obesity (HCC)    Diabetes mellitus without complication (HCC)    Hypertension    Asthma    Hyperlipidemia 07/15/2017   Class 3 severe obesity with serious comorbidity and body mass index (BMI) of 50.0 to 59.9 in adult [facility]) 01/11/2017   Uncontrolled type 2 diabetes mellitus with hyperglycemia, with long-term current use of insulin  (HCC) 06/07/2015    PCP: Corean Ku MD  REFERRING PROVIDER: Artist Lloyd MD  REFERRING DIAG:  Other LBP Chronic pain of lower extremity, bilateral Chronic pain both shoulders Other abnormalities of gait and mobility  THERAPY DIAG:  Other low back pain  Chronic pain of lower extremity, bilateral  Chronic pain of both shoulders  Rationale for Evaluation and Treatment: Rehabilitation  ONSET DATE: years/chronic  SUBJECTIVE:   SUBJECTIVE STATEMENT:  Pt reports continued elevated pain and fatigue levels.    Fatigue is 6/10.  Pain in LE and variable 6/10   Initial Subjective I started some kind of flare back in FEB 2025.  I had a flare back in 2019-2020.  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  I do yoga maybe 1 x week which helps for a short while. I am in school and don't have a lot of  time.  Am more stiff with less pain as day progresses more movement  more pain  Acupuncture helps the overall pain'. Have massages  PERTINENT HISTORY: Evaluate and Treat for polyarthralgia  1-2 times per week for 4-6 weeks.   Decrease pain, increase strength, flexibility, function, and range of motion.  Modalities may include, traction, ionto, phono, stim, and dry needling prn. PAIN:  Are you having pain? Yes: NPRS scale: current 6/10 Pain location: generalized; legs are worst Pain description: achey,  Aggravating factors: life Relieving factors: resting, slow  movement; sometime Yoga  PRECAUTIONS: None  RED FLAGS: None   WEIGHT BEARING RESTRICTIONS: No  FALLS:  Has patient fallen in last 6 months? Yes. Number of falls 1 tripped on box  LIVING ENVIRONMENT: Lives with: lives with their spouse and small animals Lives in: House/apartment Stairs: yes Has following equipment at home: None  OCCUPATION: librarian  PLOF: Independent  PATIENT GOALS: relieve pain, be mobile  NEXT MD VISIT: weeks  OBJECTIVE:  Note: Objective measures were completed at Evaluation unless otherwise noted.  DIAGNOSTIC FINDINGS: Lab and Radiology Results   Right shoulder: Mild AC DJD.  No acute fracture.  No severe glenohumeral DJD.   Left shoulder: Minimal AC DJD.  No acute fracture.  No significant glenohumeral DJD.   Lumbar spine: DDD worse at L4-5 L5-S1.  No acute fractures.  PATIENT SURVEYS:  LEFS: 24/80  COGNITION: Overall cognitive status: Within functional limits for tasks assessed     SENSATION: WFL    POSTURE: rounded shoulders and forward head  PALPATION: Unable due to body habitus  LOWER EXTREMITY ROM:  Wfl. Some limitations due to body habitus  LOWER EXTREMITY MMT:  MMT Right eval Left eval  Hip flexion 4+ 4+  Hip extension    Hip abduction    Hip adduction    Hip internal rotation    Hip external rotation    Knee flexion    Knee extension 5 5  Ankle dorsiflexion    Ankle plantarflexion    Ankle inversion    Ankle eversion     (Blank rows = not tested)   FUNCTIONAL TESTS:  Timed up and go (TUG): 12.50 s  GAIT: Distance walked: 500 ft Assistive device utilized: None Level of assistance: Complete Independence Comments: increased lateral displacement                                                                                                                                 TREATMENT  [facility] Adult PT Treatment:                                                DATE: 04/16/24 Pt seen for aquatic therapy today.   Treatment took place in water 3.5-4.75 ft in depth at the [facility] pool. Temp  of water was 91.  Pt entered/exited the pool via stairs using step to pattern with hand rail.  *walking forward/ backward unsupported, multiple laps * Ai Chi postures 5-10 of each:  contemplating; floating, uplifting, enclosing, folding, soothing, gathering, freeing  *vertical suspension using white noodle wrapped anteriorly: cycling; hip add/abd * short rest breaks in squatted position as needed throughout session   Pt requires the buoyancy and hydrostatic pressure of water for support, and to offload joints by unweighting joint load by at least 50 % in navel deep water and by at least 75-80% in chest to neck deep water.  Viscosity of the water is needed for resistance of strengthening. Water current perturbations provides challenge to standing balance requiring increased core activation.      PATIENT EDUCATION:  Education details: exercise rationale, progressions, and modifications Person educated: Patient Education method: Explanation Education comprehension: verbalized understanding  HOME EXERCISE PROGRAM: Aquatic TBA  ASSESSMENT:  CLINICAL IMPRESSION: Re-cert for 1 session to re-instruct on Ai Chi exercises and discuss Dc and exercise.  She reports she felt better after last session with completion of Ai Chi.  I offered a copy for her to complete on her own but she reports she found it on line. She has also decided to attend the [facility] Less Pain with Slater (and that type) class on Sunday as her HEP declining a laminated copy for aquatic PT. Most goals not met due to ongoing variable chronic fatigue and pain. She does reports some reduction of pain while submerged but returns immediately one exiting pool with re-emergence of gravity.  She has reached her max potential in setting.  She has pool access right here at [facility] to continue using the properties of water for exercise and pain management. She  is ready for dc.           Initial Impression Patient is a 49 y.o. f who was seen today for physical therapy evaluation and treatment for polyarthralgia. This has been a chronic condition for the past few years with at least 2 flairs.  MD states it is likely she has a potential systemic inflammatory situation rather than discrete musculoskeletal issues. She reports limitation to standing and walking limited by tightness and pain throughout posterior hips and legs. She is unable to keep up her prior active lifestyle and does not tolerate exercise (Pilates/ minimal Yoga). Her goals are to reduce pain and improve overall function. She is a member here at [facility] but has not attended in a long while.  She is familiar with the Less Pain with Slater water classes and wants to try to return.  She will benefit from skilled aquatic PT with plan to improve pain/management, increase exercise and activity toleration and improve QOL  OBJECTIVE IMPAIRMENTS: Abnormal gait, cardiopulmonary status limiting activity, decreased activity tolerance, decreased balance, decreased endurance, decreased mobility, difficulty walking, decreased ROM, decreased strength, impaired flexibility, postural dysfunction, obesity, and pain.   ACTIVITY LIMITATIONS: carrying, lifting, bending, sitting, standing, squatting, stairs, transfers, and locomotion level  PARTICIPATION LIMITATIONS: meal prep, cleaning, shopping, community activity, and occupation  PERSONAL FACTORS: Past/current experiences, Time since onset of injury/illness/exacerbation, and 3+ comorbidities: see PmHx are also affecting patient's functional outcome.   REHAB POTENTIAL: Good  CLINICAL DECISION MAKING: Stable/uncomplicated  EVALUATION COMPLEXITY: Low   GOALS: Goals reviewed with patient? Yes  SHORT TERM GOALS: Target date: 03/21/24 Pt will tolerate full aquatic sessions consistently without increase in pain and with improving function to  demonstrate good toleration and effectiveness of  intervention.  Baseline: Goal status: Met 03/25/24  2.  Pt will return to Less Pain with Slater water classes at [facility] Baseline:  Goal status: in progress 03/25/24; Not met but has intention to start. 04/30/24  3.  Pt will report reduction of general pain while submerged by 50% to demonstrate water properties and effect on pain. Baseline:  Goal status: In progress 03/25/24   LONG TERM GOALS: Target date:05/07/24  Pt to improve on LEFS by at least 9 point to demonstrate statistically significant Improvement in function. Baseline: 24/80 Goal status: INITIAL  2.  Pt will tolerate standing/walking up to 20 minutes without limitation due to hip Baseline:  Goal status: Not met 04/30/24  3.  Pt will be indep with final HEP's (land and aquatic as appropriate) for continued management of condition Baseline:  Goal status: INITIAL  4.  Pt will report decrease in pain by at least 30% (overall) for improved toleration to activity/quality of life and to demonstrate improved management of pain. Baseline:  Goal status: INITIAL     PLAN:  PT FREQUENCY: 1  PT DURATION: 1 week  PLANNED INTERVENTIONS: 97164- PT Re-evaluation, 97750- Physical Performance Testing, 97110-Therapeutic exercises, 97530- Therapeutic activity, [id number]- Neuromuscular re-education, 97535- Self Care, 02859- Manual therapy, [id number]- Gait training, [phone]- Aquatic Therapy, [phone] (1-2 muscles), 20561 (3+ muscles)- Dry Needling, Patient/Family education, Balance training, Stair training, Taping, Joint mobilization, DME instructions, Cryotherapy, and Moist heat  PLAN FOR NEXT SESSION: aquatics only: general ROM/stretching and strengthening all extremities and core. Pain management.  Return to aquatic exercises (indep or classes); pain management  Ronal Kem) Allayah Raineri MPT 04/30/24 5:22 PM [facility] [address] [place],  [place], [address] Phone: [phone]   Fax:  [phone]   "

## 2024-05-06 ENCOUNTER — Emergency Department (HOSPITAL_COMMUNITY)

## 2024-05-06 ENCOUNTER — Encounter: Payer: Self-pay | Admitting: Family Medicine

## 2024-05-06 ENCOUNTER — Emergency Department (HOSPITAL_COMMUNITY)
Admission: EM | Admit: 2024-05-06 | Discharge: 2024-05-06 | Disposition: A | Discharge status: Home / Self Care | Attending: Emergency Medicine | Admitting: Emergency Medicine

## 2024-05-06 DIAGNOSIS — D72829 Elevated white blood cell count, unspecified: Secondary | ICD-10-CM | POA: Insufficient documentation

## 2024-05-06 DIAGNOSIS — Z794 Long term (current) use of insulin: Secondary | ICD-10-CM | POA: Insufficient documentation

## 2024-05-06 DIAGNOSIS — J4541 Moderate persistent asthma with (acute) exacerbation: Secondary | ICD-10-CM

## 2024-05-06 DIAGNOSIS — R0602 Shortness of breath: Secondary | ICD-10-CM | POA: Insufficient documentation

## 2024-05-06 DIAGNOSIS — R058 Other specified cough: Secondary | ICD-10-CM | POA: Insufficient documentation

## 2024-05-06 DIAGNOSIS — J45909 Unspecified asthma, uncomplicated: Secondary | ICD-10-CM | POA: Insufficient documentation

## 2024-05-06 DIAGNOSIS — Z79899 Other long term (current) drug therapy: Secondary | ICD-10-CM | POA: Insufficient documentation

## 2024-05-06 DIAGNOSIS — E119 Type 2 diabetes mellitus without complications: Secondary | ICD-10-CM | POA: Insufficient documentation

## 2024-05-06 DIAGNOSIS — Z7951 Long term (current) use of inhaled steroids: Secondary | ICD-10-CM | POA: Insufficient documentation

## 2024-05-06 DIAGNOSIS — R42 Dizziness and giddiness: Secondary | ICD-10-CM | POA: Insufficient documentation

## 2024-05-06 DIAGNOSIS — I10 Essential (primary) hypertension: Secondary | ICD-10-CM | POA: Insufficient documentation

## 2024-05-06 LAB — CBC WITH DIFFERENTIAL/PLATELET
Abs Immature Granulocytes: 0.08 10*3/uL — ABNORMAL HIGH (ref 0.00–0.07)
Basophils Absolute: 0.1 10*3/uL (ref 0.0–0.1)
Basophils Relative: 1 %
Eosinophils Absolute: 0.7 10*3/uL — ABNORMAL HIGH (ref 0.0–0.5)
Eosinophils Relative: 6 %
HCT: 41 % (ref 36.0–46.0)
Hemoglobin: 12.3 g/dL (ref 12.0–15.0)
Immature Granulocytes: 1 %
Lymphocytes Relative: 32 %
Lymphs Abs: 3.7 10*3/uL (ref 0.7–4.0)
MCH: 22.2 pg — ABNORMAL LOW (ref 26.0–34.0)
MCHC: 30 g/dL (ref 30.0–36.0)
MCV: 74 fL — ABNORMAL LOW (ref 80.0–100.0)
Monocytes Absolute: 0.7 10*3/uL (ref 0.1–1.0)
Monocytes Relative: 6 %
Neutro Abs: 6.4 10*3/uL (ref 1.7–7.7)
Neutrophils Relative %: 54 %
Platelets: 387 10*3/uL (ref 150–400)
RBC: 5.54 MIL/uL — ABNORMAL HIGH (ref 3.87–5.11)
RDW: 15.4 % (ref 11.5–15.5)
WBC: 11.6 10*3/uL — ABNORMAL HIGH (ref 4.0–10.5)
nRBC: 0 % (ref 0.0–0.2)

## 2024-05-06 LAB — COMPREHENSIVE METABOLIC PANEL WITH GFR
ALT: 21 U/L (ref 0–44)
AST: 18 U/L (ref 15–41)
Albumin: 4.2 g/dL (ref 3.5–5.0)
Alkaline Phosphatase: 80 U/L (ref 38–126)
Anion gap: 11 (ref 5–15)
BUN: 14 mg/dL (ref 6–20)
CO2: 26 mmol/L (ref 22–32)
Calcium: 9.8 mg/dL (ref 8.9–10.3)
Chloride: 99 mmol/L (ref 98–111)
Creatinine, Ser: 0.84 mg/dL (ref 0.44–1.00)
GFR, Estimated: >60 mL/min
Glucose, Bld: 114 mg/dL — ABNORMAL HIGH (ref 70–99)
Potassium: 4 mmol/L (ref 3.5–5.1)
Sodium: 136 mmol/L (ref 135–145)
Total Bilirubin: 0.4 mg/dL (ref 0.0–1.2)
Total Protein: 7.7 g/dL (ref 6.5–8.1)

## 2024-05-06 LAB — PRO BRAIN NATRIURETIC PEPTIDE: Pro Brain Natriuretic Peptide: <50 pg/mL

## 2024-05-06 LAB — RESP PANEL BY RT-PCR (RSV, FLU A&B, COVID)  RVPGX2
Influenza A by PCR: NEGATIVE
Influenza B by PCR: NEGATIVE
Resp Syncytial Virus by PCR: NEGATIVE
SARS Coronavirus 2 by RT PCR: NEGATIVE

## 2024-05-06 LAB — HCG, SERUM, QUALITATIVE: Preg, Serum: NEGATIVE

## 2024-05-06 MED ORDER — PREDNISONE 10 MG PO TABS: ORAL_TABLET | ORAL | 0 refills | Status: AC

## 2024-05-06 MED ORDER — IPRATROPIUM-ALBUTEROL 0.5-2.5 (3) MG/3ML IN SOLN: 3.0000 mL | Freq: Four times a day (QID) | RESPIRATORY_TRACT | 0 refills | Status: AC | PRN

## 2024-05-06 NOTE — ED Notes (Signed)
 Pt ambulated with pulse ox, maintained 95% on room air, but became short of breath after a few steps

## 2024-05-06 NOTE — Discharge Instructions (Addendum)
 You were in the emergency department today for concerns of shortness of breath and cough.  Your labs and imaging were thankfully reassuring without any obvious abnormality seen.  I suspect your symptoms are likely due to ongoing issues with your asthma and I started you on a short course of steroids and a breathing treatment called DuoNeb which combines albuterol  with a steroid called the ipratropium.  Please follow-up closely with your primary care provider as well as your allergy clinic.  For concerns of worsening symptoms, return to the emergency department.

## 2024-05-06 NOTE — ED Provider Notes (Signed)
 " [place] EMERGENCY DEPARTMENT AT [facility] Provider Note   CSN: [id number] Arrival date & time: 05/06/24  1127     Patient presents with: Cough, Shortness of Breath, and Generalized Body Aches   Vicki Rivera is a 49 y.o. female.  With pertinent medical history of asthma, hypertension, morbid obesity, type 2 diabetes, OSA using CPAP at night, PCOS.   Patient is here for evaluation of worsening dyspnea on exertion.  Patient is accompanied by her wife today.  Patient reports dyspnea on exertion since summer of last year after her third contraction of COVID.  Over the last 2 to 3 weeks she has been experiencing dyspnea on exertion daily and these episodes have become more severe with longer duration.  She is unable to walk 30 yards or traverse 1 flight of stairs without having to stop and catch her breath, this is abnormal for her.  Yesterday one of these episodes of dyspnea was accompanied by sharp stabbing chest pain that began in her back and came to the substernal region without radiation.  Otherwise shortness of breath is not accompanied by chest pain.  Episodes of shortness of breath are now lasting 10 to 15 minutes worse before they only lasted a few minutes.  Denies unilateral leg swelling or tenderness. Colonoscopy performed in November otherwise no recent medical interventions. Not taking oral contraceptives. She does experience bilateral ankle swelling that improves with elevation.  Only new recent medication is B12 supplement.  Denies fever.  Does report intermittent cough and congestion with rhinorrhea.  Denies rectal bleeding, blood in stool, melena, dysuria, hematuria, or foul-smelling urine.  Reports occasional dizziness with shortness of breath episodes without syncope.  Patient with history of asthma.  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  The history is provided by the patient and the  spouse.  Cough Cough characteristics:  Productive Associated symptoms: shortness of breath   Shortness of Breath Associated symptoms: cough        Prior to Admission medications  Medication Sig Start Date End Date Taking? Authorizing Provider  albuterol  (PROVENTIL ) (2.5 MG/3ML) 0.083% nebulizer solution Take 3 mLs (2.5 mg total) by nebulization every 6 (six) hours as needed for wheezing or shortness of breath. 03/30/24   Alvia Corean CROME, FNP  albuterol  (VENTOLIN  HFA) 108 (90 Base) MCG/ACT inhaler Inhale 2 puffs into the lungs every 6 (six) hours as needed.     [provider]  Albuterol -Budesonide (AIRSUPRA ) 90-80 MCG/ACT AERO Inhale 2 puffs into the lungs every 4 (four) hours as needed. 01/27/24   Alvia Corean CROME, FNP  Blood Glucose Monitoring Suppl (ACCU-CHEK GUIDE) w/Device KIT 1 each by Does not apply route daily. 02/10/19   Trixie File, MD  budesonide-formoterol (SYMBICORT) 160-4.5 MCG/ACT inhaler Inhale 2 puffs into the lungs 2 (two) times daily.    [provider]  buPROPion (WELLBUTRIN XL) 300 MG 24 hr tablet Take 300 mg by mouth daily. 09/11/22   [provider]  Continuous Glucose Sensor (DEXCOM G7 SENSOR) MISC 3 each by Does not apply route every 30 (thirty) days. Apply 1 sensor every 10 days 03/30/24   Trixie File, MD  desvenlafaxine (PRISTIQ) 100 MG 24 hr tablet TAKE 1 TABLET BY MOUTH EVERY DAY 03/16/24   Alvia Corean CROME, FNP  fluticasone (FLONASE) 50 MCG/ACT nasal spray Place 2 sprays into both nostrils daily.  12/21/14   [provider]  gabapentin  (NEURONTIN ) 300  MG capsule Take 1-2 capsules (300-600 mg total) by mouth 3 (three) times daily as needed. 03/24/24   Joane Artist RAMAN, MD  glucose blood (ACCU-CHEK GUIDE) test strip Use to check blood sugars 3 times daily 08/05/17   Trixie File, MD  Insulin  Disposable Pump (OMNIPOD 5 DEXG7G6 PODS GEN 5) MISC Use every 2 days as advised 06/27/23   Trixie File, MD   Insulin  Pen Needle (PEN NEEDLES) 31G X 8 MM MISC Use once every 3 days 09/02/23   Trixie File, MD  insulin  regular human CONCENTRATED (HUMULIN  R U-500 KWIKPEN) 500 UNIT/ML KwikPen INJECT UP TO 300 UNITS DAILY UNDER THE SKIN AS ADVISED 02/10/24   Trixie File, MD  levocetirizine (XYZAL) 5 MG tablet Take 5 mg by mouth every evening.     [provider]  lisinopril-hydrochlorothiazide (PRINZIDE,ZESTORETIC) 10-12.5 MG tablet Take 1 tablet by mouth daily.     [provider]  meloxicam (MOBIC) 15 MG tablet TAKE 1 TABLET BY MOUTH EVERY DAY 03/16/24   Alvia Corean CROME, FNP  montelukast (SINGULAIR) 5 MG chewable tablet Chew 10 mg by mouth at bedtime.     [provider]  [facility] DELICA LANCETS 33G MISC Use to test 3 times daily as instructed. 02/07/16   Trixie File, MD  predniSONE  (DELTASONE ) 50 MG tablet Take 1 tablet (50 mg total) by mouth daily. If worse. 03/24/24   Corey, Evan S, MD  rosuvastatin (CRESTOR) 20 MG tablet Take 20 mg by mouth daily.     [provider]  Vitamin D , Ergocalciferol , (DRISDOL) 1.25 MG (50000 UNIT) CAPS capsule Take 50,000 Units by mouth 2 (two) times a week. 03/10/24   [provider]    Allergies: Amoxicillin-pot clavulanate, Benzonatate, Canagliflozin, Ceftin  [cefuroxime], Ciprofloxacin, Clarithromycin, Emetrol, Metformin, Sulfa antibiotics, and Vraylar [cariprazine hcl]    Review of Systems  Respiratory:  Positive for cough and shortness of breath.    Vitals:   05/06/24 1135 05/06/24 1535  BP: (!) 121/98 (!) 156/92  Pulse: 94 100  Resp: 20 20  Temp: 98 F (36.7 C)   TempSrc: Oral   SpO2: 99% 95%    Updated Vital Signs BP (!) 156/92   Pulse 100   Temp 98 F (36.7 C) (Oral)   Resp 20   SpO2 95%   Physical Exam Vitals and nursing note reviewed.  Constitutional:      General: She is not in acute distress.    Appearance: Normal appearance. She is well-developed. She is obese. She is not  ill-appearing, toxic-appearing or diaphoretic.  HENT:     Head: Normocephalic and atraumatic.     Mouth/Throat:     Mouth: Mucous membranes are moist.  Eyes:     General: No scleral icterus.    Extraocular Movements: Extraocular movements intact.     Conjunctiva/sclera: Conjunctivae normal.  Cardiovascular:     Rate and Rhythm: Normal rate and regular rhythm.     Pulses: Normal pulses.     Heart sounds: Normal heart sounds.  Pulmonary:     Effort: Pulmonary effort is normal. No tachypnea, accessory muscle usage or respiratory distress.     Breath sounds: Normal breath sounds. No stridor. No decreased breath sounds, wheezing, rhonchi or rales.  Chest:     Chest wall: No tenderness.  Abdominal:     General: Abdomen is flat. Bowel sounds are normal. There is no distension.     Palpations: Abdomen is soft.     Tenderness: There is no abdominal tenderness.  There is no guarding.     Comments: Exam limited due to body habitus  Musculoskeletal:        General: Normal range of motion.     Cervical back: Normal range of motion.     Right lower leg: No tenderness. No edema.     Left lower leg: No tenderness. No edema.  Lymphadenopathy:     Cervical: No cervical adenopathy.  Skin:    General: Skin is warm and dry.     Capillary Refill: Capillary refill takes less than 2 seconds.     Coloration: Skin is not jaundiced or pale.  Neurological:     Mental Status: She is alert and oriented to person, place, and time.     (all labs ordered are listed, but only abnormal results are displayed) Labs Reviewed  CBC WITH DIFFERENTIAL/PLATELET - Abnormal; Notable for the following components:      Result Value   WBC 11.6 (*)    RBC 5.54 (*)    MCV 74.0 (*)    MCH 22.2 (*)    Eosinophils Absolute 0.7 (*)    Abs Immature Granulocytes 0.08 (*)    All other components within normal limits  COMPREHENSIVE METABOLIC PANEL WITH GFR - Abnormal; Notable for the following components:   Glucose, Bld 114  (*)    All other components within normal limits  RESP PANEL BY RT-PCR (RSV, FLU A&B, COVID)  RVPGX2  PRO BRAIN NATRIURETIC PEPTIDE  HCG, SERUM, QUALITATIVE    EKG: None  Radiology: CT Chest Wo Contrast Result Date: 05/06/2024 EXAM: CT CHEST WITHOUT CONTRAST 05/06/2024 03:13:50 PM TECHNIQUE: CT of the chest was performed without the administration of intravenous contrast. Multiplanar reformatted images are provided for review. Automated exposure control, iterative reconstruction, and/or weight based adjustment of the mA/kV was utilized to reduce the radiation dose to as low as reasonably achievable. COMPARISON: Chest radiograph performed earlier the same date. CLINICAL HISTORY: Dyspnea, chronic, unclear etiology. FINDINGS: MEDIASTINUM: Heart and pericardium are unremarkable. The central airways are clear. LYMPH NODES: No mediastinal, hilar or axillary lymphadenopathy. LUNGS AND PLEURA: No focal consolidation or pulmonary edema. No pleural effusion or pneumothorax. SOFT TISSUES/BONES: Multilevel degenerative disc disease of the spine. No acute abnormality of the soft tissues. UPPER ABDOMEN: Limited images of the upper abdomen demonstrates no acute abnormality. IMPRESSION: 1. No acute intrathoracic abnormality. No pneumonia, pulmonary edema, or pleural effusion. Electronically signed by: Rogelia Myers MD 05/06/2024 03:27 PM EST RP Workstation: [id number]   DG Chest 2 View Result Date: 05/06/2024 CLINICAL DATA:  Shortness of breath and cough EXAM: CHEST - 2 VIEW COMPARISON:  06/08/2014 FINDINGS: Limited exam because of technique and body habitus. Prominent heart size related to technique. No severe focal pneumonia or CHF pattern. No large effusion or pneumothorax. No significant collapse or consolidation. Trachea midline. Endplate degenerative changes throughout the thoracic spine. No severe compression fracture. IMPRESSION: Limited exam. No acute chest process. Electronically Signed   By: CHRISTELLA.  Shick M.D.    On: 05/06/2024 12:35    Procedures   Medications Ordered in the ED - No data to display   Patient presents to the ED for concern of worsening dyspnea with exertion, this involves an extensive number of treatment options, and is a complaint that carries with it a high risk of complications and morbidity.  The differential diagnosis includes ACS, PE, asthma exacerbation, CHF, pneumonia, viral URI, electrolyte derangement, anemia, metabolic disturbance.   Co morbidities that complicate the patient evaluation  Asthma Morbid obesity  Type 2 diabetes Hypertension PCOS   Additional history obtained:  Additional history obtained from  Family and Outside Medical Records   External records from outside source obtained and reviewed including additional history provided by spouse at bedside and review of previous labs to compare.   Lab Tests:  I Ordered, and personally interpreted labs.  The pertinent results include:  Slightly elevated white count of 11.6, comparable to previous labs a month ago.  No anion gap.  Normal liver and renal function. Normal hemoglobin and electrolytes.  Respiratory panel negative.   Imaging Studies ordered:  I ordered imaging studies including chest x-ray  I independently visualized and interpreted imaging which showed exam limited due to technique and body habitus, no acute process seen. I agree with the radiologist interpretation CT chest without contrast pending at provider sign out.   Cardiac Monitoring:  The patient was maintained on a cardiac monitor.  I personally viewed and interpreted the cardiac monitored which showed an underlying rhythm of: EKG pending at provider sign out.   Problem List / ED Course:     Worsening dyspnea on exertion.  Vitals, lab work, and physical exam are reassuring.  Chest x-ray results are limited so CT chest without contrast performed and results pending at provider signout.  EKG results pending at provider signout.   Care transferred to oncoming PA-C.   Reevaluation:  After the interventions noted above, I reevaluated the patient and found that they have :stayed the same   Dispostion:  Care transferred to [facility].                                PERC Score: 1, PERC Score Interpretation: If any criteria are positive, the PERC rule cannot be used to rule out PE in this patient Medical Decision Making Amount and/or Complexity of Data Reviewed Labs: ordered. Radiology: ordered.   This note was produced using Electronics Engineer. While the provider has reviewed and verified all clinical information, transcription errors may remain.    Final diagnoses:  None    ED Discharge Orders     None          Vickki Igou A, PA-C 05/06/24 1555  "

## 2024-05-06 NOTE — ED Provider Notes (Signed)
 Accepted handoff at shift change from [place], PA-C. Please see prior provider note for more detail.   Briefly: Patient is 49 y.o.   DDX: concern for bronchitis, pneumonia, PE, pleural effusion, CHF  Plan: Pending CT chest, EKG, and ambulation.  Physical Exam  BP (!) 141/70   Pulse 97   Temp 98.1 F (36.7 C) (Oral)   Resp (!) 23   SpO2 97%   Physical Exam Vitals and nursing note reviewed.  Constitutional:      General: She is not in acute distress.    Appearance: Normal appearance. She is well-developed. She is obese. She is not ill-appearing, toxic-appearing or diaphoretic.  HENT:     Head: Normocephalic and atraumatic.     Mouth/Throat:     Mouth: Mucous membranes are moist.  Eyes:     General: No scleral icterus.    Extraocular Movements: Extraocular movements intact.     Conjunctiva/sclera: Conjunctivae normal.  Cardiovascular:     Rate and Rhythm: Normal rate and regular rhythm.     Pulses: Normal pulses.     Heart sounds: Normal heart sounds.  Pulmonary:     Effort: Pulmonary effort is normal. No tachypnea, accessory muscle usage or respiratory distress.     Breath sounds: Normal breath sounds. No stridor. No decreased breath sounds, wheezing, rhonchi or rales.  Chest:     Chest wall: No tenderness.  Abdominal:     General: Abdomen is flat. Bowel sounds are normal. There is no distension.     Palpations: Abdomen is soft.     Tenderness: There is no abdominal tenderness. There is no guarding.     Comments: Exam limited due to body habitus  Musculoskeletal:        General: Normal range of motion.     Cervical back: Normal range of motion.     Right lower leg: No tenderness. No edema.     Left lower leg: No tenderness. No edema.  Lymphadenopathy:     Cervical: No cervical adenopathy.  Skin:    General: Skin is warm and dry.     Capillary Refill: Capillary refill takes less than 2 seconds.     Coloration: Skin is not jaundiced or pale.  Neurological:     Mental  Status: She is alert and oriented to person, place, and time.     Procedures  Procedures  ED Course / MDM    Medical Decision Making Amount and/or Complexity of Data Reviewed Labs: ordered. Radiology: ordered.  Risk Prescription drug management.   Patient is a sign out from [place], PA-C. In brief, patient is here with concerns of cough, bodyaches, and shortness of breat. Currently pending CT chest, EKG, and ambulation trial. 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 Symptoms were somewhat reversible earlier with breathing treatment, possibly asthma related? No wheezing on exam from initial PA-C. Will reassess.  Patient was able to tolerate ambulation without any notable hypoxia.  Remained at 95% or higher on room air.  On my reassessment, no appreciable wheezing.  CT chest is negative.  With reassuring findings, I suspect symptoms are likely due to poorly controlled asthma versus obesity hypoventilation syndrome versus other.  Will trial a course of steroids and add on DuoNeb treatments for home use.  Advise close follow-up with PCP for further assessment as well as allergy specialist. Also advised that she may benefit from pulmonology follow-up/consultation.  Return precautions were discussed such as concerns for new or  worsening symptoms.  She is otherwise stable for outpatient follow-up and discharged home.        Aram Domzalski A, PA-C 05/06/24 1638    Rogelia Jerilynn RAMAN, MD 05/06/24 2140

## 2024-05-06 NOTE — ED Triage Notes (Signed)
 Patient reports SOB/cough that started yesterday, neb tx at home helped but did not last. Patient is alert and oriented x 4. Airway patent, respirations even and unlabored. Skin normal, warm and dry.

## 2024-05-06 NOTE — ED Notes (Signed)
 Pt to CT

## 2024-05-11 ENCOUNTER — Inpatient Hospital Stay: Admitting: Family Medicine

## 2024-06-18 ENCOUNTER — Ambulatory Visit

## 2024-09-14 ENCOUNTER — Ambulatory Visit: Admitting: Internal Medicine
